# Patient Record
Sex: Female | Born: 1950 | Race: White | Hispanic: No | Marital: Married | Smoking: Never smoker
Health system: Southern US, Community
[De-identification: ages and names within clinical notes are randomized; demographics above are authoritative.]

## PROBLEM LIST (undated history)

## (undated) DIAGNOSIS — D332 Benign neoplasm of brain, unspecified: Secondary | ICD-10-CM

## (undated) DIAGNOSIS — I509 Heart failure, unspecified: Secondary | ICD-10-CM

## (undated) DIAGNOSIS — I252 Old myocardial infarction: Secondary | ICD-10-CM

## (undated) DIAGNOSIS — E119 Type 2 diabetes mellitus without complications: Secondary | ICD-10-CM

## (undated) DIAGNOSIS — F419 Anxiety disorder, unspecified: Secondary | ICD-10-CM

## (undated) DIAGNOSIS — F32A Depression, unspecified: Secondary | ICD-10-CM

## (undated) DIAGNOSIS — E559 Vitamin D deficiency, unspecified: Secondary | ICD-10-CM

## (undated) DIAGNOSIS — N183 Chronic kidney disease, stage 3 unspecified: Secondary | ICD-10-CM

## (undated) HISTORY — DX: Chronic kidney disease, stage 3 unspecified: N18.30

## (undated) HISTORY — PX: OTHER SURGICAL HISTORY: SHX169

## (undated) HISTORY — DX: Depression, unspecified: F32.A

## (undated) HISTORY — DX: Anxiety disorder, unspecified: F41.9

## (undated) HISTORY — DX: Old myocardial infarction: I25.2

## (undated) HISTORY — DX: Vitamin D deficiency, unspecified: E55.9

## (undated) HISTORY — DX: Type 2 diabetes mellitus without complications: E11.9

## (undated) HISTORY — DX: Heart failure, unspecified: I50.9

## (undated) HISTORY — PX: ABDOMINAL HYSTERECTOMY: SHX81

---

## 1987-08-01 HISTORY — PX: BRAIN SURGERY: SHX531

## 1998-11-23 ENCOUNTER — Encounter: Payer: Self-pay | Admitting: Emergency Medicine

## 1998-11-23 ENCOUNTER — Inpatient Hospital Stay (HOSPITAL_COMMUNITY): Admission: EM | Admit: 1998-11-23 | Discharge: 1998-11-26 | Payer: Self-pay | Admitting: Emergency Medicine

## 1998-11-25 ENCOUNTER — Encounter: Payer: Self-pay | Admitting: Neurology

## 1998-11-25 ENCOUNTER — Encounter: Payer: Self-pay | Admitting: Emergency Medicine

## 2016-03-24 ENCOUNTER — Emergency Department (HOSPITAL_COMMUNITY)
Admission: EM | Admit: 2016-03-24 | Discharge: 2016-03-24 | Disposition: A | Discharge status: Home / Self Care | Payer: Self-pay | Attending: Emergency Medicine | Admitting: Emergency Medicine

## 2016-03-24 ENCOUNTER — Encounter (HOSPITAL_COMMUNITY): Payer: Self-pay | Admitting: Emergency Medicine

## 2016-03-24 DIAGNOSIS — N764 Abscess of vulva: Secondary | ICD-10-CM | POA: Insufficient documentation

## 2016-03-24 DIAGNOSIS — L0291 Cutaneous abscess, unspecified: Secondary | ICD-10-CM

## 2016-03-24 MED ORDER — LIDOCAINE-EPINEPHRINE (PF) 1 %-1:200000 IJ SOLN
INTRAMUSCULAR | Status: AC
  Filled 2016-03-24: qty 30

## 2016-03-24 MED ORDER — CLINDAMYCIN HCL 150 MG PO CAPS: 300.0000 mg | ORAL_CAPSULE | Freq: Three times a day (TID) | ORAL | 0 refills | Status: DC

## 2016-03-24 MED ORDER — CLINDAMYCIN HCL 150 MG PO CAPS
300.0000 mg | ORAL_CAPSULE | Freq: Once | ORAL | Status: AC
  Administered 2016-03-24: 300 mg via ORAL
  Filled 2016-03-24: qty 2

## 2016-03-24 MED ORDER — TRAMADOL HCL 50 MG PO TABS
50.0000 mg | ORAL_TABLET | Freq: Four times a day (QID) | ORAL | 0 refills | Status: DC | PRN
Start: 2016-03-24 — End: 2020-02-17

## 2016-03-24 NOTE — ED Triage Notes (Signed)
PT c/o abscess to left side of labia x6 days with drainage from area x3 days. PT denies any fevers.

## 2016-03-24 NOTE — ED Notes (Signed)
Patient with no complaints at this time. Respirations even and unlabored. Skin warm/dry. Discharge instructions reviewed with patient at this time. Patient given opportunity to voice concerns/ask questions. Patient discharged at this time and left Emergency Department with steady gait.   

## 2016-03-24 NOTE — ED Provider Notes (Signed)
[facility] DEPT Provider Note   CSN: [id number] Arrival date & time: 03/24/16  [id number]     History   Chief Complaint Chief Complaint  Patient presents with  . Abscess    HPI Becky Boyle is a 65 y.o. female.  HPI   Becky Boyle is a 65 y.o. female who presents to the Emergency Department complaining of pain, redness and swelling of the left vulva for one week.  She states the area has been draining blood for several days.  She states it began as a small red area to the left of her rectum, but has not spread to her left vaginal region.  Pain is worse with walking.  She states that the drainage is heavy enough that she has to wear a sanitary pad.  Symptoms began several days after she shaved her pubic area.  She denies fever, chills, abdominal pain.   History reviewed. No pertinent past medical history.  There are no active problems to display for this patient.   Past Surgical History:  Procedure Laterality Date  . ABDOMINAL HYSTERECTOMY    . BRAIN SURGERY     tumor     OB History    Gravida Para Term Preterm AB Living             4   SAB TAB Ectopic Multiple Live Births                   Home Medications    Prior to Admission medications   Not on File    Family History History reviewed. No pertinent family history.  Social History Social History  Substance Use Topics  . Smoking status: Never Smoker  . Smokeless tobacco: Never Used  . Alcohol use No     Allergies   Codeine   Review of Systems Review of Systems  Constitutional: Negative for chills and fever.  Gastrointestinal: Negative for nausea and vomiting.  Genitourinary: Positive for vaginal pain (abscess).  Musculoskeletal: Negative for arthralgias and joint swelling.  Skin: Positive for color change.       Abscess   Hematological: Negative for adenopathy.  All other systems reviewed and are negative.    Physical Exam Updated Vital Signs BP 168/97 (BP Location: Right Arm)   Pulse  98   Temp 97.7 F (36.5 C) (Oral)   Resp 20   Ht 5\' 4"  (1.626 m)   Wt 74.8 kg   SpO2 99%   BMI 28.32 kg/m   Physical Exam  Constitutional: She is oriented to person, place, and time. She appears well-developed and well-nourished. No distress.  HENT:  Head: Normocephalic and atraumatic.  Cardiovascular: Normal rate, regular rhythm and normal heart sounds.   No murmur heard. Pulmonary/Chest: Effort normal and breath sounds normal. No respiratory distress.  Abdominal: Soft. She exhibits no distension. There is no tenderness. There is no guarding.  Genitourinary:  Genitourinary Comments: Abscess left vulva, moderate erythema and induration with small amt of bloody drainage  Neurological: She is alert and oriented to person, place, and time. She exhibits normal muscle tone. Coordination normal.  Skin: Skin is warm and dry.  Nursing note and vitals reviewed.    ED Treatments / Results  Labs (all labs ordered are listed, but only abnormal results are displayed) Labs Reviewed - No data to display  EKG  EKG Interpretation None       Radiology No results found.  Procedures Procedures (including critical care time)   INCISION AND DRAINAGE  Performed by: Hale Bogus Consent: Verbal consent obtained. Risks and benefits: risks, benefits and alternatives were discussed Type: abscess  Body area: left perineum  Anesthesia: local infiltration  Incision was made with a #11 scalpel.  Local anesthetic: lidocaine 1 % w/ epinephrine  Anesthetic total: 4 ml  Complexity: complex Blunt dissection to break up loculations  Drainage: purulent  Drainage amount: moderate   Packing material: 1/4 in iodoform gauze  Patient tolerance: Patient tolerated the procedure well with no immediate complications.    Medications Ordered in ED Medications - No data to display   Initial Impression / Assessment and Plan / ED Course  I have reviewed the triage vital signs and the  nursing notes.  Pertinent labs & imaging results that were available during my care of the patient were reviewed by me and considered in my medical decision making (see chart for details).  Clinical Course    Pt is well appearing, non-toxic.  Abscess of the left vulva onset after shaving.  Agrees to warm compresses, packing removal in 2 days   Pt also seen by Dr. Maryan Rued   Final Clinical Impressions(s) / ED Diagnoses   Final diagnoses:  Abscess    New Prescriptions New Prescriptions   No medications on file     Bufford Lope 03/26/16 2217    Blanchie Dessert, MD 03/27/16 2025

## 2016-03-24 NOTE — Discharge Instructions (Signed)
Warm water soaks 2-3 times a day.  Keep the area covered as long its draining.  Packing will need to be removed in 2 days.  Return here for recheck in 2 days if not improving

## 2016-03-24 NOTE — ED Notes (Signed)
Assisted PA with I&D of abscess.

## 2017-10-12 DIAGNOSIS — H524 Presbyopia: Secondary | ICD-10-CM | POA: Diagnosis not present

## 2017-10-22 DIAGNOSIS — H2513 Age-related nuclear cataract, bilateral: Secondary | ICD-10-CM | POA: Diagnosis not present

## 2017-12-17 DIAGNOSIS — H2513 Age-related nuclear cataract, bilateral: Secondary | ICD-10-CM | POA: Diagnosis not present

## 2018-01-10 DIAGNOSIS — H2513 Age-related nuclear cataract, bilateral: Secondary | ICD-10-CM | POA: Diagnosis not present

## 2018-01-10 DIAGNOSIS — H2511 Age-related nuclear cataract, right eye: Secondary | ICD-10-CM | POA: Diagnosis not present

## 2018-01-10 DIAGNOSIS — H2512 Age-related nuclear cataract, left eye: Secondary | ICD-10-CM | POA: Diagnosis not present

## 2018-01-11 DIAGNOSIS — H2512 Age-related nuclear cataract, left eye: Secondary | ICD-10-CM | POA: Diagnosis not present

## 2018-01-11 DIAGNOSIS — Z961 Presence of intraocular lens: Secondary | ICD-10-CM | POA: Diagnosis not present

## 2018-01-21 DIAGNOSIS — H0279 Other degenerative disorders of eyelid and periocular area: Secondary | ICD-10-CM | POA: Diagnosis not present

## 2018-01-21 DIAGNOSIS — H02831 Dermatochalasis of right upper eyelid: Secondary | ICD-10-CM | POA: Diagnosis not present

## 2018-01-21 DIAGNOSIS — G510 Bell's palsy: Secondary | ICD-10-CM | POA: Diagnosis not present

## 2018-01-21 DIAGNOSIS — H02834 Dermatochalasis of left upper eyelid: Secondary | ICD-10-CM | POA: Diagnosis not present

## 2018-01-21 DIAGNOSIS — H57813 Brow ptosis, bilateral: Secondary | ICD-10-CM | POA: Diagnosis not present

## 2018-01-21 DIAGNOSIS — H02423 Myogenic ptosis of bilateral eyelids: Secondary | ICD-10-CM | POA: Diagnosis not present

## 2018-01-21 DIAGNOSIS — H02413 Mechanical ptosis of bilateral eyelids: Secondary | ICD-10-CM | POA: Diagnosis not present

## 2018-01-21 DIAGNOSIS — H53483 Generalized contraction of visual field, bilateral: Secondary | ICD-10-CM | POA: Diagnosis not present

## 2018-01-24 DIAGNOSIS — H2512 Age-related nuclear cataract, left eye: Secondary | ICD-10-CM | POA: Diagnosis not present

## 2018-01-30 DIAGNOSIS — H2513 Age-related nuclear cataract, bilateral: Secondary | ICD-10-CM | POA: Diagnosis not present

## 2018-04-30 DIAGNOSIS — H53483 Generalized contraction of visual field, bilateral: Secondary | ICD-10-CM | POA: Diagnosis not present

## 2018-04-30 DIAGNOSIS — H0279 Other degenerative disorders of eyelid and periocular area: Secondary | ICD-10-CM | POA: Diagnosis not present

## 2018-04-30 DIAGNOSIS — G510 Bell's palsy: Secondary | ICD-10-CM | POA: Diagnosis not present

## 2018-04-30 DIAGNOSIS — H57813 Brow ptosis, bilateral: Secondary | ICD-10-CM | POA: Diagnosis not present

## 2018-04-30 DIAGNOSIS — G8918 Other acute postprocedural pain: Secondary | ICD-10-CM | POA: Diagnosis not present

## 2018-04-30 DIAGNOSIS — I69861 Other paralytic syndrome following other cerebrovascular disease affecting right dominant side: Secondary | ICD-10-CM | POA: Diagnosis not present

## 2018-04-30 DIAGNOSIS — H02413 Mechanical ptosis of bilateral eyelids: Secondary | ICD-10-CM | POA: Diagnosis not present

## 2018-04-30 DIAGNOSIS — H02422 Myogenic ptosis of left eyelid: Secondary | ICD-10-CM | POA: Diagnosis not present

## 2018-04-30 DIAGNOSIS — H02831 Dermatochalasis of right upper eyelid: Secondary | ICD-10-CM | POA: Diagnosis not present

## 2018-04-30 DIAGNOSIS — H02834 Dermatochalasis of left upper eyelid: Secondary | ICD-10-CM | POA: Diagnosis not present

## 2018-04-30 DIAGNOSIS — H02411 Mechanical ptosis of right eyelid: Secondary | ICD-10-CM | POA: Diagnosis not present

## 2018-04-30 DIAGNOSIS — H53453 Other localized visual field defect, bilateral: Secondary | ICD-10-CM | POA: Diagnosis not present

## 2019-07-01 ENCOUNTER — Other Ambulatory Visit: Payer: Self-pay | Admitting: Ophthalmology

## 2019-07-01 DIAGNOSIS — Z1231 Encounter for screening mammogram for malignant neoplasm of breast: Secondary | ICD-10-CM

## 2020-02-17 ENCOUNTER — Encounter (HOSPITAL_COMMUNITY)
Admission: EM | Disposition: A | Discharge status: Home / Self Care | Payer: Self-pay | Source: Home / Self Care | Attending: Cardiovascular Disease

## 2020-02-17 ENCOUNTER — Other Ambulatory Visit: Payer: Self-pay

## 2020-02-17 ENCOUNTER — Inpatient Hospital Stay (HOSPITAL_COMMUNITY)
Admission: EM | Admit: 2020-02-17 | Discharge: 2020-02-20 | DRG: 246 | Disposition: A | Discharge status: Home / Self Care | Payer: Medicare HMO | Attending: Cardiovascular Disease | Admitting: Cardiovascular Disease

## 2020-02-17 ENCOUNTER — Inpatient Hospital Stay: Payer: Self-pay

## 2020-02-17 ENCOUNTER — Emergency Department (HOSPITAL_COMMUNITY): Payer: Medicare HMO

## 2020-02-17 ENCOUNTER — Other Ambulatory Visit (HOSPITAL_COMMUNITY): Payer: Medicare HMO

## 2020-02-17 ENCOUNTER — Encounter (HOSPITAL_COMMUNITY): Payer: Self-pay | Admitting: Medical

## 2020-02-17 DIAGNOSIS — J9811 Atelectasis: Secondary | ICD-10-CM | POA: Diagnosis present

## 2020-02-17 DIAGNOSIS — Z79899 Other long term (current) drug therapy: Secondary | ICD-10-CM | POA: Diagnosis not present

## 2020-02-17 DIAGNOSIS — Z820 Family history of epilepsy and other diseases of the nervous system: Secondary | ICD-10-CM | POA: Diagnosis not present

## 2020-02-17 DIAGNOSIS — Z20822 Contact with and (suspected) exposure to covid-19: Secondary | ICD-10-CM | POA: Diagnosis not present

## 2020-02-17 DIAGNOSIS — R0602 Shortness of breath: Secondary | ICD-10-CM | POA: Diagnosis not present

## 2020-02-17 DIAGNOSIS — Z86011 Personal history of benign neoplasm of the brain: Secondary | ICD-10-CM

## 2020-02-17 DIAGNOSIS — I110 Hypertensive heart disease with heart failure: Secondary | ICD-10-CM | POA: Diagnosis not present

## 2020-02-17 DIAGNOSIS — I5021 Acute systolic (congestive) heart failure: Secondary | ICD-10-CM | POA: Diagnosis present

## 2020-02-17 DIAGNOSIS — I255 Ischemic cardiomyopathy: Secondary | ICD-10-CM | POA: Diagnosis present

## 2020-02-17 DIAGNOSIS — E785 Hyperlipidemia, unspecified: Secondary | ICD-10-CM | POA: Diagnosis present

## 2020-02-17 DIAGNOSIS — I214 Non-ST elevation (NSTEMI) myocardial infarction: Secondary | ICD-10-CM | POA: Diagnosis not present

## 2020-02-17 DIAGNOSIS — I5043 Acute on chronic combined systolic (congestive) and diastolic (congestive) heart failure: Secondary | ICD-10-CM

## 2020-02-17 DIAGNOSIS — Z955 Presence of coronary angioplasty implant and graft: Secondary | ICD-10-CM

## 2020-02-17 DIAGNOSIS — J80 Acute respiratory distress syndrome: Secondary | ICD-10-CM | POA: Diagnosis not present

## 2020-02-17 DIAGNOSIS — I5041 Acute combined systolic (congestive) and diastolic (congestive) heart failure: Secondary | ICD-10-CM | POA: Diagnosis not present

## 2020-02-17 DIAGNOSIS — I2510 Atherosclerotic heart disease of native coronary artery without angina pectoris: Secondary | ICD-10-CM | POA: Diagnosis present

## 2020-02-17 DIAGNOSIS — Z90710 Acquired absence of both cervix and uterus: Secondary | ICD-10-CM

## 2020-02-17 DIAGNOSIS — E1165 Type 2 diabetes mellitus with hyperglycemia: Secondary | ICD-10-CM | POA: Diagnosis present

## 2020-02-17 DIAGNOSIS — Z8249 Family history of ischemic heart disease and other diseases of the circulatory system: Secondary | ICD-10-CM | POA: Diagnosis not present

## 2020-02-17 DIAGNOSIS — Z79891 Long term (current) use of opiate analgesic: Secondary | ICD-10-CM

## 2020-02-17 DIAGNOSIS — I252 Old myocardial infarction: Secondary | ICD-10-CM | POA: Diagnosis present

## 2020-02-17 DIAGNOSIS — R079 Chest pain, unspecified: Secondary | ICD-10-CM | POA: Diagnosis not present

## 2020-02-17 DIAGNOSIS — I351 Nonrheumatic aortic (valve) insufficiency: Secondary | ICD-10-CM | POA: Diagnosis not present

## 2020-02-17 DIAGNOSIS — J90 Pleural effusion, not elsewhere classified: Secondary | ICD-10-CM | POA: Diagnosis not present

## 2020-02-17 DIAGNOSIS — R918 Other nonspecific abnormal finding of lung field: Secondary | ICD-10-CM | POA: Diagnosis not present

## 2020-02-17 DIAGNOSIS — Z833 Family history of diabetes mellitus: Secondary | ICD-10-CM

## 2020-02-17 DIAGNOSIS — I213 ST elevation (STEMI) myocardial infarction of unspecified site: Secondary | ICD-10-CM | POA: Diagnosis not present

## 2020-02-17 DIAGNOSIS — R0789 Other chest pain: Secondary | ICD-10-CM | POA: Diagnosis not present

## 2020-02-17 DIAGNOSIS — I340 Nonrheumatic mitral (valve) insufficiency: Secondary | ICD-10-CM | POA: Diagnosis not present

## 2020-02-17 DIAGNOSIS — I5042 Chronic combined systolic (congestive) and diastolic (congestive) heart failure: Secondary | ICD-10-CM | POA: Insufficient documentation

## 2020-02-17 HISTORY — DX: Benign neoplasm of brain, unspecified: D33.2

## 2020-02-17 HISTORY — PX: CORONARY STENT INTERVENTION: CATH118234

## 2020-02-17 HISTORY — PX: RIGHT/LEFT HEART CATH AND CORONARY ANGIOGRAPHY: CATH118266

## 2020-02-17 LAB — POCT I-STAT 7, (LYTES, BLD GAS, ICA,H+H)
Acid-Base Excess: 1 mmol/L (ref 0.0–2.0)
Bicarbonate: 25.2 mmol/L (ref 20.0–28.0)
Calcium, Ion: 1.2 mmol/L (ref 1.15–1.40)
HCT: 41 % (ref 36.0–46.0)
Hemoglobin: 13.9 g/dL (ref 12.0–15.0)
O2 Saturation: 92 %
Potassium: 3.7 mmol/L (ref 3.5–5.1)
Sodium: 142 mmol/L (ref 135–145)
TCO2: 26 mmol/L (ref 22–32)
pCO2 arterial: 39.7 mmHg (ref 32.0–48.0)
pH, Arterial: 7.411 (ref 7.350–7.450)
pO2, Arterial: 62 mmHg — ABNORMAL LOW (ref 83.0–108.0)

## 2020-02-17 LAB — CBC WITH DIFFERENTIAL/PLATELET
Abs Immature Granulocytes: 0.12 10*3/uL — ABNORMAL HIGH (ref 0.00–0.07)
Basophils Absolute: 0.1 10*3/uL (ref 0.0–0.1)
Basophils Relative: 0 %
Eosinophils Absolute: 0.1 10*3/uL (ref 0.0–0.5)
Eosinophils Relative: 1 %
HCT: 40.8 % (ref 36.0–46.0)
Hemoglobin: 12.9 g/dL (ref 12.0–15.0)
Immature Granulocytes: 1 %
Lymphocytes Relative: 8 %
Lymphs Abs: 1.1 10*3/uL (ref 0.7–4.0)
MCH: 29.3 pg (ref 26.0–34.0)
MCHC: 31.6 g/dL (ref 30.0–36.0)
MCV: 92.5 fL (ref 80.0–100.0)
Monocytes Absolute: 0.7 10*3/uL (ref 0.1–1.0)
Monocytes Relative: 5 %
Neutro Abs: 11.6 10*3/uL — ABNORMAL HIGH (ref 1.7–7.7)
Neutrophils Relative %: 85 %
Platelets: 237 10*3/uL (ref 150–400)
RBC: 4.41 MIL/uL (ref 3.87–5.11)
RDW: 13.1 % (ref 11.5–15.5)
WBC: 13.7 10*3/uL — ABNORMAL HIGH (ref 4.0–10.5)
nRBC: 0 % (ref 0.0–0.2)

## 2020-02-17 LAB — HEPARIN LEVEL (UNFRACTIONATED): Heparin Unfractionated: 0.27 IU/mL — ABNORMAL LOW (ref 0.30–0.70)

## 2020-02-17 LAB — COMPREHENSIVE METABOLIC PANEL
ALT: 54 U/L — ABNORMAL HIGH (ref 0–44)
AST: 52 U/L — ABNORMAL HIGH (ref 15–41)
Albumin: 3.5 g/dL (ref 3.5–5.0)
Alkaline Phosphatase: 84 U/L (ref 38–126)
Anion gap: 11 (ref 5–15)
BUN: 13 mg/dL (ref 8–23)
CO2: 21 mmol/L — ABNORMAL LOW (ref 22–32)
Calcium: 8.7 mg/dL — ABNORMAL LOW (ref 8.9–10.3)
Chloride: 107 mmol/L (ref 98–111)
Creatinine, Ser: 0.83 mg/dL (ref 0.44–1.00)
GFR calc Af Amer: >60 mL/min (ref >60)
GFR calc non Af Amer: >60 mL/min (ref >60)
Glucose, Bld: 251 mg/dL — ABNORMAL HIGH (ref 70–99)
Potassium: 3.8 mmol/L (ref 3.5–5.1)
Sodium: 139 mmol/L (ref 135–145)
Total Bilirubin: 1.1 mg/dL (ref 0.3–1.2)
Total Protein: 7.2 g/dL (ref 6.5–8.1)

## 2020-02-17 LAB — BRAIN NATRIURETIC PEPTIDE: B Natriuretic Peptide: 1502.6 pg/mL — ABNORMAL HIGH (ref 0.0–100.0)

## 2020-02-17 LAB — POCT I-STAT EG7
Acid-Base Excess: 1 mmol/L (ref 0.0–2.0)
Bicarbonate: 26.8 mmol/L (ref 20.0–28.0)
Calcium, Ion: 1.21 mmol/L (ref 1.15–1.40)
HCT: 42 % (ref 36.0–46.0)
Hemoglobin: 14.3 g/dL (ref 12.0–15.0)
O2 Saturation: 59 %
Potassium: 3.7 mmol/L (ref 3.5–5.1)
Sodium: 144 mmol/L (ref 135–145)
TCO2: 28 mmol/L (ref 22–32)
pCO2, Ven: 45.6 mmHg (ref 44.0–60.0)
pH, Ven: 7.378 (ref 7.250–7.430)
pO2, Ven: 32 mmHg (ref 32.0–45.0)

## 2020-02-17 LAB — HEMOGLOBIN A1C
Hgb A1c MFr Bld: 9.5 % — ABNORMAL HIGH (ref 4.8–5.6)
Mean Plasma Glucose: 225.95 mg/dL

## 2020-02-17 LAB — TROPONIN I (HIGH SENSITIVITY)
Troponin I (High Sensitivity): 209 ng/L (ref <18)
Troponin I (High Sensitivity): 291 ng/L (ref <18)
Troponin I (High Sensitivity): 343 ng/L (ref <18)
Troponin I (High Sensitivity): 560 ng/L (ref <18)

## 2020-02-17 LAB — LIPID PANEL
Cholesterol: 165 mg/dL (ref 0–200)
HDL: 34 mg/dL — ABNORMAL LOW (ref >40)
LDL Cholesterol: 95 mg/dL (ref 0–99)
Total CHOL/HDL Ratio: 4.9 RATIO
Triglycerides: 181 mg/dL — ABNORMAL HIGH (ref <150)
VLDL: 36 mg/dL (ref 0–40)

## 2020-02-17 LAB — APTT: aPTT: 27 seconds (ref 24–36)

## 2020-02-17 LAB — MRSA PCR SCREENING: MRSA by PCR: NEGATIVE

## 2020-02-17 LAB — GLUCOSE, CAPILLARY: Glucose-Capillary: 262 mg/dL — ABNORMAL HIGH (ref 70–99)

## 2020-02-17 LAB — TSH: TSH: 3.981 u[IU]/mL (ref 0.350–4.500)

## 2020-02-17 LAB — HIV ANTIBODY (ROUTINE TESTING W REFLEX): HIV Screen 4th Generation wRfx: NONREACTIVE

## 2020-02-17 LAB — PROTIME-INR
INR: 1.1 (ref 0.8–1.2)
Prothrombin Time: 13.3 seconds (ref 11.4–15.2)

## 2020-02-17 LAB — POCT ACTIVATED CLOTTING TIME: Activated Clotting Time: 400 seconds

## 2020-02-17 LAB — SARS CORONAVIRUS 2 BY RT PCR (HOSPITAL ORDER, PERFORMED IN ~~LOC~~ HOSPITAL LAB): SARS Coronavirus 2: NEGATIVE

## 2020-02-17 SURGERY — LEFT HEART CATH AND CORONARY ANGIOGRAPHY
Anesthesia: LOCAL

## 2020-02-17 SURGERY — RIGHT/LEFT HEART CATH AND CORONARY ANGIOGRAPHY
Anesthesia: LOCAL

## 2020-02-17 MED ORDER — SODIUM CHLORIDE 0.9 % IV SOLN: INTRAVENOUS | Status: DC

## 2020-02-17 MED ORDER — FUROSEMIDE 10 MG/ML IJ SOLN
40.0000 mg | Freq: Once | INTRAMUSCULAR | Status: AC
  Administered 2020-02-17: 40 mg via INTRAVENOUS
  Filled 2020-02-17: qty 4

## 2020-02-17 MED ORDER — FENTANYL CITRATE (PF) 100 MCG/2ML IJ SOLN
INTRAMUSCULAR | Status: DC | PRN
  Administered 2020-02-17: 25 ug via INTRAVENOUS

## 2020-02-17 MED ORDER — VERAPAMIL HCL 2.5 MG/ML IV SOLN
INTRAVENOUS | Status: AC
  Filled 2020-02-17: qty 2

## 2020-02-17 MED ORDER — NITROGLYCERIN 1 MG/10 ML FOR IR/CATH LAB
INTRA_ARTERIAL | Status: AC
  Filled 2020-02-17: qty 10

## 2020-02-17 MED ORDER — METOPROLOL TARTRATE 5 MG/5ML IV SOLN
5.0000 mg | Freq: Once | INTRAVENOUS | Status: AC
  Administered 2020-02-17: 5 mg via INTRAVENOUS
  Filled 2020-02-17: qty 5

## 2020-02-17 MED ORDER — TICAGRELOR 90 MG PO TABS
ORAL_TABLET | ORAL | Status: AC
  Filled 2020-02-17: qty 1

## 2020-02-17 MED ORDER — HEPARIN (PORCINE) 25000 UT/250ML-% IV SOLN
850.0000 [IU]/h | INTRAVENOUS | Status: DC
  Administered 2020-02-17: 850 [IU]/h via INTRAVENOUS
  Filled 2020-02-17: qty 250

## 2020-02-17 MED ORDER — FENTANYL CITRATE (PF) 100 MCG/2ML IJ SOLN
75.0000 ug | Freq: Once | INTRAMUSCULAR | Status: AC
  Administered 2020-02-17: 75 ug via INTRAVENOUS
  Filled 2020-02-17: qty 2

## 2020-02-17 MED ORDER — VERAPAMIL HCL 2.5 MG/ML IV SOLN
INTRAVENOUS | Status: DC | PRN
  Administered 2020-02-17: 10 mL via INTRA_ARTERIAL

## 2020-02-17 MED ORDER — IOHEXOL 350 MG/ML SOLN
INTRAVENOUS | Status: DC | PRN
  Administered 2020-02-17: 135 mL

## 2020-02-17 MED ORDER — CARVEDILOL 3.125 MG PO TABS
3.1250 mg | ORAL_TABLET | Freq: Two times a day (BID) | ORAL | Status: DC
  Administered 2020-02-18 – 2020-02-19 (×3): 3.125 mg via ORAL
  Filled 2020-02-17 (×3): qty 1

## 2020-02-17 MED ORDER — ATORVASTATIN CALCIUM 80 MG PO TABS
80.0000 mg | ORAL_TABLET | Freq: Every day | ORAL | Status: DC
  Administered 2020-02-17 – 2020-02-19 (×3): 80 mg via ORAL
  Filled 2020-02-17 (×3): qty 1

## 2020-02-17 MED ORDER — NITROGLYCERIN 1 MG/10 ML FOR IR/CATH LAB
INTRA_ARTERIAL | Status: DC | PRN
  Administered 2020-02-17: 200 ug via INTRA_ARTERIAL

## 2020-02-17 MED ORDER — IOHEXOL 350 MG/ML SOLN
100.0000 mL | Freq: Once | INTRAVENOUS | Status: AC | PRN
  Administered 2020-02-17: 75 mL via INTRAVENOUS

## 2020-02-17 MED ORDER — HEPARIN (PORCINE) IN NACL 1000-0.9 UT/500ML-% IV SOLN
INTRAVENOUS | Status: AC
  Filled 2020-02-17: qty 1000

## 2020-02-17 MED ORDER — HEPARIN (PORCINE) IN NACL 1000-0.9 UT/500ML-% IV SOLN
INTRAVENOUS | Status: DC | PRN
  Administered 2020-02-17 (×2): 500 mL

## 2020-02-17 MED ORDER — TIROFIBAN (AGGRASTAT) BOLUS VIA INFUSION
INTRAVENOUS | Status: DC | PRN
  Administered 2020-02-17: 1815 ug via INTRAVENOUS

## 2020-02-17 MED ORDER — ONDANSETRON HCL 4 MG/2ML IJ SOLN: 4.0000 mg | Freq: Four times a day (QID) | INTRAMUSCULAR | Status: DC | PRN

## 2020-02-17 MED ORDER — SODIUM CHLORIDE 0.9% FLUSH: 3.0000 mL | INTRAVENOUS | Status: DC | PRN

## 2020-02-17 MED ORDER — ALPRAZOLAM 0.25 MG PO TABS
0.2500 mg | ORAL_TABLET | Freq: Two times a day (BID) | ORAL | Status: DC | PRN
  Administered 2020-02-17: 0.25 mg via ORAL
  Filled 2020-02-17: qty 1

## 2020-02-17 MED ORDER — TIROFIBAN HCL IN NACL 5-0.9 MG/100ML-% IV SOLN
INTRAVENOUS | Status: AC
  Filled 2020-02-17: qty 100

## 2020-02-17 MED ORDER — MIDAZOLAM HCL 2 MG/2ML IJ SOLN
INTRAMUSCULAR | Status: AC
  Filled 2020-02-17: qty 2

## 2020-02-17 MED ORDER — ACETAMINOPHEN 325 MG PO TABS: 650.0000 mg | ORAL_TABLET | ORAL | Status: DC | PRN

## 2020-02-17 MED ORDER — CHLORHEXIDINE GLUCONATE CLOTH 2 % EX PADS
6.0000 | MEDICATED_PAD | Freq: Every day | CUTANEOUS | Status: DC
  Administered 2020-02-17 – 2020-02-20 (×4): 6 via TOPICAL

## 2020-02-17 MED ORDER — HEPARIN SODIUM (PORCINE) 1000 UNIT/ML IJ SOLN
INTRAMUSCULAR | Status: AC
  Filled 2020-02-17: qty 1

## 2020-02-17 MED ORDER — NITROGLYCERIN 0.4 MG SL SUBL: 0.4000 mg | SUBLINGUAL_TABLET | SUBLINGUAL | Status: DC | PRN

## 2020-02-17 MED ORDER — HEPARIN SODIUM (PORCINE) 1000 UNIT/ML IJ SOLN
INTRAMUSCULAR | Status: DC | PRN
  Administered 2020-02-17: 4000 [IU] via INTRAVENOUS
  Administered 2020-02-17: 6000 [IU] via INTRAVENOUS

## 2020-02-17 MED ORDER — SODIUM CHLORIDE 0.9% FLUSH
3.0000 mL | Freq: Two times a day (BID) | INTRAVENOUS | Status: DC
  Administered 2020-02-18: 3 mL via INTRAVENOUS

## 2020-02-17 MED ORDER — SODIUM CHLORIDE 0.9% FLUSH
10.0000 mL | Freq: Two times a day (BID) | INTRAVENOUS | Status: DC
  Administered 2020-02-17 – 2020-02-19 (×4): 10 mL

## 2020-02-17 MED ORDER — INSULIN ASPART 100 UNIT/ML ~~LOC~~ SOLN
0.0000 [IU] | Freq: Every day | SUBCUTANEOUS | Status: DC
  Administered 2020-02-17: 3 [IU] via SUBCUTANEOUS

## 2020-02-17 MED ORDER — HYDRALAZINE HCL 20 MG/ML IJ SOLN: 10.0000 mg | INTRAMUSCULAR | Status: AC | PRN

## 2020-02-17 MED ORDER — SODIUM CHLORIDE 0.9 % IV SOLN: 250.0000 mL | INTRAVENOUS | Status: DC | PRN

## 2020-02-17 MED ORDER — LABETALOL HCL 5 MG/ML IV SOLN: 10.0000 mg | INTRAVENOUS | Status: AC | PRN

## 2020-02-17 MED ORDER — TICAGRELOR 90 MG PO TABS
ORAL_TABLET | ORAL | Status: DC | PRN
  Administered 2020-02-17: 180 mg via ORAL

## 2020-02-17 MED ORDER — HEPARIN SODIUM (PORCINE) 5000 UNIT/ML IJ SOLN
4000.0000 [IU] | Freq: Once | INTRAMUSCULAR | Status: AC
  Administered 2020-02-17: 4000 [IU] via INTRAVENOUS
  Filled 2020-02-17: qty 1

## 2020-02-17 MED ORDER — TIROFIBAN HCL IN NACL 5-0.9 MG/100ML-% IV SOLN
INTRAVENOUS | Status: DC | PRN
  Administered 2020-02-17: 0.15 ug/kg/min via INTRAVENOUS

## 2020-02-17 MED ORDER — FENTANYL CITRATE (PF) 100 MCG/2ML IJ SOLN
INTRAMUSCULAR | Status: AC
  Filled 2020-02-17: qty 2

## 2020-02-17 MED ORDER — SODIUM CHLORIDE 0.9% FLUSH: 10.0000 mL | INTRAVENOUS | Status: DC | PRN

## 2020-02-17 MED ORDER — LIDOCAINE HCL (PF) 1 % IJ SOLN
INTRAMUSCULAR | Status: DC | PRN
  Administered 2020-02-17: 2 mL

## 2020-02-17 MED ORDER — ACETAMINOPHEN 325 MG PO TABS
650.0000 mg | ORAL_TABLET | ORAL | Status: DC | PRN
  Administered 2020-02-18 – 2020-02-20 (×3): 650 mg via ORAL
  Filled 2020-02-17 (×4): qty 2

## 2020-02-17 MED ORDER — NITROGLYCERIN IN D5W 200-5 MCG/ML-% IV SOLN
0.0000 ug/min | INTRAVENOUS | Status: DC
  Administered 2020-02-17: 5 ug/min via INTRAVENOUS
  Filled 2020-02-17: qty 250

## 2020-02-17 MED ORDER — ASPIRIN 81 MG PO CHEW
81.0000 mg | CHEWABLE_TABLET | Freq: Every day | ORAL | Status: DC
  Administered 2020-02-18 – 2020-02-20 (×3): 81 mg via ORAL
  Filled 2020-02-17 (×3): qty 1

## 2020-02-17 MED ORDER — SODIUM CHLORIDE 0.9% FLUSH
3.0000 mL | Freq: Two times a day (BID) | INTRAVENOUS | Status: DC
  Administered 2020-02-17 – 2020-02-19 (×4): 3 mL via INTRAVENOUS

## 2020-02-17 MED ORDER — TICAGRELOR 90 MG PO TABS
90.0000 mg | ORAL_TABLET | Freq: Two times a day (BID) | ORAL | Status: DC
  Administered 2020-02-18 – 2020-02-20 (×5): 90 mg via ORAL
  Filled 2020-02-17 (×5): qty 1

## 2020-02-17 MED ORDER — ACETAMINOPHEN 325 MG PO TABS
650.0000 mg | ORAL_TABLET | Freq: Once | ORAL | Status: AC
  Administered 2020-02-17: 650 mg via ORAL
  Filled 2020-02-17: qty 2

## 2020-02-17 MED ORDER — ASPIRIN 81 MG PO CHEW: 324.0000 mg | CHEWABLE_TABLET | Freq: Once | ORAL | Status: DC

## 2020-02-17 MED ORDER — LIDOCAINE HCL (PF) 1 % IJ SOLN
INTRAMUSCULAR | Status: AC
  Filled 2020-02-17: qty 30

## 2020-02-17 MED ORDER — ASPIRIN EC 81 MG PO TBEC: 81.0000 mg | DELAYED_RELEASE_TABLET | Freq: Every day | ORAL | Status: DC

## 2020-02-17 MED ORDER — MIDAZOLAM HCL 2 MG/2ML IJ SOLN
INTRAMUSCULAR | Status: DC | PRN
  Administered 2020-02-17: 2 mg via INTRAVENOUS

## 2020-02-17 MED ORDER — ORAL CARE MOUTH RINSE
15.0000 mL | Freq: Two times a day (BID) | OROMUCOSAL | Status: DC
  Administered 2020-02-17 – 2020-02-20 (×4): 15 mL via OROMUCOSAL

## 2020-02-17 MED ORDER — HEPARIN SODIUM (PORCINE) 5000 UNIT/ML IJ SOLN
5000.0000 [IU] | Freq: Three times a day (TID) | INTRAMUSCULAR | Status: DC
  Administered 2020-02-18 – 2020-02-20 (×7): 5000 [IU] via SUBCUTANEOUS
  Filled 2020-02-17 (×7): qty 1

## 2020-02-17 MED ORDER — INSULIN ASPART 100 UNIT/ML ~~LOC~~ SOLN
0.0000 [IU] | Freq: Three times a day (TID) | SUBCUTANEOUS | Status: DC
  Administered 2020-02-18: 2 [IU] via SUBCUTANEOUS

## 2020-02-17 SURGICAL SUPPLY — 19 items
BALLN SAPPHIRE 2.5X20 (BALLOONS) ×2
BALLN SAPPHIRE ~~LOC~~ 3.5X12 (BALLOONS) ×2 IMPLANT
BALLOON SAPPHIRE 2.5X20 (BALLOONS) ×1 IMPLANT
CATH 5FR JL3.5 JR4 ANG PIG MP (CATHETERS) ×2 IMPLANT
CATH LAUNCHER 6FR EBU3.5 (CATHETERS) ×2 IMPLANT
CATH SWAN DBL LUMAN 5F 110 (CATHETERS) ×2 IMPLANT
DEVICE RAD COMP TR BAND LRG (VASCULAR PRODUCTS) ×2 IMPLANT
GLIDESHEATH SLEND SS 6F .021 (SHEATH) ×4 IMPLANT
GUIDEWIRE INQWIRE 1.5J.035X260 (WIRE) ×1 IMPLANT
INQWIRE 1.5J .035X260CM (WIRE) ×2
KIT ENCORE 26 ADVANTAGE (KITS) ×4 IMPLANT
KIT HEART LEFT (KITS) ×2 IMPLANT
KIT HEMO VALVE WATCHDOG (MISCELLANEOUS) ×2 IMPLANT
PACK CARDIAC CATHETERIZATION (CUSTOM PROCEDURE TRAY) ×2 IMPLANT
STENT RESOLUTE ONYX 2.75X38 (Permanent Stent) ×2 IMPLANT
STENT RESOLUTE ONYX 3.0X26 (Permanent Stent) ×2 IMPLANT
TRANSDUCER W/STOPCOCK (MISCELLANEOUS) ×2 IMPLANT
TUBING CIL FLEX 10 FLL-RA (TUBING) ×2 IMPLANT
WIRE ASAHI PROWATER 180CM (WIRE) ×2 IMPLANT

## 2020-02-17 NOTE — Procedures (Signed)
Echo attempted. Patient in cath lab 02/17/20 at 4:22 pm.

## 2020-02-17 NOTE — H&P (Signed)
Cardiology Admission History and Physical:   Patient ID: Becky Boyle MRN: [id number]; DOB: May 03, 1951   Admission date: 02/17/2020  Primary Care Provider: Patient, No Pcp Per [facility] [place] Cardiologist: New to [facility]; Dr. Nicholes [facility] Electrophysiologist:  None   Chief Complaint:  Chest pain and SOB  Patient Profile:   Becky Boyle is a 69 y.o. female with no regular medical care and remote benign brain tumor s/p resection in 1998, who presents with new onset chest pain and SOB  History of Present Illness:   Becky Boyle was in her usual state of health until this morning around 4am when she was awoken from sleep with sudden onset SOB and back pain. She felt weak and diaphoretic. She noticed some improvement in her breathing when sitting upright. Her back pain proceeded to spread towards her shoulder and into her chest prompting her to activate EMS.   She has known heart disease or CHF history. She has not seen a medical provider in 20-30 years. She reports her grandmother died from an MI in her 45s, otherwise no family history of CAD/CHF. She has one son with poorly controlled HTN and another son with diabetes. She is does not take any prescription medication. She denies tobacco, ETOH, or illicit drug use. 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   At the time of this evaluation she reports ongoing chest pressure. She rates it as a 4/10. She also had a headache. Prior to today she has had several episodes of chest pain with associated SOB. Her husband reports over the past several weeks she has been experiencing chest pain/SOB with less and less activity - now occurring with walks to get the mail, vacuuming, or changing the sheets at home. She reports occasional dizziness but no syncope. She has had a couple episodes of PND prior to today. She has intermittent LE edema, present at the end of a work shift and generally  gone on waking in the morning. No complaints of palpitations, orthopnea, snoring, or daytime somnolence. She denies recent fever, URI, or problems with her bowels/bladder.    ED course: BP stable but above goal of <130/80, intermittently tachypneic, otherwise VSS. Labs notable for electrolytes wnl, Cr 0.83, glucose 225, WBC 13.7, HgbA1C 13.7, PLT 237, A1C 9.5, T cholesterol 165, LDL 95, HsTrop 209>291. EKG with sinus tachycardia, rate 127 bpm, baseline wander limits interpretation, c/f anteroseptal STE and inferolateral STD. Repeat EKG with rate 98 bpm, submm STE in anterior leads and submm STD in inferolateral leads. CXR with mild pulmonary edema with scattered atelectasis. CTA Chest without PE and again with pulmonary edema with small to moderate pleural effusions. Patient was given tylenol and fentanyl in the ED for pain, metoprolol tartrate 5mg  IV for BP management, and started on a heparin gtt and nitro gtt for possible ACS. Cardiology asked to evaluate.    Past Medical History:  Diagnosis Date  . Benign brain tumor [facility])     Past Surgical History:  Procedure Laterality Date  . ABDOMINAL HYSTERECTOMY    . BRAIN SURGERY  1989   tumor      Medications Prior to Admission: Prior to Admission medications   Medication Sig Start Date End Date Taking? Authorizing Provider  Pseudoeph-Doxylamine-DM-APAP (DAYQUIL/NYQUIL COLD/FLU RELIEF PO) Take 2 capsules by mouth every 6 (six) hours as needed (cold / congestion symptoms).   Yes [provider]  clindamycin (CLEOCIN) 150 MG capsule Take 2 capsules (300  mg total) by mouth 3 (three) times daily. For 7 days Patient not taking: Reported on 02/17/2020 03/24/16   Triplett, Tammy, PA-C  traMADol (ULTRAM) 50 MG tablet Take 1 tablet (50 mg total) by mouth every 6 (six) hours as needed. Patient not taking: Reported on 02/17/2020 03/24/16   Kem Parkinson, PA-C     Allergies:    Allergies  Allergen Reactions  . Latex Anaphylaxis  . Penicillins  Hives  . Codeine Rash  . Tape Rash    Social History:   Social History   Socioeconomic History  . Marital status: Married    Spouse name: Not on file  . Number of children: Not on file  . Years of education: Not on file  . Highest education level: Not on file  Occupational History  . Not on file  Tobacco Use  . Smoking status: Never Smoker  . Smokeless tobacco: Never Used  Substance and Sexual Activity  . Alcohol use: No  . Drug use: No  . Sexual activity: Not on file  Other Topics Concern  . Not on file  Social History Narrative  . Not on file   Social Determinants of Health   Financial Resource Strain:   . Difficulty of Paying Living Expenses:   Food Insecurity:   . Worried About Charity fundraiser in the Last Year:   . Arboriculturist in the Last Year:   Transportation Needs:   . Film/video editor (Medical):   Marland Kitchen Lack of Transportation (Non-Medical):   Physical Activity:   . Days of Exercise per Week:   . Minutes of Exercise per Session:   Stress:   . Feeling of Stress :   Social Connections:   . Frequency of Communication with Friends and Family:   . Frequency of Social Gatherings with Friends and Family:   . Attends Religious Services:   . Active Member of Clubs or Organizations:   . Attends Archivist Meetings:   Marland Kitchen Marital Status:   Intimate Partner Violence:   . Fear of Current or Ex-Partner:   . Emotionally Abused:   Marland Kitchen Physically Abused:   . Sexually Abused:     Family History:   The patient's family history includes Alzheimer's disease in her mother; Cancer in her father; Diabetes in her son; High blood pressure in her son.    ROS:  Please see the history of present illness.  All other ROS reviewed and negative.     Physical Exam/Data:   Vitals:   02/17/20 1200 02/17/20 1230 02/17/20 1300 02/17/20 1330  BP: 135/80 129/88 138/87 (!) 155/97  Pulse: 78 80 80 91  Resp: 20 (!) 22 19 16   Temp:      TempSrc:      SpO2: 99% 99%  97% 99%  Weight:      Height:       No intake or output data in the 24 hours ending 02/17/20 1433 Last 3 Weights 02/17/2020 03/24/2016  Weight (lbs) 160 lb 165 lb  Weight (kg) 72.576 kg 74.844 kg     Body mass index is 27.46 kg/m.  General:  Well nourished, well developed, in no acute distress HEENT: sclera anicteric Neck: no JVD Endocrine:  No thryomegaly Vascular: No carotid bruits; distal pulses 2+ bilaterally, varicose and spider veins noted on extremities  Cardiac:  normal S1, S2; RRR; no murmurs, rubs, or gallops Lungs:  Crackles at lung bases bilaterally without wheezes or rhochi Abd: soft, obese, nontender,  no hepatomegaly  Ext: no edema Musculoskeletal:  No deformities, BUE and BLE strength normal and equal Skin: warm and dry  Neuro:  CNs 2-12 intact, no focal abnormalities noted Psych:  Normal affect    EKG:  sinus tachycardia, rate 127 bpm, baseline wander limits interpretation, c/f anteroseptal STE and inferolateral STD. Repeat EKG with rate 98 bpm, submm STE in anterior leads and submm STD in inferolateral leads.   Relevant CV Studies: None  Laboratory Data:  High Sensitivity Troponin:   Recent Labs  Lab 02/17/20 0718 02/17/20 0946  TROPONINIHS 209* 291*      Chemistry Recent Labs  Lab 02/17/20 0718  NA 139  K 3.8  CL 107  CO2 21*  GLUCOSE 251*  BUN 13  CREATININE 0.83  CALCIUM 8.7*  GFRNONAA >60  GFRAA >60  ANIONGAP 11    Recent Labs  Lab 02/17/20 0718  PROT 7.2  ALBUMIN 3.5  AST 52*  ALT 54*  ALKPHOS 84  BILITOT 1.1   Hematology Recent Labs  Lab 02/17/20 0718  WBC 13.7*  RBC 4.41  HGB 12.9  HCT 40.8  MCV 92.5  MCH 29.3  MCHC 31.6  RDW 13.1  PLT 237   BNPNo results for input(s): BNP, PROBNP in the last 168 hours.  DDimer No results for input(s): DDIMER in the last 168 hours.   Radiology/Studies:  CT Angio Chest PE W and/or Wo Contrast  Result Date: 02/17/2020 CLINICAL DATA:  Shortness of breath, chest pain EXAM: CT  ANGIOGRAPHY CHEST WITH CONTRAST TECHNIQUE: Multidetector CT imaging of the chest was performed using the standard protocol during bolus administration of intravenous contrast. Multiplanar CT image reconstructions and MIPs were obtained to evaluate the vascular anatomy. CONTRAST:  18mL OMNIPAQUE IOHEXOL 350 MG/ML SOLN COMPARISON:  None. FINDINGS: Cardiovascular: Satisfactory opacification of the pulmonary arteries to the segmental level. No evidence of pulmonary embolism. Normal heart size. No pericardial effusion. Mediastinum/Nodes: There are no enlarged lymph nodes identified. Lungs/Pleura: Small to moderate pleural effusions, right larger than left. Interlobular septal and peribronchovascular thickening. Patchy ground-glass density likely reflecting a combination of atelectasis due to expiratory imaging and edema. Bibasilar atelectasis. Few scattered small nodular opacities. Upper Abdomen: No acute abnormality. Musculoskeletal: Chronic mild compression deformity of T3 and T4. Review of the MIP images confirms the above findings. IMPRESSION: Findings likely reflect pulmonary edema with small to moderate pleural effusions and mild cardiomegaly. Few scattered small nodular opacities measuring up to 4 mm. No follow-up needed if patient is low-risk (and has no known or suspected primary neoplasm). Non-contrast chest CT can be considered in 12 months if patient is high-risk. This recommendation follows the consensus statement: Guidelines for Management of Incidental Pulmonary Nodules Detected on CT Images: From the Fleischner Society 2017; Radiology 2017; 284:228-243. Electronically Signed   By: Macy Mis M.D.   On: 02/17/2020 09:15   DG Chest Port 1 View  Result Date: 02/17/2020 CLINICAL DATA:  Shortness of breath EXAM: PORTABLE CHEST 1 VIEW COMPARISON:  None. FINDINGS: The cardiomediastinal silhouette is mildly enlarged. Likely trace bilateral pleural effusions. No pneumothorax. Biapical pleuroparenchymal  scarring. Diffuse interstitial prominence with perihilar vascular prominence. Bibasilar heterogeneous opacities. Degenerative changes of the thoracic spine. Visualized abdomen is unremarkable. IMPRESSION: 1. Constellation of findings are favored to reflect mild pulmonary edema with scattered atelectasis. Electronically Signed   By: Valentino Saxon MD   On: 02/17/2020 07:59       TIMI Risk Score for Unstable Angina or Non-ST Elevation MI:   The  patient's TIMI risk score is 5, which indicates a 26% risk of all cause mortality, new or recurrent myocardial infarction or need for urgent revascularization in the next 14 days.      Assessment and Plan:   1. Chest pain and elevated troponins in patient without known CAD: patient presented with SOB and back pain which awoke her from sleep. Prior to this she had complaints of exertional chest pain/DOE for the past several weeks to months. Husband feels activity had declined given progressive symptoms. EKG on arrival with c/f anteroseptal STE, though baseline wander limits interpretation. Repeat EKG with submm STE in V2-3 with inferolateral STD. HsTrop 209>291. CXR and CTA Chest suggested pulmonary edema with mild-moderate pleural effusions. Risk factors for CAD include unmanaged HTN, unmanaged DM type 2, unmanaged HLD, and obesity. Suspect she has some underlying CAD, as well as a CHF component given pulmonary edema/pleural effusions.  - Will repeat HsTrop now - Will check BNP  - Will give a dose of IV lasix 40mg  x1 and monitor for response - Will check an echocardiogram to evaluate LV function, wall motion, and valves function - She would benefit from an ischemic evaluation - will plan for LHC, timing TBD (she received IV contrast today for CTA chest, though with ongoing chest pain, would prefer to do sooner rather than later) - Will start aspirin 81mg  daily, atorvastatin 80mg  daily, and carvedilol 3.125mg  BID  - Continue hepatin gtt and nitro gtt  2.  HTN: new diagnosis - BP has been persistently elevated since arrival. Not on medications at home. - Will start carvedilol 3.125mg  BID and monitor for response  3. HLD: LDL 95, triglycerides 181 - Will start atorvastatin 80mg  daily  4. New onset DM type 2: A1C 9.5 this admission.  - Will place consult to DM coordinator  - ISS while admitted  Severity of Illness: The appropriate patient status for this patient is INPATIENT. Inpatient status is judged to be reasonable and necessary in order to provide the required intensity of service to ensure the patient's safety. The patient's presenting symptoms, physical exam findings, and initial radiographic and laboratory data in the context of their chronic comorbidities is felt to place them at high risk for further clinical deterioration. Furthermore, it is not anticipated that the patient will be medically stable for discharge from the hospital within 2 midnights of admission. The following factors support the patient status of inpatient.   " The patient's presenting symptoms include chest pain, SOB. " The worrisome physical exam findings include crackles at lung bases bilaterally. " The initial radiographic and laboratory data are worrisome because of elevated HsTroponin, EKG with c/f ischemia, CXT/CTA Chest with pulmonary edema/pleural effusions. " The chronic co-morbidities include unmanaged HTN, unmanaged HLD, unmanaged DM type 2.   * I certify that at the point of admission it is my clinical judgment that the patient will require inpatient hospital care spanning beyond 2 midnights from the point of admission due to high intensity of service, high risk for further deterioration and high frequency of surveillance required.*    For questions or updates, please contact [facility] [place] Please consult [email or web address] for contact info under     Signed, Abigail Butts, PA-C  02/17/2020 2:33 PM

## 2020-02-17 NOTE — Progress Notes (Signed)
Nurse paged reporting patient is anxious and feeling overwhelmed with new diagnoses, possibly driving up BP - will try low dose Xanax PRN. She also inquired about plan for heparin as order still in place. Patient is s/p PCI. D/w Dr. Sallyanne Kuster - will initiate DVT ppx in AM. Melina Copa PA-C

## 2020-02-17 NOTE — Consult Note (Signed)
Advanced Heart Failure Team Consult Note   Primary Physician: Patient, No Pcp Per PCP-Cardiologist:  No primary care provider on file.  Reason for Consultation: CHF  HPI:    Becky Boyle is seen today for evaluation of CHF at the request of Dr. Irish Lack.   69 y.o. with history of remote brain tumor with resection (1998) was admitted today with chest pain and dyspnea.  She has not seen a doctor for years and takes no medications.  No ETOH or smoking.  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  She has sons with HTN and diabetes.  For several days, she noted mild chest tightness with moderate exertion.  Last night around 4 am, she woke up short of breath and had to sit on the side of the bed.  She had pain radiating from mid-back to chest.  She was weak and diaphoretic.  She came to the ER, initial ECG was concerning for slight anterior ST elevation, but repeat ECGs showed nonspecific anterior ST changes and possible old anterior MI.  CTA chest was done to rule out dissection, showing no PE or dissection, mild-moderate pleural effusions, and pulmonary edema.  HS-TnI 209 => 291 => 343, BNP 1503.  Main complaint in ER was dyspnea.  She got a bolus of IV Lasix.  Decision was made to take to cath lab.   RHC/LHC: mean RA 9, PA 43/16 mean 30, mean PCWP 20, CI 2.3.  The RCA was occluded proximally with collaterals.  There was 60% stenosis in a large PLOM.  The LAD had severe, extensive proximal disease up to 95%.  EF appears in the 25-30% range by hand injection.   Dr. Irish Lack asked me to review the case.  Cardiac index is relatively preserved so I do not think mechanical support is needed.  We will need further diuresis. We discussed percutaneous intervention versus CABG.  We elected PCI as I suspect there has been an event involving the LAD within the last few days, the RCA is well-collateralized, and the PLOM disease is not likely to be hemodynamically significant.  Patient  had overlapping DES to LAD with good result.    Review of Systems: All systems reviewed and negative except as per HPI.   Home Medications Prior to Admission medications   Medication Sig Start Date End Date Taking? Authorizing Provider  Pseudoeph-Doxylamine-DM-APAP (DAYQUIL/NYQUIL COLD/FLU RELIEF PO) Take 2 capsules by mouth every 6 (six) hours as needed (cold / congestion symptoms).   Yes [provider]    Past Medical History: Past Medical History:  Diagnosis Date  . Benign brain tumor [facility])     Past Surgical History: Past Surgical History:  Procedure Laterality Date  . ABDOMINAL HYSTERECTOMY    . BRAIN SURGERY  1989   tumor     Family History: Family History  Problem Relation Age of Onset  . Alzheimer's disease Mother   . Cancer Father   . Diabetes Son   . High blood pressure Son     Social History: Social History   Socioeconomic History  . Marital status: Married    Spouse name: Not on file  . Number of children: Not on file  . Years of education: Not on file  . Highest education level: Not on file  Occupational History  . Not on file  Tobacco Use  . Smoking status: Never Smoker  . Smokeless tobacco: Never Used  Substance and Sexual Activity  . Alcohol use:  No  . Drug use: No  . Sexual activity: Not on file  Other Topics Concern  . Not on file  Social History Narrative  . Not on file   Social Determinants of Health   Financial Resource Strain:   . Difficulty of Paying Living Expenses:   Food Insecurity:   . Worried About Charity fundraiser in the Last Year:   . Arboriculturist in the Last Year:   Transportation Needs:   . Film/video editor (Medical):   Marland Kitchen Lack of Transportation (Non-Medical):   Physical Activity:   . Days of Exercise per Week:   . Minutes of Exercise per Session:   Stress:   . Feeling of Stress :   Social Connections:   . Frequency of Communication with Friends and Family:   . Frequency of Social Gatherings  with Friends and Family:   . Attends Religious Services:   . Active Member of Clubs or Organizations:   . Attends Archivist Meetings:   Marland Kitchen Marital Status:     Allergies:  Allergies  Allergen Reactions  . Latex Anaphylaxis  . Penicillins Hives  . Codeine Rash  . Tape Rash    Objective:    Vital Signs:   Temp:  [97.7 F (36.5 C)-98 F (36.7 C)] 98 F (36.7 C) (07/20 1039) Pulse Rate:  [0-114] 0 (07/20 1747) Resp:  [0-52] 8 (07/20 1747) BP: (129-190)/(78-129) 172/116 (07/20 1738) SpO2:  [0 %-100 %] 0 % (07/20 1747) Weight:  [72.6 kg] 72.6 kg (07/20 0714)    Weight change: Filed Weights   02/17/20 0714  Weight: 72.6 kg    Intake/Output:   Intake/Output Summary (Last 24 hours) at 02/17/2020 1751 Last data filed at 02/17/2020 1737 Gross per 24 hour  Intake --  Output 600 ml  Net -600 ml      Physical Exam    General:  Well appearing. No resp difficulty HEENT: normal Neck: supple. JVP 12 cm. Carotids 2+ bilat; no bruits. No lymphadenopathy or thyromegaly appreciated. Cor: PMI nondisplaced. Regular rate & rhythm. No rubs, gallops or murmurs. Lungs: Decreased BS at bases.  Abdomen: soft, nontender, nondistended. No hepatosplenomegaly. No bruits or masses. Good bowel sounds. Extremities: no cyanosis, clubbing, rash. 1+ ankle edema.  Neuro: alert & orientedx3, cranial nerves grossly intact. moves all 4 extremities w/o difficulty. Affect pleasant   Telemetry   NSR in 80s (personally reviewed)  EKG    NSR, nonspecific anterior ST changes, poor RWP (personally reviewed)  Labs   Basic Metabolic Panel: Recent Labs  Lab 02/17/20 0718  NA 139  K 3.8  CL 107  CO2 21*  GLUCOSE 251*  BUN 13  CREATININE 0.83  CALCIUM 8.7*    Liver Function Tests: Recent Labs  Lab 02/17/20 0718  AST 52*  ALT 54*  ALKPHOS 84  BILITOT 1.1  PROT 7.2  ALBUMIN 3.5   No results for input(s): LIPASE, AMYLASE in the last 168 hours. No results for input(s):  AMMONIA in the last 168 hours.  CBC: Recent Labs  Lab 02/17/20 0718  WBC 13.7*  NEUTROABS 11.6*  HGB 12.9  HCT 40.8  MCV 92.5  PLT 237    Cardiac Enzymes: No results for input(s): CKTOTAL, CKMB, CKMBINDEX, TROPONINI in the last 168 hours.  BNP: BNP (last 3 results) Recent Labs    02/17/20 1528  BNP 1,502.6*    ProBNP (last 3 results) No results for input(s): PROBNP in the last 8760 hours.  CBG: No results for input(s): GLUCAP in the last 168 hours.  Coagulation Studies: Recent Labs    02/17/20 0718  LABPROT 13.3  INR 1.1     Imaging   CT Angio Chest PE W and/or Wo Contrast  Result Date: 02/17/2020 CLINICAL DATA:  Shortness of breath, chest pain EXAM: CT ANGIOGRAPHY CHEST WITH CONTRAST TECHNIQUE: Multidetector CT imaging of the chest was performed using the standard protocol during bolus administration of intravenous contrast. Multiplanar CT image reconstructions and MIPs were obtained to evaluate the vascular anatomy. CONTRAST:  46mL OMNIPAQUE IOHEXOL 350 MG/ML SOLN COMPARISON:  None. FINDINGS: Cardiovascular: Satisfactory opacification of the pulmonary arteries to the segmental level. No evidence of pulmonary embolism. Normal heart size. No pericardial effusion. Mediastinum/Nodes: There are no enlarged lymph nodes identified. Lungs/Pleura: Small to moderate pleural effusions, right larger than left. Interlobular septal and peribronchovascular thickening. Patchy ground-glass density likely reflecting a combination of atelectasis due to expiratory imaging and edema. Bibasilar atelectasis. Few scattered small nodular opacities. Upper Abdomen: No acute abnormality. Musculoskeletal: Chronic mild compression deformity of T3 and T4. Review of the MIP images confirms the above findings. IMPRESSION: Findings likely reflect pulmonary edema with small to moderate pleural effusions and mild cardiomegaly. Few scattered small nodular opacities measuring up to 4 mm. No follow-up  needed if patient is low-risk (and has no known or suspected primary neoplasm). Non-contrast chest CT can be considered in 12 months if patient is high-risk. This recommendation follows the consensus statement: Guidelines for Management of Incidental Pulmonary Nodules Detected on CT Images: From the Fleischner Society 2017; Radiology 2017; 284:228-243. Electronically Signed   By: Macy Mis M.D.   On: 02/17/2020 09:15   DG Chest Port 1 View  Result Date: 02/17/2020 CLINICAL DATA:  Shortness of breath EXAM: PORTABLE CHEST 1 VIEW COMPARISON:  None. FINDINGS: The cardiomediastinal silhouette is mildly enlarged. Likely trace bilateral pleural effusions. No pneumothorax. Biapical pleuroparenchymal scarring. Diffuse interstitial prominence with perihilar vascular prominence. Bibasilar heterogeneous opacities. Degenerative changes of the thoracic spine. Visualized abdomen is unremarkable. IMPRESSION: 1. Constellation of findings are favored to reflect mild pulmonary edema with scattered atelectasis. Electronically Signed   By: Valentino Saxon MD   On: 02/17/2020 07:59      Medications:     Current Medications: . [MAR Hold] aspirin  324 mg Oral Once  . [MAR Hold] sodium chloride flush  3 mL Intravenous Q12H     Infusions: . sodium chloride 10 mL/hr at 02/17/20 1132  . sodium chloride    . heparin Stopped (02/17/20 1618)  . nitroGLYCERIN 5 mcg/min (02/17/20 0954)       Assessment/Plan   1. CAD: NSTEMI, HS-TnI currently not markedly high.  I suspect there was a plaque rupture event involving the LAD perhaps a couple of days ago and she has developed CHF since that time.  Now s/p overlapping DES to LAD.  There is a chronically occluded RCA with collaterals and moderate stenosis in PLOM which is unlikely to be hemodynamically significant.  - Continue ASA, ticagrelor.  - Statin.  2. Acute systolic CHF: Ischemic cardiomyopathy.  EF 25-30% range on LV-gram.  Severe dyspnea at admission.   Elevated filling pressures on RHC but relatively preserved cardiac output.  BP elevated. Improving with IV Lasix.  - Will place PICC to follow CVP and co-ox in ICU. - Will give another dose of Lasix 80 mg IV x 1 later this evening.  - NTG gtt to keep SBP < 140.  - If creatinine remains  stable, likely add Entresto/spironolactone soon.  - Needs formal echo.  3. Diabetes: New diagnosis.  Will need SSI for now.  Eventual metformin, SGLT2 inhibitor.   Length of Stay: 0  Loralie Champagne, MD  02/17/2020, 5:51 PM  Advanced Heart Failure Team Pager [phone] (M-F; 7a - 4p)  Please contact [facility] for night-coverage after hours (4p -7a ) and weekends on [email or web address]

## 2020-02-17 NOTE — Interval H&P Note (Signed)
Cath Lab Visit (complete for each Cath Lab visit)  Clinical Evaluation Leading to the Procedure:   ACS: Yes.    Non-ACS:    Anginal Classification: CCS IV  Anti-ischemic medical therapy: Minimal Therapy (1 class of medications)  Non-Invasive Test Results: No non-invasive testing performed  Prior CABG: No previous CABG      History and Physical Interval Note:  02/17/2020 4:20 PM  Becky Boyle  has presented today for surgery, with the diagnosis of nstemi.  The various methods of treatment have been discussed with the patient and family. After consideration of risks, benefits and other options for treatment, the patient has consented to  Procedure(s): LEFT HEART CATH AND CORONARY ANGIOGRAPHY (N/A) as a surgical intervention.  The patient's history has been reviewed, patient examined, no change in status, stable for surgery.  I have reviewed the patient's chart and labs.  Questions were answered to the patient's satisfaction.     Larae Grooms

## 2020-02-17 NOTE — ED Notes (Signed)
Informed consent for procedure signed by pt. Placed at bedside.

## 2020-02-17 NOTE — Progress Notes (Signed)
Patient came in as Code Stemi. Her husband arrived to be with her. Staff very busy with her and chaplain not able to visit at that time.  Code stemi cancelled shortly after.  Patient still not available at the time.    02/17/20 0700  Clinical Encounter Type  Visited With Patient not available  Referral From Other (Comment) (Code Stemi)  Consult/Referral To [place], Chaplain

## 2020-02-17 NOTE — Progress Notes (Signed)
Peripherally Inserted Central Catheter Placement  The IV Nurse has discussed with the patient and/or persons authorized to consent for the patient, the purpose of this procedure and the potential benefits and risks involved with this procedure.  The benefits include less needle sticks, lab draws from the catheter, and the patient may be discharged home with the catheter. Risks include, but not limited to, infection, bleeding, blood clot (thrombus formation), and puncture of an artery; nerve damage and irregular heartbeat and possibility to perform a PICC exchange if needed/ordered by physician.  Alternatives to this procedure were also discussed.  Bard Power PICC patient education guide, fact sheet on infection prevention and patient information card has been provided to patient /or left at bedside.    PICC Placement Documentation  PICC Triple Lumen 07/86/75 PICC Left Cephalic 44 cm 1 cm (Active)  Indication for Insertion or Continuance of Line Vasoactive infusions 02/17/20 2133  Exposed Catheter (cm) 1 cm 02/17/20 2133  Site Assessment Clean;Dry;Intact 02/17/20 2133  Lumen #1 Status Blood return noted;Flushed;Saline locked 02/17/20 2133  Lumen #2 Status Blood return noted;Flushed;Saline locked 02/17/20 2133  Lumen #3 Status Blood return noted;Flushed;Saline locked 02/17/20 2133  Dressing Type Transparent;Occlusive;Securing device 02/17/20 2133  Dressing Status Clean;Dry;Intact;Antimicrobial disc in place 02/17/20 2133  Line Adjustment (NICU/IV Team Only) No 02/17/20 2133  Dressing Intervention New dressing 02/17/20 2133  Dressing Change Due 02/24/20 02/17/20 2133       Edson Snowball 02/17/2020, 9:54 PM

## 2020-02-17 NOTE — Progress Notes (Addendum)
ANTICOAGULATION CONSULT NOTE - Initial Consult  Pharmacy Consult for heparin dosing. Indication: chest pain/ACS  Allergies  Allergen Reactions   Codeine Rash    Patient Measurements: Height: 5\' 4"  (162.6 cm) Weight: 72.6 kg (160 lb) IBW/kg (Calculated) : 54.7 Heparin Dosing Weight: 69.6 kg   Vital Signs: Temp: 97.7 F (36.5 C) (07/20 0726) Temp Source: Oral (07/20 0726) BP: 158/87 (07/20 0726) Pulse Rate: 100 (07/20 0726)  Labs: Recent Labs    02/17/20 0718  HGB 12.9  HCT 40.8  PLT 237    CrCl cannot be calculated (No successful lab value found.).   Medical History: No past medical history on file.  Assessment: 69 y.o. female admitted with chest pain, SOB, pale, and diaphoretic. Initial EKG showed subtle ST elevation but repeat EKG showed improvement. Treating for ACS but evaluating for possible PE. Not on anticoag PTA, no overt bleeding documented. CBC wnl, patient already given 4000 units heparin bolus.   Goal of Therapy:  Heparin level 0.3-0.7 units/ml Monitor platelets by anticoagulation protocol: Yes   Plan:  Start heparin infusion at 850 units/hr Check anti-Xa level in 6 hours and daily while on heparin Continue to monitor H&H and platelets F/u CTA chest   Claudina Lick, PharmD PGY1 Acute Care Pharmacy Resident 02/17/2020 7:55 AM  Please check [email or web address] for unit-specific pharmacy phone numbers.

## 2020-02-17 NOTE — Progress Notes (Signed)
Consent obtained for bedside PICC placement. Arrived to begin procedure. Pt states "I am anxious and feel hot like I did before this happened." Melynn Mueller RN contacting MD. Awaiting further instructions once patient situation resolves and she agrees to proceed with PICC procedure.

## 2020-02-17 NOTE — ED Triage Notes (Signed)
BIB [facility] c/o chest pain starting at 4 a.m w/ SOB, pale, diaphoretic. EKG showing ST elevation per EMS

## 2020-02-17 NOTE — Progress Notes (Signed)
ANTICOAGULATION CONSULT NOTE - Follow Up Consult  Pharmacy Consult for IV Heparin Indication: chest pain/ACS  Allergies  Allergen Reactions  . Latex Anaphylaxis  . Penicillins Hives  . Codeine Rash  . Tape Rash    Patient Measurements: Height: 5\' 4"  (162.6 cm) Weight: 72.6 kg (160 lb) IBW/kg (Calculated) : 54.7 Heparin Dosing Weight: 69.6 kg   Vital Signs: Temp: 98 F (36.7 C) (07/20 1039) Temp Source: Oral (07/20 1039) BP: 153/97 (07/20 1500) Pulse Rate: 88 (07/20 1500)  Labs: Recent Labs    02/17/20 0718 02/17/20 0946 02/17/20 1528  HGB 12.9  --   --   HCT 40.8  --   --   PLT 237  --   --   APTT 27  --   --   LABPROT 13.3  --   --   INR 1.1  --   --   HEPARINUNFRC  --   --  0.27*  CREATININE 0.83  --   --   TROPONINIHS 209* 291*  --     Estimated Creatinine Clearance: 63.4 mL/min (by C-G formula based on SCr of 0.83 mg/dL).   Medical History: Past Medical History:  Diagnosis Date  . Benign brain tumor [facility])     Assessment: 69 yr old female was admitted with chest pain, SOB, pallor and diaphoresis; diagnosed with NSTEMI. Pharmacy was consulted to dose heparin for ACS. Pt was also evaluated for possible PE (ruled out by chest CTA). Pt was on no anticoagulants PTA.   Initial heparin level ~7 hrs after heparin 4000 units IV bolus X 1, followed by heparin infusion at 850 units/hr, was 0.27 units/ml, which is below the goal range for this pt. CBC WNL. Per ED RN, no issues with IV or bleeding observed.  Cardiac cath planned for today, 02/17/20 (per ED RN, pt just went to cath lab at ~1615 PM)  Goal of Therapy:  Heparin level 0.3-0.7 units/ml Monitor platelets by anticoagulation protocol: Yes   Plan:  F/U after cath completed re: plans for heparin  If pt remains on heparin post cath, will increase infusion rate to 1000 units/hr and ck heparin level 6 hrs after restarting heparin infusion post cath Monitor daily heparin level, CBC Monitor for signs/symptoms  of bleeding F/U cath results  Gillermina Hu, PharmD, BCPS, [facility] Clinical Pharmacist 02/17/2020 4:01 PM

## 2020-02-17 NOTE — ED Provider Notes (Signed)
[facility] EMERGENCY DEPARTMENT Provider Note   CSN: [id number] Arrival date & time: 02/17/20  0707     History Chest pain  Becky Boyle is a 69 y.o. female.  HPI   Patient presents emergency room with complaints of shortness of breath chest and back pain.  Patient states she woke up suddenly this morning about 4 AM.  She did not feel like she could catch her breath.  She felt weak and diaphoretic.  Pain was initially in her back and then spread towards her shoulders on the front of her chest.  Patient called EMS.  They activated a STEMI but were unable to transmit the EKG so the patient came to the ED directly.  Patient denies having any trouble with fevers or chills.  She denies any cough.  No leg swelling.  No prior history of heart disease.  Patient denies any medical problems and does not take any medications regularly.  No past medical history on file.  There are no problems to display for this patient.   Past Surgical History:  Procedure Laterality Date  . ABDOMINAL HYSTERECTOMY    . BRAIN SURGERY     tumor      OB History    Gravida      Para      Term      Preterm      AB      Living  4     SAB      TAB      Ectopic      Multiple      Live Births              No family history on file.  Social History   Tobacco Use  . Smoking status: Never Smoker  . Smokeless tobacco: Never Used  Substance Use Topics  . Alcohol use: No  . Drug use: No    Home Medications Prior to Admission medications   Medication Sig Start Date End Date Taking? Authorizing Provider  Pseudoeph-Doxylamine-DM-APAP (DAYQUIL/NYQUIL COLD/FLU RELIEF PO) Take 2 capsules by mouth every 6 (six) hours as needed (cold / congestion symptoms).   Yes [provider]  clindamycin (CLEOCIN) 150 MG capsule Take 2 capsules (300 mg total) by mouth 3 (three) times daily. For 7 days Patient not taking: Reported on 02/17/2020 03/24/16   Triplett, Tammy, PA-C    traMADol (ULTRAM) 50 MG tablet Take 1 tablet (50 mg total) by mouth every 6 (six) hours as needed. Patient not taking: Reported on 02/17/2020 03/24/16   Kem Parkinson, PA-C    Allergies    Latex, Penicillins, Codeine, and Tape  Review of Systems   Review of Systems  All other systems reviewed and are negative.   Physical Exam Updated Vital Signs BP (!) 158/87   Pulse 100   Temp 98 F (36.7 C) (Oral)   Resp 18   Ht 1.626 m (5\' 4" )   Wt 72.6 kg   SpO2 100%   BMI 27.46 kg/m   Physical Exam Vitals and nursing note reviewed.  Constitutional:      Appearance: She is well-developed. She is not diaphoretic.  HENT:     Head: Normocephalic and atraumatic.     Right Ear: External ear normal.     Left Ear: External ear normal.  Eyes:     General: No scleral icterus.       Right eye: No discharge.        Left eye:  No discharge.     Conjunctiva/sclera: Conjunctivae normal.  Neck:     Trachea: No tracheal deviation.  Cardiovascular:     Rate and Rhythm: Regular rhythm. Tachycardia present.  Pulmonary:     Effort: Pulmonary effort is normal. No respiratory distress.     Breath sounds: Normal breath sounds. No stridor. No wheezing or rales.  Abdominal:     General: Bowel sounds are normal. There is no distension.     Palpations: Abdomen is soft.     Tenderness: There is no abdominal tenderness. There is no guarding or rebound.  Musculoskeletal:        General: No tenderness.     Cervical back: Neck supple.  Skin:    General: Skin is warm and dry.     Findings: No rash.  Neurological:     Mental Status: She is alert.     Cranial Nerves: No cranial nerve deficit (no facial droop, extraocular movements intact, no slurred speech).     Sensory: No sensory deficit.     Motor: No abnormal muscle tone or seizure activity.     Coordination: Coordination normal.     ED Results / Procedures / Treatments   Labs (all labs ordered are listed, but only abnormal results are  displayed) Labs Reviewed  HEMOGLOBIN A1C - Abnormal; Notable for the following components:      Result Value   Hgb A1c MFr Bld 9.5 (*)    All other components within normal limits  CBC WITH DIFFERENTIAL/PLATELET - Abnormal; Notable for the following components:   WBC 13.7 (*)    Neutro Abs 11.6 (*)    Abs Immature Granulocytes 0.12 (*)    All other components within normal limits  COMPREHENSIVE METABOLIC PANEL - Abnormal; Notable for the following components:   CO2 21 (*)    Glucose, Bld 251 (*)    Calcium 8.7 (*)    AST 52 (*)    ALT 54 (*)    All other components within normal limits  LIPID PANEL - Abnormal; Notable for the following components:   Triglycerides 181 (*)    HDL 34 (*)    All other components within normal limits  TROPONIN I (HIGH SENSITIVITY) - Abnormal; Notable for the following components:   Troponin I (High Sensitivity) 209 (*)    All other components within normal limits  TROPONIN I (HIGH SENSITIVITY) - Abnormal; Notable for the following components:   Troponin I (High Sensitivity) 291 (*)    All other components within normal limits  SARS CORONAVIRUS 2 BY RT PCR (HOSPITAL ORDER, [facility])  PROTIME-INR  APTT  HEPARIN LEVEL (UNFRACTIONATED)    EKG EKG Interpretation  Date/Time:  Tuesday February 17 2020 09:31:41 EDT Ventricular Rate:  98 PR Interval:    QRS Duration: 100 QT Interval:  374 QTC Calculation: 478 R Axis:   84 Text Interpretation: Sinus rhythm Anterior infarct, old Minimal ST depression, inferior leads No significant change since last tracing Confirmed by Dorie Rank [phone]) on 02/17/2020 9:39:46 AM   Radiology CT Angio Chest PE W and/or Wo Contrast  Result Date: 02/17/2020 CLINICAL DATA:  Shortness of breath, chest pain EXAM: CT ANGIOGRAPHY CHEST WITH CONTRAST TECHNIQUE: Multidetector CT imaging of the chest was performed using the standard protocol during bolus administration of intravenous contrast.  Multiplanar CT image reconstructions and MIPs were obtained to evaluate the vascular anatomy. CONTRAST:  101mL OMNIPAQUE IOHEXOL 350 MG/ML SOLN COMPARISON:  None. FINDINGS: Cardiovascular: Satisfactory opacification  of the pulmonary arteries to the segmental level. No evidence of pulmonary embolism. Normal heart size. No pericardial effusion. Mediastinum/Nodes: There are no enlarged lymph nodes identified. Lungs/Pleura: Small to moderate pleural effusions, right larger than left. Interlobular septal and peribronchovascular thickening. Patchy ground-glass density likely reflecting a combination of atelectasis due to expiratory imaging and edema. Bibasilar atelectasis. Few scattered small nodular opacities. Upper Abdomen: No acute abnormality. Musculoskeletal: Chronic mild compression deformity of T3 and T4. Review of the MIP images confirms the above findings. IMPRESSION: Findings likely reflect pulmonary edema with small to moderate pleural effusions and mild cardiomegaly. Few scattered small nodular opacities measuring up to 4 mm. No follow-up needed if patient is low-risk (and has no known or suspected primary neoplasm). Non-contrast chest CT can be considered in 12 months if patient is high-risk. This recommendation follows the consensus statement: Guidelines for Management of Incidental Pulmonary Nodules Detected on CT Images: From the Fleischner Society 2017; Radiology 2017; 284:228-243. Electronically Signed   By: Macy Mis M.D.   On: 02/17/2020 09:15   DG Chest Port 1 View  Result Date: 02/17/2020 CLINICAL DATA:  Shortness of breath EXAM: PORTABLE CHEST 1 VIEW COMPARISON:  None. FINDINGS: The cardiomediastinal silhouette is mildly enlarged. Likely trace bilateral pleural effusions. No pneumothorax. Biapical pleuroparenchymal scarring. Diffuse interstitial prominence with perihilar vascular prominence. Bibasilar heterogeneous opacities. Degenerative changes of the thoracic spine. Visualized abdomen is  unremarkable. IMPRESSION: 1. Constellation of findings are favored to reflect mild pulmonary edema with scattered atelectasis. Electronically Signed   By: Valentino Saxon MD   On: 02/17/2020 07:59    Procedures .Critical Care Performed by: Dorie Rank, MD Authorized by: Dorie Rank, MD   Critical care provider statement:    Critical care time (minutes):  45   Critical care was time spent personally by me on the following activities:  Discussions with consultants, evaluation of patient's response to treatment, examination of patient, ordering and performing treatments and interventions, ordering and review of laboratory studies, ordering and review of radiographic studies, pulse oximetry, re-evaluation of patient's condition, obtaining history from patient or surrogate and review of old charts   (including critical care time)  Medications Ordered in ED Medications  0.9 %  sodium chloride infusion (has no administration in time range)  aspirin chewable tablet 324 mg (324 mg Oral Not Given 02/17/20 0733)  heparin ADULT infusion 100 units/mL (25000 units/236mL sodium chloride 0.45%) (850 Units/hr Intravenous New Bag/Given 02/17/20 0825)  nitroGLYCERIN 50 mg in dextrose 5 % 250 mL (0.2 mg/mL) infusion (5 mcg/min Intravenous New Bag/Given 02/17/20 0954)  heparin injection 4,000 Units (4,000 Units Intravenous Given 02/17/20 0734)  iohexol (OMNIPAQUE) 350 MG/ML injection 100 mL (75 mLs Intravenous Contrast Given 02/17/20 0901)  acetaminophen (TYLENOL) tablet 650 mg (650 mg Oral Given 02/17/20 0912)  fentaNYL (SUBLIMAZE) injection 75 mcg (75 mcg Intravenous Given 02/17/20 0947)  metoprolol tartrate (LOPRESSOR) injection 5 mg (5 mg Intravenous Given 02/17/20 0950)    ED Course  I have reviewed the triage vital signs and the nursing notes.  Pertinent labs & imaging results that were available during my care of the patient were reviewed by me and considered in my medical decision making (see chart for  details).  Clinical Course as of Feb 17 1123  Tue Feb 17, 2020  0720 Patient's initial EKG did show some subtle ST elevation anteriorly but not definitive for ST elevation MI.  Repeat EKG showed improvement over those changes as the rate decreased.  Case and EKG were  reviewed/discussed with Dr. Irish Lack  Plan is to not proceed to cardiac catheterization lab.  We will evaluate for alternative causes such as PE.   [JK]  0841 Troponin elevated at 209.   [JK]  [id number] Chest x-ray suggests mild pulmonary edema   [JK]  0938 CT scan without PE.  Labs also confirm previously undiagnosed DM.  Pt stilll having pain.  Will start ntg drip.  Pain meds.  Cons cardiology for admission, NSTEMI   [JK]  22 Spoke with [place], cardiology.  Cardiology team will evaluate pt in ED   [JK]  1123 Feeling better on ntg drip   [JK]    Clinical Course User Index [JK] Dorie Rank, MD   MDM Rules/Calculators/A&P                          Patient presented to the ED for evaluation of chest pain.  Patient's EKG was concerning for the possibility of acute coronary syndrome.  Not clearly an ST elevation MI.  With the patient's acute shortness of breath pulmonary embolism was also concerned.  CT angiogram was performed and there is no evidence of PE.  Patient's troponin is elevated at 209.  This is concerning for non-ST elevation MI.  Serial EKGs do not show any significant changes.  Patient has been started on heparin and aspirin.  I have also ordered nitroglycerin.  Continue for pain.  Plan on admission to the hospital for further treatment.  I will consult the cardiology service.  Patient will likely need cardiac catheterization if her pain persists  LAbs also suggest DM, never previously diagnosed. Final Clinical Impression(s) / ED Diagnoses Final diagnoses:  NSTEMI (non-ST elevated myocardial infarction) ([place])  Type 2 diabetes mellitus with hyperglycemia, without long-term current use of insulin ([facility])      Dorie Rank,  MD 02/17/20 1124

## 2020-02-18 ENCOUNTER — Encounter (HOSPITAL_COMMUNITY): Payer: Self-pay | Admitting: Interventional Cardiology

## 2020-02-18 ENCOUNTER — Inpatient Hospital Stay (HOSPITAL_COMMUNITY): Payer: Medicare HMO

## 2020-02-18 DIAGNOSIS — I351 Nonrheumatic aortic (valve) insufficiency: Secondary | ICD-10-CM

## 2020-02-18 DIAGNOSIS — I340 Nonrheumatic mitral (valve) insufficiency: Secondary | ICD-10-CM

## 2020-02-18 LAB — BASIC METABOLIC PANEL
Anion gap: 13 (ref 5–15)
BUN: 11 mg/dL (ref 8–23)
CO2: 27 mmol/L (ref 22–32)
Calcium: 9.4 mg/dL (ref 8.9–10.3)
Chloride: 99 mmol/L (ref 98–111)
Creatinine, Ser: 0.83 mg/dL (ref 0.44–1.00)
GFR calc Af Amer: >60 mL/min (ref >60)
GFR calc non Af Amer: >60 mL/min (ref >60)
Glucose, Bld: 193 mg/dL — ABNORMAL HIGH (ref 70–99)
Potassium: 3.4 mmol/L — ABNORMAL LOW (ref 3.5–5.1)
Sodium: 139 mmol/L (ref 135–145)

## 2020-02-18 LAB — GLUCOSE, CAPILLARY
Glucose-Capillary: 177 mg/dL — ABNORMAL HIGH (ref 70–99)
Glucose-Capillary: 204 mg/dL — ABNORMAL HIGH (ref 70–99)
Glucose-Capillary: 278 mg/dL — ABNORMAL HIGH (ref 70–99)
Glucose-Capillary: 179 mg/dL — ABNORMAL HIGH (ref 70–99)

## 2020-02-18 LAB — CBC
HCT: 41 % (ref 36.0–46.0)
Hemoglobin: 13.3 g/dL (ref 12.0–15.0)
MCH: 28.8 pg (ref 26.0–34.0)
MCHC: 32.4 g/dL (ref 30.0–36.0)
MCV: 88.7 fL (ref 80.0–100.0)
Platelets: 273 10*3/uL (ref 150–400)
RBC: 4.62 MIL/uL (ref 3.87–5.11)
RDW: 13.2 % (ref 11.5–15.5)
WBC: 13.1 10*3/uL — ABNORMAL HIGH (ref 4.0–10.5)
nRBC: 0 % (ref 0.0–0.2)

## 2020-02-18 LAB — ECHOCARDIOGRAM COMPLETE
Area-P 1/2: 3.89 cm2
Height: 64 in
MV M vel: 5.28 m/s
MV Peak grad: 111.5 mmHg
P 1/2 time: 447 msec
Radius: 0.3 cm
S' Lateral: 4.2 cm
Weight: 2574.97 oz

## 2020-02-18 LAB — COOXEMETRY PANEL
Carboxyhemoglobin: 1.3 % (ref 0.5–1.5)
Methemoglobin: 0.8 % (ref 0.0–1.5)
O2 Saturation: 64.3 %
Total hemoglobin: 13.8 g/dL (ref 12.0–16.0)

## 2020-02-18 LAB — HEPARIN LEVEL (UNFRACTIONATED): Heparin Unfractionated: <0.1 IU/mL — ABNORMAL LOW (ref 0.30–0.70)

## 2020-02-18 MED ORDER — SPIRONOLACTONE 12.5 MG HALF TABLET
12.5000 mg | ORAL_TABLET | Freq: Every day | ORAL | Status: DC
  Administered 2020-02-18: 12.5 mg via ORAL
  Filled 2020-02-18 (×2): qty 1

## 2020-02-18 MED ORDER — SACUBITRIL-VALSARTAN 24-26 MG PO TABS
1.0000 | ORAL_TABLET | Freq: Two times a day (BID) | ORAL | Status: DC
  Administered 2020-02-18 – 2020-02-20 (×5): 1 via ORAL
  Filled 2020-02-18 (×6): qty 1

## 2020-02-18 MED ORDER — INSULIN ASPART 100 UNIT/ML ~~LOC~~ SOLN
0.0000 [IU] | Freq: Three times a day (TID) | SUBCUTANEOUS | Status: DC
  Administered 2020-02-18: 5 [IU] via SUBCUTANEOUS
  Administered 2020-02-18 – 2020-02-19 (×2): 8 [IU] via SUBCUTANEOUS
  Administered 2020-02-19: 3 [IU] via SUBCUTANEOUS
  Administered 2020-02-19: 5 [IU] via SUBCUTANEOUS
  Administered 2020-02-20: 3 [IU] via SUBCUTANEOUS

## 2020-02-18 MED ORDER — LIVING WELL WITH DIABETES BOOK
Freq: Once | Status: AC
  Filled 2020-02-18: qty 1

## 2020-02-18 MED ORDER — POTASSIUM CHLORIDE CRYS ER 20 MEQ PO TBCR
40.0000 meq | EXTENDED_RELEASE_TABLET | Freq: Once | ORAL | Status: AC
  Administered 2020-02-18: 40 meq via ORAL
  Filled 2020-02-18: qty 2

## 2020-02-18 MED ORDER — INSULIN ASPART 100 UNIT/ML ~~LOC~~ SOLN: 0.0000 [IU] | Freq: Every day | SUBCUTANEOUS | Status: DC

## 2020-02-18 NOTE — Progress Notes (Signed)
Inpatient Diabetes Program Recommendations  AACE/ADA: New Consensus Statement on Inpatient Glycemic Control (2015)  Target Ranges:  Prepandial:   less than 140 mg/dL      Peak postprandial:   less than 180 mg/dL (1-2 hours)      Critically ill patients:  140 - 180 mg/dL   Lab Results  Component Value Date   GLUCAP 204 (H) 02/18/2020   HGBA1C 9.5 (H) 02/17/2020    Review of Glycemic Control Results for Becky Boyle, Becky Boyle (MRN [id number]) as of 02/18/2020 13:33  Ref. Range 02/17/2020 21:57 02/18/2020 06:44 02/18/2020 11:15  Glucose-Capillary Latest Ref Range: 70 - 99 mg/dL 262 (H) 177 (H) 204 (H)   Diabetes history:  New onset DM2  Outpatient Diabetes medications:  None  Current orders for Inpatient glycemic control:  Novolog 0-9 units TID and 0-5 units QHS   Note: Spoke with pt about new diagnosis at bedside. Discussed A1C results with them and explained what an A1C is, basic pathophysiology of DM Type 2, basic home care, basic diabetes diet nutrition principles, importance of checking CBGs and maintaining good CBG control to prevent long-term and short-term complications. Reviewed signs and symptoms of hyperglycemia and hypoglycemia and how to treat hypoglycemia at home. Also reviewed blood sugar goals at home.  RNs to provide ongoing basic DM education at bedside with this patient. Have ordered educational booklet and education. Have also placed RD consult for DM diet education for this patient. Attached education to AVS.  Please allow patient to check CBG's.    Educated patient on CHO's,  foods that contain CHO's, goal CHO intake of 50 grams per meal and The Plate Method.  Discuss importance of exercise on lowering blood sugar.   Per Cardiology note, pt will likely be discharged on Metformin and SGLT2 inhibitor.  Educated pt on both.    She does not have a current PCP; consulted TOC for assistance.    Asked her to check her blood sugar at home at least one time a day preferably in the  morning before breakfast.  She will need a glucometer at DC.  Order # [id number].  Will continue to follow while inpatient.  Thank you, Reche Dixon, RN, BSN Diabetes Coordinator Inpatient Diabetes Program [phone] (team pager from 8a-5p)

## 2020-02-18 NOTE — TOC Initial Note (Addendum)
Transition of Care [facility]) - Initial/Assessment Note    Patient Details  Name: Becky Boyle MRN: [id number] Date of Birth: 02-25-1951  [facility] CM/SW Contact:    Curlene Labrum, RN Phone Number: 02/18/2020, 11:01 AM  Clinical Narrative:                 Case Management set message to [facility] cma to place benefits check for Lottie Mussel, and Brilinta for the patient.  I spoke with Gearldine Bienenstock and all needed clinicals to [phone] for [facility] prior to discharge and/or transfer out to telemetry today. [facility] follow up is scheduled for 03/15/2020 at 2:30 pm.  Delene Loll patient assistance activated through the [email or web address] website with patient's information required.  Entresto 30-Day Free Trial offer card was sent to the [facility] for when the patient discharges home.  Expected Discharge Plan: [place] Barriers to Discharge: Continued Medical Work up   Patient Goals and CMS Choice Patient states their goals for this hospitalization and ongoing recovery are:: to get better and go home CMS Medicare.gov Compare Post Acute Care list provided to:: Patient    Expected Discharge Plan and Services Expected Discharge Plan: [place]                             Date DME Agency Contacted: 02/18/20 Time DME Agency Contacted: [phone] (-faxed clinicals - [phone]) Representative spoke with at DME Agency: Dorian Pod with [facility]            Prior Living Arrangements/Services     Patient language and need for interpreter reviewed:: Yes Do you feel safe going back to the place where you live?: Yes      Need for Family Participation in Patient Care: Yes (Comment) Care giver support system in place?: Yes (comment)   Criminal Activity/Legal Involvement Pertinent to Current Situation/Hospitalization: No - Comment as needed  Activities of Daily Living      Permission Sought/Granted Permission  sought to share information with : Case Manager Permission granted to share information with : Yes, Verbal Permission Granted     Permission granted to share info w AGENCY: [facility]        Emotional Assessment       Orientation: : Oriented to Self, Oriented to Place, Oriented to  Time, Oriented to Situation   Psych Involvement: No (comment)  Admission diagnosis:  NSTEMI (non-ST elevated myocardial infarction) ([place]) [I21.4] Type 2 diabetes mellitus with hyperglycemia, without long-term current use of insulin ([place]) [E11.65] Patient Active Problem List   Diagnosis Date Noted  . NSTEMI (non-ST elevated myocardial infarction) ([place]) 02/17/2020  . Type 2 diabetes mellitus with hyperglycemia, without long-term current use of insulin ([place])   . 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

## 2020-02-18 NOTE — Progress Notes (Signed)
  Echocardiogram 2D Echocardiogram has been performed.  Becky Boyle 02/18/2020, 8:58 AM

## 2020-02-18 NOTE — Progress Notes (Signed)
CARDIAC REHAB PHASE I   PRE:  Rate/Rhythm: 89 SR  BP:  Sitting: 113/66      SaO2: 96 RA  MODE:  Ambulation: 160 ft   POST:  Rate/Rhythm: 99 SR  BP:  Sitting: 110/70    SaO2: 98 RA  Pt ambulated 1100f in hallway standby assist with slow steady gait. Pt took one short standing rest break c/o fatigue. Pt states improvement from walk earlier today though. MI education completed with pt and family. Pt educated on importance of ASA, Brilinta, statin, and NTG. Pt c/o some SOB, encouraged pt to take Brilinta with caffeine. Pt given MI book along with heart healthy and diabetic diets. Pt new diabetic, states she has met with diabetes coordinator. Reviewed restrictions, site care, and exercise guidelines. Encouraged continued ambulation. Will refer to CRP II [place]. Will f/u tomorrow to review echo results and answer further questions or concerns.   [id number] Falco RN BSN 02/18/2020 2:36 PM

## 2020-02-18 NOTE — Plan of Care (Signed)
  RD consulted for nutrition education regarding diabetes.   Lab Results  Component Value Date   HGBA1C 9.5 (H) 02/17/2020    RD provided "Carbohydrate Counting for People with Diabetes" handout from the Academy of Nutrition and Dietetics. Discussed different food groups and their effects on blood sugar, emphasizing carbohydrate-containing foods. Provided list of carbohydrates and recommended serving sizes of common foods.  Discussed importance of controlled and consistent carbohydrate intake throughout the day. Pt currently only eats 1 meal per day around 4pm daily. Pt to try and eat a least 3 times per day (when she first wakes up and then not going 4-5 hours without eating during the day).  Provided examples of ways to balance meals/snacks and encouraged intake of high-fiber, whole grain complex carbohydrates. Pt currently drinks Sweet Tea; plans to switch to Unsweet Tea with sugar substitute. Pt with appropriate questions and all answered. Teach back method used.  Expect good compliance. Pt would benefit from outpatient education for DM; newly dx DM. Pt agreeable to referral; RD placed referral to [facility]  Body mass index is 27.62 kg/m.   Current diet order is Heart Healthy/Cabr Modified, reports good appetite. Labs and medications reviewed. No further nutrition interventions warranted at this time. RD contact information provided. If additional nutrition issues arise, please re-consult RD.  Kerman Passey MS, RDN, LDN, CNSC Registered Dietitian III Clinical Nutrition RD Pager and On-Call Pager Number Located in [place]

## 2020-02-18 NOTE — Progress Notes (Addendum)
Patient ID: Becky Boyle, female   DOB: Sep 15, 1950, 69 y.o.   MRN: [id number]     Advanced Heart Failure Rounding Note  PCP-Cardiologist: No primary care provider on file.   Subjective:    Anxious with dyspnea overnight. Diuresed well with Lasix yesterday, CVP 3 this morning.  Breathing much better.  No chest pain.  Co-ox 64%.   Echo at bedside was reviewed, EF around 25-30% with akinetic septal wall.    Objective:   Weight Range: 73 kg Body mass index is 27.62 kg/m.   Vital Signs:   Temp:  [97.7 F (36.5 C)-98.3 F (36.8 C)] 97.7 F (36.5 C) (07/21 0734) Pulse Rate:  [0-118] 91 (07/21 0604) Resp:  [0-52] 17 (07/21 0604) BP: (109-190)/(50-129) 132/50 (07/21 0604) SpO2:  [0 %-100 %] 99 % (07/21 0604) Weight:  [73 kg-76.2 kg] 73 kg (07/21 0600) Last BM Date: 02/16/20  Weight change: Filed Weights   02/17/20 0714 02/17/20 1809 02/18/20 0600  Weight: 72.6 kg 76.2 kg 73 kg    Intake/Output:   Intake/Output Summary (Last 24 hours) at 02/18/2020 0812 Last data filed at 02/18/2020 0600 Gross per 24 hour  Intake 224.16 ml  Output 3050 ml  Net -2825.84 ml      Physical Exam    General:  Well appearing. No resp difficulty HEENT: Normal Neck: Supple. JVP not elevated. Carotids 2+ bilat; no bruits. No lymphadenopathy or thyromegaly appreciated. Cor: PMI nondisplaced. Regular rate & rhythm. No rubs, gallops or murmurs. Lungs: Clear Abdomen: Soft, nontender, nondistended. No hepatosplenomegaly. No bruits or masses. Good bowel sounds. Extremities: No cyanosis, clubbing, rash, edema Neuro: Alert & orientedx3, cranial nerves grossly intact. moves all 4 extremities w/o difficulty. Affect pleasant   Telemetry   NSR 70s (personally reviewed).   Labs    CBC Recent Labs    02/17/20 0718 02/17/20 1640 02/17/20 1646 02/18/20 0230  WBC 13.7*  --   --  13.1*  NEUTROABS 11.6*  --   --   --   HGB 12.9   < > 14.3 13.3  HCT 40.8   < > 42.0 41.0  MCV 92.5  --   --  88.7    PLT 237  --   --  273   < > = values in this interval not displayed.   Basic Metabolic Panel Recent Labs    02/17/20 0718 02/17/20 1640 02/17/20 1646 02/18/20 0230  NA 139   < > 144 139  K 3.8   < > 3.7 3.4*  CL 107  --   --  99  CO2 21*  --   --  27  GLUCOSE 251*  --   --  193*  BUN 13  --   --  11  CREATININE 0.83  --   --  0.83  CALCIUM 8.7*  --   --  9.4   < > = values in this interval not displayed.   Liver Function Tests Recent Labs    02/17/20 0718  AST 52*  ALT 54*  ALKPHOS 84  BILITOT 1.1  PROT 7.2  ALBUMIN 3.5   No results for input(s): LIPASE, AMYLASE in the last 72 hours. Cardiac Enzymes No results for input(s): CKTOTAL, CKMB, CKMBINDEX, TROPONINI in the last 72 hours.  BNP: BNP (last 3 results) Recent Labs    02/17/20 1528  BNP 1,502.6*    ProBNP (last 3 results) No results for input(s): PROBNP in the last 8760 hours.   D-Dimer No results for  input(s): DDIMER in the last 72 hours. Hemoglobin A1C Recent Labs    02/17/20 0718  HGBA1C 9.5*   Fasting Lipid Panel Recent Labs    02/17/20 0718  CHOL 165  HDL 34*  LDLCALC 95  TRIG 181*  CHOLHDL 4.9   Thyroid Function Tests Recent Labs    02/17/20 1528  TSH 3.981    Other results:   Imaging    CT Angio Chest PE W and/or Wo Contrast  Result Date: 02/17/2020 CLINICAL DATA:  Shortness of breath, chest pain EXAM: CT ANGIOGRAPHY CHEST WITH CONTRAST TECHNIQUE: Multidetector CT imaging of the chest was performed using the standard protocol during bolus administration of intravenous contrast. Multiplanar CT image reconstructions and MIPs were obtained to evaluate the vascular anatomy. CONTRAST:  72mL OMNIPAQUE IOHEXOL 350 MG/ML SOLN COMPARISON:  None. FINDINGS: Cardiovascular: Satisfactory opacification of the pulmonary arteries to the segmental level. No evidence of pulmonary embolism. Normal heart size. No pericardial effusion. Mediastinum/Nodes: There are no enlarged lymph nodes  identified. Lungs/Pleura: Small to moderate pleural effusions, right larger than left. Interlobular septal and peribronchovascular thickening. Patchy ground-glass density likely reflecting a combination of atelectasis due to expiratory imaging and edema. Bibasilar atelectasis. Few scattered small nodular opacities. Upper Abdomen: No acute abnormality. Musculoskeletal: Chronic mild compression deformity of T3 and T4. Review of the MIP images confirms the above findings. IMPRESSION: Findings likely reflect pulmonary edema with small to moderate pleural effusions and mild cardiomegaly. Few scattered small nodular opacities measuring up to 4 mm. No follow-up needed if patient is low-risk (and has no known or suspected primary neoplasm). Non-contrast chest CT can be considered in 12 months if patient is high-risk. This recommendation follows the consensus statement: Guidelines for Management of Incidental Pulmonary Nodules Detected on CT Images: From the Fleischner Society 2017; Radiology 2017; 284:228-243. Electronically Signed   By: Macy Mis M.D.   On: 02/17/2020 09:15   CARDIAC CATHETERIZATION  Result Date: 02/17/2020  Prox RCA lesion is 100% stenosed. This is a chronic total occlusion with right to right and left to right collaterals.  2nd Mrg lesion is 70% stenosed.  Mid LAD lesion is 80% stenosed.  A drug-eluting stent was successfully placed using a STENT RESOLUTE ONYX 2.75X38, postdilated to 3.25 mm.  Post intervention, there is a 0% residual stenosis.  Ost LAD to Mid LAD lesion is 99% stenosed.  A drug-eluting stent was successfully placed using a STENT RESOLUTE ONYX 3.0X26, postdilated to 3.6 mm.  Post intervention, there is a 0% residual stenosis.  There is moderate to severe left ventricular systolic dysfunction.  The left ventricular ejection fraction is 25-35% by visual estimate.  LV end diastolic pressure is moderately elevated.  There is no aortic valve stenosis.  Hemodynamic  findings consistent with mild pulmonary hypertension.  Continue diuresis for her volume overload in the setting of acute systolic heart failure. Dual antiplatelet therapy for a year.  Aggressive medical therapy for her heart failure.  Watch in ICU.  Discussed with heart failure team.  We will give additional Lasix later today.  Plan for PICC line to monitor co-oximetry.   [place] EKG SITE RITE  Result Date: 02/17/2020 If Site Rite image not attached, placement could not be confirmed due to current cardiac rhythm.     Medications:     Scheduled Medications: . aspirin  81 mg Oral Daily  . atorvastatin  80 mg Oral QHS  . carvedilol  3.125 mg Oral BID WC  . Chlorhexidine Gluconate Cloth  6 each  Topical Daily  . heparin injection (subcutaneous)  5,000 Units Subcutaneous Q8H  . insulin aspart  0-5 Units Subcutaneous QHS  . insulin aspart  0-9 Units Subcutaneous TID WC  . mouth rinse  15 mL Mouth Rinse BID  . sacubitril-valsartan  1 tablet Oral BID  . sodium chloride flush  10-40 mL Intracatheter Q12H  . sodium chloride flush  3 mL Intravenous Q12H  . sodium chloride flush  3 mL Intravenous Q12H  . spironolactone  12.5 mg Oral Daily  . ticagrelor  90 mg Oral BID     Infusions: . sodium chloride 10 mL/hr at 02/17/20 1132  . sodium chloride 10 mL/hr at 02/18/20 0600  . sodium chloride       PRN Medications:  sodium chloride, acetaminophen, ALPRAZolam, nitroGLYCERIN, ondansetron (ZOFRAN) IV, sodium chloride flush, sodium chloride flush   Assessment/Plan   1. CAD: NSTEMI, HS-TnI currently not markedly high.  I suspect there was a plaque rupture event involving the LAD perhaps a couple of days prior to admission and she developed CHF since that time.  Now s/p overlapping DES to LAD.  There is a chronically occluded RCA with collaterals and moderate stenosis in PLOM which is unlikely to be hemodynamically significant.  - Continue ASA, ticagrelor.  - Statin.  2. Acute systolic CHF:  Ischemic cardiomyopathy.  EF 25-30% range on LV-gram and on echo done this morning and reviewed.  Severe dyspnea at admission.  Elevated filling pressures on RHC but relatively preserved cardiac output.  BP elevated. Good diuresis with IV Lasix, CVP down to 3 and co-ox 64% this morning.  She looks much better this morning.  - Does not need Lasix right now.  - Can stop NTG gtt.  - Add Entresto 24/26 bid.  - Add spironolactone 12.5 daily.  - Continue Coreg 3.125 mg bid.  - Will need Lifevest at discharge.  3. Diabetes: New diagnosis.  Will need SSI for now.  Eventual metformin, SGLT2 inhibitor.   Out of bed with cardiac rehab.    Length of Stay: 1  Loralie Champagne, MD  02/18/2020, 8:12 AM  Advanced Heart Failure Team Pager [phone] (M-F; 7a - 4p)  Please contact [facility] for night-coverage after hours (4p -7a ) and weekends on [email or web address]

## 2020-02-18 NOTE — Progress Notes (Signed)
Talked to patient about weighing herself every morning using the same scale. Also talked to patient about importance of taking her medications. Patient accepting of information however she expressed she is overwhelmed. RN provided encouragement and informed her we were here to support and help her. Patient very appreciative. Will continue to monitor and educate.

## 2020-02-18 NOTE — TOC Benefit Eligibility Note (Signed)
Transition of Care [facility]) Benefit Eligibility Note    Patient Details  Name: Becky Boyle MRN: [id number] Date of Birth: 08/11/1950   Medication/Dose: Delene Loll  24/26 MG BID CO-PAY- $ 47.00  ,  JANUVIA 10 MG DAILY CO-PAY- $47.00  Covered?: Yes  Tier: 3 Drug  Prescription Coverage Preferred Pharmacy: [facility]  Spoke with Person/Company/Phone Number:: ASHLEY  @  [employer] # [phone] OPT- [employer]BER  Co-Pay: $ 47.00  Prior Approval: No  Deductible: Unmet  Additional Notes: BRILINTA  90 MG BID COVER- YES CO-PAY- $250.00  TIER - 4 DRUG  P/A-NO    Memory Argue Phone Number: 02/18/2020, 1:18 PM

## 2020-02-19 LAB — GLUCOSE, CAPILLARY
Glucose-Capillary: 203 mg/dL — ABNORMAL HIGH (ref 70–99)
Glucose-Capillary: 196 mg/dL — ABNORMAL HIGH (ref 70–99)
Glucose-Capillary: 272 mg/dL — ABNORMAL HIGH (ref 70–99)
Glucose-Capillary: 175 mg/dL — ABNORMAL HIGH (ref 70–99)

## 2020-02-19 LAB — BASIC METABOLIC PANEL
Anion gap: 9 (ref 5–15)
BUN: 16 mg/dL (ref 8–23)
CO2: 25 mmol/L (ref 22–32)
Calcium: 8.9 mg/dL (ref 8.9–10.3)
Chloride: 102 mmol/L (ref 98–111)
Creatinine, Ser: 0.9 mg/dL (ref 0.44–1.00)
GFR calc Af Amer: >60 mL/min (ref >60)
GFR calc non Af Amer: >60 mL/min (ref >60)
Glucose, Bld: 181 mg/dL — ABNORMAL HIGH (ref 70–99)
Potassium: 3.8 mmol/L (ref 3.5–5.1)
Sodium: 136 mmol/L (ref 135–145)

## 2020-02-19 LAB — COOXEMETRY PANEL
Carboxyhemoglobin: 1.6 % — ABNORMAL HIGH (ref 0.5–1.5)
Methemoglobin: 0.7 % (ref 0.0–1.5)
O2 Saturation: 67.5 %
Total hemoglobin: 13.1 g/dL (ref 12.0–16.0)

## 2020-02-19 LAB — CBC
HCT: 39.6 % (ref 36.0–46.0)
Hemoglobin: 13 g/dL (ref 12.0–15.0)
MCH: 29.8 pg (ref 26.0–34.0)
MCHC: 32.8 g/dL (ref 30.0–36.0)
MCV: 90.8 fL (ref 80.0–100.0)
Platelets: 245 10*3/uL (ref 150–400)
RBC: 4.36 MIL/uL (ref 3.87–5.11)
RDW: 13.3 % (ref 11.5–15.5)
WBC: 9.4 10*3/uL (ref 4.0–10.5)
nRBC: 0 % (ref 0.0–0.2)

## 2020-02-19 MED ORDER — CARVEDILOL 6.25 MG PO TABS
6.2500 mg | ORAL_TABLET | Freq: Two times a day (BID) | ORAL | Status: DC
  Administered 2020-02-19: 6.25 mg via ORAL
  Administered 2020-02-19: 3.125 mg via ORAL
  Administered 2020-02-20: 6.25 mg via ORAL
  Filled 2020-02-19 (×2): qty 1

## 2020-02-19 MED ORDER — SPIRONOLACTONE 25 MG PO TABS
25.0000 mg | ORAL_TABLET | Freq: Every day | ORAL | Status: DC
  Administered 2020-02-19 – 2020-02-20 (×2): 25 mg via ORAL
  Filled 2020-02-19 (×2): qty 1

## 2020-02-19 MED ORDER — TRAMADOL HCL 50 MG PO TABS
50.0000 mg | ORAL_TABLET | Freq: Once | ORAL | Status: AC
  Administered 2020-02-19: 50 mg via ORAL
  Filled 2020-02-19: qty 1

## 2020-02-19 NOTE — Progress Notes (Signed)
CARDIAC REHAB PHASE I   PRE:  Rate/Rhythm: 81 SR  BP:  Sitting: 121/76      SaO2: 96 RA  MODE:  Ambulation: 370 ft   POST:  Rate/Rhythm: 98 SR  BP:  Sitting: 126/75    SaO2: 99 RA  Pt ambulated 373ft in hallway handheld assist with steady gait. Pt with some SOB upon return to room, but able to maintain sats. Pt coached through purse-lipped breathing. Pt given HF booklet and reviewed importance of daily weights and symptom monitoring. Pt states she does not have a scale at home but can get one. Pt denies further questions or concerns at this time. Encouraged continued ambulation. Will continue to follow.  [id number] Rufina Falco, RN BSN 02/19/2020 11:31 AM

## 2020-02-19 NOTE — Progress Notes (Signed)
Patient ID: Becky Boyle, female   DOB: [birth year], 69 y.o.   MRN: [id number]     Advanced Heart Failure Rounding Note  PCP-Cardiologist: No primary care provider on file.   Subjective:    Anxious overnight, mild chest pain.  Currently, no dyspnea or chest pain.  CVP 7 with co-ox 68%. .   Echo: EF 25-30% with septal/inferior/apical WMAs, normal RV.    Objective:   Weight Range: 74.9 kg Body mass index is 28.34 kg/m.   Vital Signs:   Temp:  [97.8 F (36.6 C)-98.6 F (37 C)] 98.5 F (36.9 C) (07/22 0700) Pulse Rate:  [76-102] 102 (07/22 0745) Resp:  [13-30] 17 (07/22 0745) BP: (108-154)/(48-90) 154/84 (07/22 0700) SpO2:  [89 %-99 %] 96 % (07/22 0745) Weight:  [74.9 kg] 74.9 kg (07/22 0500) Last BM Date: 02/16/20  Weight change: Filed Weights   02/17/20 1809 02/18/20 0600 02/19/20 0500  Weight: 76.2 kg 73 kg 74.9 kg    Intake/Output:   Intake/Output Summary (Last 24 hours) at 02/19/2020 0825 Last data filed at 02/19/2020 0600 Gross per 24 hour  Intake 305.19 ml  Output 1475 ml  Net -1169.81 ml      Physical Exam    General: NAD Neck: No JVD, no thyromegaly or thyroid nodule.  Lungs: Clear to auscultation bilaterally with normal respiratory effort. CV: Nondisplaced PMI.  Heart regular S1/S2, no S3/S4, no murmur.  No peripheral edema.  No carotid bruit.  Normal pedal pulses.  Abdomen: Soft, nontender, no hepatosplenomegaly, no distention.  Skin: Intact without lesions or rashes.  Neurologic: Alert and oriented x 3.  Psych: Normal affect. Extremities: No clubbing or cyanosis.  HEENT: Normal.   Telemetry   NSR 90s (personally reviewed).   Labs    CBC Recent Labs    02/17/20 0718 02/17/20 1640 02/18/20 0230 02/19/20 0538  WBC 13.7*  --  13.1* 9.4  NEUTROABS 11.6*  --   --   --   HGB 12.9   < > 13.3 13.0  HCT 40.8   < > 41.0 39.6  MCV 92.5  --  88.7 90.8  PLT 237  --  273 245   < > = values in this interval not displayed.   Basic Metabolic  Panel Recent Labs    02/18/20 0230 02/19/20 0538  NA 139 136  K 3.4* 3.8  CL 99 102  CO2 27 25  GLUCOSE 193* 181*  BUN 11 16  CREATININE 0.83 0.90  CALCIUM 9.4 8.9   Liver Function Tests Recent Labs    02/17/20 0718  AST 52*  ALT 54*  ALKPHOS 84  BILITOT 1.1  PROT 7.2  ALBUMIN 3.5   No results for input(s): LIPASE, AMYLASE in the last 72 hours. Cardiac Enzymes No results for input(s): CKTOTAL, CKMB, CKMBINDEX, TROPONINI in the last 72 hours.  BNP: BNP (last 3 results) Recent Labs    02/17/20 1528  BNP 1,502.6*    ProBNP (last 3 results) No results for input(s): PROBNP in the last 8760 hours.   D-Dimer No results for input(s): DDIMER in the last 72 hours. Hemoglobin A1C Recent Labs    02/17/20 0718  HGBA1C 9.5*   Fasting Lipid Panel Recent Labs    02/17/20 0718  CHOL 165  HDL 34*  LDLCALC 95  TRIG 181*  CHOLHDL 4.9   Thyroid Function Tests Recent Labs    02/17/20 1528  TSH 3.981    Other results:   Imaging    ECHOCARDIOGRAM COMPLETE  Result Date: 02/18/2020    ECHOCARDIOGRAM REPORT   Patient Name:   Becky Boyle [facility] Date of Exam: 02/18/2020 Medical Rec #:  [id number]     Height:       64.0 in Accession #:    [id number]    Weight:       160.9 lb Date of Birth:  [birth year]     BSA:          1.784 m Patient Age:    34 years      BP:           132/50 mmHg Patient Gender: F             HR:           92 bpm. Exam Location:  Inpatient Procedure: 2D Echo Indications:    chest pain 786.50  History:        Patient has no prior history of Echocardiogram examinations.                 CAD, Signs/Symptoms:Shortness of Breath and Chest Pain; Risk                 Factors:Diabetes.  Sonographer:    Johny Chess Referring Phys: [place]  1. Left ventricular ejection fraction, by estimation, is 25 to 30%. The left ventricle has severely decreased function. The left ventricle demonstrates regional wall motion abnormalities with akinesis  of the anteroseptal and inferoseptal walls, severe hypokinesis of the inferior wall. Akinesis of the apex. Left ventricular diastolic parameters are consistent with Grade I diastolic dysfunction (impaired relaxation).  2. Right ventricular systolic function is normal. The right ventricular size is normal. There is normal pulmonary artery systolic pressure. The estimated right ventricular systolic pressure is 62.9 mmHg.  3. The mitral valve is normal in structure. Mild mitral valve regurgitation.  4. The aortic valve is tricuspid. Aortic valve regurgitation is mild. Mild aortic valve sclerosis is present, with no evidence of aortic valve stenosis.  5. The inferior vena cava is normal in size with greater than 50% respiratory variability, suggesting right atrial pressure of 3 mmHg. FINDINGS  Left Ventricle: Left ventricular ejection fraction, by estimation, is 25 to 30%. The left ventricle has severely decreased function. The left ventricle demonstrates regional wall motion abnormalities. The left ventricular internal cavity size was normal  in size. There is no left ventricular hypertrophy. Left ventricular diastolic parameters are consistent with Grade I diastolic dysfunction (impaired relaxation). Right Ventricle: The right ventricular size is normal. No increase in right ventricular wall thickness. Right ventricular systolic function is normal. There is normal pulmonary artery systolic pressure. The tricuspid regurgitant velocity is 2.53 m/s, and  with an assumed right atrial pressure of 3 mmHg, the estimated right ventricular systolic pressure is 47.6 mmHg. Left Atrium: Left atrial size was normal in size. Right Atrium: Right atrial size was normal in size. Pericardium: Trivial pericardial effusion is present. Mitral Valve: The mitral valve is normal in structure. Mild mitral valve regurgitation. Tricuspid Valve: The tricuspid valve is normal in structure. Tricuspid valve regurgitation is trivial. Aortic Valve:  The aortic valve is tricuspid. Aortic valve regurgitation is mild. Aortic regurgitation PHT measures 447 msec. Mild aortic valve sclerosis is present, with no evidence of aortic valve stenosis. Pulmonic Valve: The pulmonic valve was normal in structure. Pulmonic valve regurgitation is not visualized. Aorta: The aortic root is normal in size and structure. Venous: The inferior vena cava is normal in size with greater  than 50% respiratory variability, suggesting right atrial pressure of 3 mmHg. IAS/Shunts: No atrial level shunt detected by color flow Doppler.  LEFT VENTRICLE PLAX 2D LVIDd:         5.20 cm  Diastology LVIDs:         4.20 cm  LV e' lateral:   7.40 cm/s LV PW:         0.90 cm  LV E/e' lateral: 11.9 LV IVS:        0.80 cm  LV e' medial:    4.57 cm/s LVOT diam:     1.60 cm  LV E/e' medial:  19.2 LV SV:         34 LV SV Index:   19 LVOT Area:     2.01 cm  RIGHT VENTRICLE             IVC RV S prime:     11.70 cm/s  IVC diam: 1.60 cm TAPSE (M-mode): 1.6 cm LEFT ATRIUM             Index       RIGHT ATRIUM          Index LA diam:        3.20 cm 1.79 cm/m  RA Area:     9.93 cm LA Vol (A2C):   38.5 ml 21.58 ml/m RA Volume:   21.60 ml 12.11 ml/m LA Vol (A4C):   40.3 ml 22.59 ml/m LA Biplane Vol: 41.3 ml 23.15 ml/m  AORTIC VALVE LVOT Vmax:   100.00 cm/s LVOT Vmean:  70.600 cm/s LVOT VTI:    0.170 m AI PHT:      447 msec  AORTA Ao Root diam: 2.60 cm Ao Asc diam:  2.50 cm MITRAL VALVE                 TRICUSPID VALVE MV Area (PHT): 3.89 cm      TR Peak grad:   25.6 mmHg MV Decel Time: 195 msec      TR Vmax:        253.00 cm/s MR Peak grad:    111.5 mmHg MR Mean grad:    62.0 mmHg   SHUNTS MR Vmax:         528.00 cm/s Systemic VTI:  0.17 m MR Vmean:        366.0 cm/s  Systemic Diam: 1.60 cm MR PISA:         0.57 cm MR PISA Eff ROA: 4 mm MR PISA Radius:  0.30 cm MV E velocity: 87.90 cm/s MV A velocity: 121.00 cm/s MV E/A ratio:  0.73 Loralie Champagne MD Electronically signed by Loralie Champagne MD Signature  Date/Time: 02/18/2020/3:36:54 PM    Final      Medications:     Scheduled Medications: . aspirin  81 mg Oral Daily  . atorvastatin  80 mg Oral QHS  . carvedilol  6.25 mg Oral BID WC  . Chlorhexidine Gluconate Cloth  6 each Topical Daily  . heparin injection (subcutaneous)  5,000 Units Subcutaneous Q8H  . insulin aspart  0-15 Units Subcutaneous TID WC  . insulin aspart  0-5 Units Subcutaneous QHS  . insulin aspart  0-5 Units Subcutaneous QHS  . mouth rinse  15 mL Mouth Rinse BID  . sacubitril-valsartan  1 tablet Oral BID  . sodium chloride flush  10-40 mL Intracatheter Q12H  . sodium chloride flush  3 mL Intravenous Q12H  . sodium chloride flush  3 mL Intravenous Q12H  .  spironolactone  25 mg Oral Daily  . ticagrelor  90 mg Oral BID    Infusions: . sodium chloride 10 mL/hr at 02/17/20 1132  . sodium chloride Stopped (02/18/20 1146)  . sodium chloride      PRN Medications: sodium chloride, acetaminophen, ALPRAZolam, nitroGLYCERIN, ondansetron (ZOFRAN) IV, sodium chloride flush, sodium chloride flush   Assessment/Plan   1. CAD: NSTEMI, HS-TnI currently not markedly high.  I suspect there was a plaque rupture event involving the LAD perhaps a couple of days prior to admission and she developed CHF since that time.  Now s/p overlapping DES to LAD.  There is a chronically occluded RCA with collaterals and moderate stenosis in PLOM which is unlikely to be hemodynamically significant.  - Continue ASA, ticagrelor.  - Statin.  2. Acute systolic CHF: Ischemic cardiomyopathy.  EF 25-30% range on LV-gram and on echo.  Severe dyspnea at admission.  Elevated filling pressures on RHC but relatively preserved cardiac output.  She now looks euvolemic with CVP 7 and co-ox 68%.  - Does not need Lasix right now.  - Continue Entresto 24/26 bid.  - Increase spironolactone to 25 mg daily.  - Increase Coreg to 6.25 mg bid.  - Will need Lifevest at discharge.  3. Diabetes: New diagnosis.  Will  need SSI for now.  Eventual metformin (tomorrow), SGLT2 inhibitor.   Can go to telemetry, home tomorrow.   Length of Stay: 2  Loralie Champagne, MD  02/19/2020, 8:25 AM  Advanced Heart Failure Team Pager [phone] (M-F; 7a - 4p)  Please contact [facility] for night-coverage after hours (4p -7a ) and weekends on [email or web address]

## 2020-02-20 LAB — CBC
HCT: 37.6 % (ref 36.0–46.0)
Hemoglobin: 12.1 g/dL (ref 12.0–15.0)
MCH: 29.4 pg (ref 26.0–34.0)
MCHC: 32.2 g/dL (ref 30.0–36.0)
MCV: 91.5 fL (ref 80.0–100.0)
Platelets: 229 10*3/uL (ref 150–400)
RBC: 4.11 MIL/uL (ref 3.87–5.11)
RDW: 13.2 % (ref 11.5–15.5)
WBC: 8.5 10*3/uL (ref 4.0–10.5)
nRBC: 0 % (ref 0.0–0.2)

## 2020-02-20 LAB — BASIC METABOLIC PANEL
Anion gap: 9 (ref 5–15)
BUN: 17 mg/dL (ref 8–23)
CO2: 26 mmol/L (ref 22–32)
Calcium: 9 mg/dL (ref 8.9–10.3)
Chloride: 103 mmol/L (ref 98–111)
Creatinine, Ser: 0.87 mg/dL (ref 0.44–1.00)
GFR calc Af Amer: >60 mL/min (ref >60)
GFR calc non Af Amer: >60 mL/min (ref >60)
Glucose, Bld: 193 mg/dL — ABNORMAL HIGH (ref 70–99)
Potassium: 4.1 mmol/L (ref 3.5–5.1)
Sodium: 138 mmol/L (ref 135–145)

## 2020-02-20 LAB — GLUCOSE, CAPILLARY
Glucose-Capillary: 189 mg/dL — ABNORMAL HIGH (ref 70–99)
Glucose-Capillary: 188 mg/dL — ABNORMAL HIGH (ref 70–99)

## 2020-02-20 LAB — COOXEMETRY PANEL
Carboxyhemoglobin: 1.3 % (ref 0.5–1.5)
Methemoglobin: 0.7 % (ref 0.0–1.5)
O2 Saturation: 55.2 %
Total hemoglobin: 12.9 g/dL (ref 12.0–16.0)

## 2020-02-20 MED ORDER — METFORMIN HCL 500 MG PO TABS: 500.0000 mg | ORAL_TABLET | Freq: Two times a day (BID) | ORAL | Status: DC

## 2020-02-20 MED ORDER — CARVEDILOL 6.25 MG PO TABS: 6.2500 mg | ORAL_TABLET | Freq: Two times a day (BID) | ORAL | 5 refills | Status: DC

## 2020-02-20 MED ORDER — TICAGRELOR 90 MG PO TABS: 90.0000 mg | ORAL_TABLET | Freq: Two times a day (BID) | ORAL | 11 refills | Status: DC

## 2020-02-20 MED ORDER — ASPIRIN EC 81 MG PO TBEC
81.0000 mg | DELAYED_RELEASE_TABLET | Freq: Every day | ORAL | 2 refills | Status: AC
Start: 2020-02-20 — End: 2021-02-19

## 2020-02-20 MED ORDER — METFORMIN HCL 500 MG PO TABS: 500.0000 mg | ORAL_TABLET | Freq: Two times a day (BID) | ORAL | 5 refills | Status: DC

## 2020-02-20 MED ORDER — NITROGLYCERIN 0.4 MG SL SUBL: 0.4000 mg | SUBLINGUAL_TABLET | SUBLINGUAL | 2 refills | Status: DC | PRN

## 2020-02-20 MED ORDER — ATORVASTATIN CALCIUM 80 MG PO TABS: 80.0000 mg | ORAL_TABLET | Freq: Every day | ORAL | 5 refills | Status: DC

## 2020-02-20 MED ORDER — SPIRONOLACTONE 25 MG PO TABS: 25.0000 mg | ORAL_TABLET | Freq: Every day | ORAL | 5 refills | Status: DC

## 2020-02-20 MED ORDER — SACUBITRIL-VALSARTAN 24-26 MG PO TABS: 1.0000 | ORAL_TABLET | Freq: Two times a day (BID) | ORAL | 5 refills | Status: DC

## 2020-02-20 MED FILL — metFORMIN HCL 500 MG TABS: 500 | 30 days supply | Qty: 60 | Fill #0

## 2020-02-20 MED FILL — ENTRESTO 24 MG-26 MG TABLET: 24-26 | 30 days supply | Qty: 60 | Fill #0

## 2020-02-20 MED FILL — BRILINTA 90 MG TABLET: 90 | 30 days supply | Qty: 60 | Fill #0

## 2020-02-20 MED FILL — CARVEDILOL 6.25 MG TABLET: 6.25 | 30 days supply | Qty: 60 | Fill #0

## 2020-02-20 MED FILL — SPIRONOLACTONE 25 MG TABLET: 25 | 30 days supply | Qty: 30 | Fill #0

## 2020-02-20 MED FILL — ATORVASTATIN CALCIUM 80 MG: 80 | 30 days supply | Qty: 30 | Fill #0

## 2020-02-20 MED FILL — NITROGLYCERIN 0.4 MG TAB SL: 0.4 | 7 days supply | Qty: 25 | Fill #0

## 2020-02-20 NOTE — Discharge Summary (Signed)
Advanced Heart Failure Team  Discharge Summary   Patient ID: Becky Boyle MRN: [id number], DOB/AGE: 1951-04-10 69 y.o. Admit date: 02/17/2020 D/C date:     02/20/2020   Primary Discharge Diagnoses:  NSTEMI CAD s/p PCI Acute Systolic Heart Failure/ Ischemic Cardiomyopathy    Hospital Course:   69 y.o. with history of remote brain tumor with resection (1998) was admitted today with chest pain and dyspnea.  She has not seen a doctor for years and takes no medications.  No ETOH or smoking.  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  She has sons with HTN and diabetes.  For several days, she noted mild chest tightness with moderate exertion.  Night prior to arrival, around 4 am, she woke up short of breath and had to sit on the side of the bed.  She had pain radiating from mid-back to chest.  She was weak and diaphoretic.  She came to the ER, initial ECG was concerning for slight anterior ST elevation, but repeat ECGs showed nonspecific anterior ST changes and possible old anterior MI.  CTA chest was done to rule out dissection, showing no PE or dissection, mild-moderate pleural effusions, and pulmonary edema.  HS-TnI 209 => 291 => 343, BNP 1503.  Main complaint in ER was dyspnea.  She got a bolus of IV Lasix.  Decision was made to take to cath lab.   RHC/LHC: mean RA 9, PA 43/16 mean 30, mean PCWP 20, CI 2.3.  The RCA was occluded proximally with collaterals.  There was 60% stenosis in a large PLOM.  The LAD had severe, extensive proximal disease up to 95%.  EF appears in the 25-30% range by hand injection.  W/ preserved cardiac index no mechanical support was needed. Percutaneous intervention versus CABG was discussed w/ interventionalist.  We elected PCI as it was suspect there has been an event involving the LAD within the last few days, the RCA is well-collateralized, and the PLOM disease is not likely to be hemodynamically significant.  Patient had overlapping DES to LAD  with good result.    She had no significant post cath complications. CP resolved. She was placed on DAPT w/ ASA + Brilinta + high potency statin and started on GDMT for systolic HF. LifeVest was placed prior to d/c. Hgb A1c was 9.5. She was started on Metformin. LDL 95 (goal <70).   On 7/23, she was seen and examined by Dr. Aundra Dubin and felt stable for d/c home. Post hospital f/u has been arranged.   See Detailed Problem List Below.  1. CAD: NSTEMI, HS-TnI currently not markedly high. I suspect there was a plaque rupture event involving the LAD perhaps a couple of days prior to admission and she developed CHF since that time. Now s/p overlapping DES to LAD. There is a chronically occluded RCA with collaterals and moderate stenosis in PLOM which is unlikely to be hemodynamically significant.  - Continue ASA, ticagrelor.  - Statin.  2. Acute systolic CHF: Ischemic cardiomyopathy. EF 25-30% range on LV-gram and on echo. Severe dyspnea at admission. Elevated filling pressures on RHC but relatively preserved cardiac output.  She now looks euvolemic with CVP 7 and co-ox 68%.  - Does not need Lasix.  - Continue Entresto 24/26 bid.  - Continue spironolactone 25 mg daily.  - Continue Coreg 6.25 mg bid.  - Will need Lifevest at discharge.  3. Diabetes: New diagnosis. Will need SSI for now. Home on metformin, eventual SGLT2 inhibitor.  4. Disposition: Home today.  Will need Lifevest.  Followup CHF clinic 10-14 days.  Followup PCP for new diabetes diagnosis.  Meds for home: ASA 81, ticagrelor 90 bid, atorvastatin 80 daily, Coreg 6.25 mg bid, Entresto 24/26 bid, spironolactone 25 daily, metformin 500 bid.     Discharge Weight Range: 164 lb  Discharge Vitals: Blood pressure 121/69, pulse 82, temperature 98.1 F (36.7 C), temperature source Oral, resp. rate 18, height 5\' 4"  (1.626 m), weight 74.8 kg, SpO2 97 %.  Labs: Lab Results  Component Value Date   WBC 8.5 02/20/2020   HGB 12.1 02/20/2020    HCT 37.6 02/20/2020   MCV 91.5 02/20/2020   PLT 229 02/20/2020    Recent Labs  Lab 02/17/20 0718 02/17/20 1640 02/20/20 0500  NA 139   < > 138  K 3.8   < > 4.1  CL 107   < > 103  CO2 21*   < > 26  BUN 13   < > 17  CREATININE 0.83   < > 0.87  CALCIUM 8.7*   < > 9.0  PROT 7.2  --   --   BILITOT 1.1  --   --   ALKPHOS 84  --   --   ALT 54*  --   --   AST 52*  --   --   GLUCOSE 251*   < > 193*   < > = values in this interval not displayed.   Lab Results  Component Value Date   CHOL 165 02/17/2020   HDL 34 (L) 02/17/2020   LDLCALC 95 02/17/2020   TRIG 181 (H) 02/17/2020   BNP (last 3 results) Recent Labs    02/17/20 1528  BNP 1,502.6*    ProBNP (last 3 results) No results for input(s): PROBNP in the last 8760 hours.   Diagnostic Studies/Procedures   No results found.  Discharge Medications   Allergies as of 02/20/2020      Reactions   Latex Anaphylaxis   Penicillins Hives   Codeine Rash   Tape Rash      Medication List    TAKE these medications   aspirin EC 81 MG tablet Take 1 tablet (81 mg total) by mouth daily. Swallow whole.   atorvastatin 80 MG tablet Commonly known as: LIPITOR Take 1 tablet (80 mg total) by mouth at bedtime.   carvedilol 6.25 MG tablet Commonly known as: COREG Take 1 tablet (6.25 mg total) by mouth 2 (two) times daily with a meal.   DAYQUIL/NYQUIL COLD/FLU RELIEF PO Take 2 capsules by mouth every 6 (six) hours as needed (cold / congestion symptoms).   metFORMIN 500 MG tablet Commonly known as: GLUCOPHAGE Take 1 tablet (500 mg total) by mouth 2 (two) times daily with a meal.   nitroGLYCERIN 0.4 MG SL tablet Commonly known as: NITROSTAT Place 1 tablet (0.4 mg total) under the tongue every 5 (five) minutes x 3 doses as needed for chest pain.   sacubitril-valsartan 24-26 MG Commonly known as: ENTRESTO Take 1 tablet by mouth 2 (two) times daily.   spironolactone 25 MG tablet Commonly known as: ALDACTONE Take 1 tablet  (25 mg total) by mouth daily. Start taking on: February 21, 2020   ticagrelor 90 MG Tabs tablet Commonly known as: BRILINTA Take 1 tablet (90 mg total) by mouth 2 (two) times daily.       Disposition   The patient will be discharged in stable condition to home. Discharge Instructions  Amb Referral to Cardiac Rehabilitation   Complete by: As directed    Diagnosis:  Coronary Stents NSTEMI     After initial evaluation and assessments completed: Virtual Based Care may be provided alone or in conjunction with Phase 2 Cardiac Rehab based on patient barriers.: Yes      Follow-up Information    [facility]. Go on 03/15/2020.   Why: You have a hospital followup appointment for Primary Care Physician followup on 03/15/2020 at 2:30 pm. Contact information: [place] [phone]       [facility] Follow up on 02/24/2020.   Specialty: Cardiology Why: 8:30 AM  [facility]  Contact information: [address] [id number] [place] [id number]                Duration of Discharge Encounter: Greater than 35 minutes   Signed, Nelida Gores  02/20/2020, 5:14 PM

## 2020-02-20 NOTE — Care Management Important Message (Signed)
Important Message  Patient Details  Name: Becky Boyle MRN: [id number] Date of Birth: October 10, 1950   Medicare Important Message Given:  Yes     Shelda Altes 02/20/2020, 9:18 AM

## 2020-02-20 NOTE — Progress Notes (Signed)
CARDIAC REHAB PHASE I   Offered to walk with pt, pt feeling fatigued today, unsure as to why. Reinforced importance of lifevest, daily weights, and ambulation. Pt hopeful for d/c today. Denies any further questions or concerns. Pt referred to [facility].  [id number] Rufina Falco, RN BSN 02/20/2020 10:54 AM

## 2020-02-20 NOTE — Progress Notes (Signed)
PICC line removed. Instructions provided to rest in bed for at least 30 minutes and leave dressing in place for 24 hours. Pt verbalized understanding.

## 2020-02-20 NOTE — Progress Notes (Signed)
Patient ID: Becky Boyle, female   DOB: 10/26/1950, 69 y.o.   MRN: [id number]     Advanced Heart Failure Rounding Note  PCP-Cardiologist: No primary care provider on file.   Subjective:    No complaints today.  No dyspnea or chest pain.   Echo: EF 25-30% with septal/inferior/apical WMAs, normal RV.    Objective:   Weight Range: 74.8 kg Body mass index is 28.31 kg/m.   Vital Signs:   Temp:  [98 F (36.7 C)-98.1 F (36.7 C)] 98.1 F (36.7 C) (07/23 0807) Pulse Rate:  [79-94] 82 (07/23 0807) Resp:  [17-21] 18 (07/23 0807) BP: (112-130)/(66-99) 121/69 (07/23 0807) SpO2:  [96 %-98 %] 97 % (07/23 0807) Weight:  [74.8 kg] 74.8 kg (07/23 0232) Last BM Date: 02/19/20  Weight change: Filed Weights   02/18/20 0600 02/19/20 0500 02/20/20 0232  Weight: 73 kg 74.9 kg 74.8 kg    Intake/Output:   Intake/Output Summary (Last 24 hours) at 02/20/2020 1309 Last data filed at 02/20/2020 0800 Gross per 24 hour  Intake 600 ml  Output --  Net 600 ml      Physical Exam    General: NAD Neck: No JVD, no thyromegaly or thyroid nodule.  Lungs: Clear to auscultation bilaterally with normal respiratory effort. CV: Nondisplaced PMI.  Heart regular S1/S2, no S3/S4, no murmur.  No peripheral edema.   Abdomen: Soft, nontender, no hepatosplenomegaly, no distention.  Skin: Intact without lesions or rashes.  Neurologic: Alert and oriented x 3.  Psych: Normal affect. Extremities: No clubbing or cyanosis.  HEENT: Normal.    Telemetry   NSR 80s (personally reviewed).   Labs    CBC Recent Labs    02/19/20 0538 02/20/20 0500  WBC 9.4 8.5  HGB 13.0 12.1  HCT 39.6 37.6  MCV 90.8 91.5  PLT 245 106   Basic Metabolic Panel Recent Labs    02/19/20 0538 02/20/20 0500  NA 136 138  K 3.8 4.1  CL 102 103  CO2 25 26  GLUCOSE 181* 193*  BUN 16 17  CREATININE 0.90 0.87  CALCIUM 8.9 9.0   Liver Function Tests No results for input(s): AST, ALT, ALKPHOS, BILITOT, PROT, ALBUMIN in the  last 72 hours. No results for input(s): LIPASE, AMYLASE in the last 72 hours. Cardiac Enzymes No results for input(s): CKTOTAL, CKMB, CKMBINDEX, TROPONINI in the last 72 hours.  BNP: BNP (last 3 results) Recent Labs    02/17/20 1528  BNP 1,502.6*    ProBNP (last 3 results) No results for input(s): PROBNP in the last 8760 hours.   D-Dimer No results for input(s): DDIMER in the last 72 hours. Hemoglobin A1C No results for input(s): HGBA1C in the last 72 hours. Fasting Lipid Panel No results for input(s): CHOL, HDL, LDLCALC, TRIG, CHOLHDL, LDLDIRECT in the last 72 hours. Thyroid Function Tests Recent Labs    02/17/20 1528  TSH 3.981    Other results:   Imaging    No results found.   Medications:     Scheduled Medications: . aspirin  81 mg Oral Daily  . atorvastatin  80 mg Oral QHS  . carvedilol  6.25 mg Oral BID WC  . Chlorhexidine Gluconate Cloth  6 each Topical Daily  . heparin injection (subcutaneous)  5,000 Units Subcutaneous Q8H  . insulin aspart  0-15 Units Subcutaneous TID WC  . insulin aspart  0-5 Units Subcutaneous QHS  . insulin aspart  0-5 Units Subcutaneous QHS  . mouth rinse  15 mL  Mouth Rinse BID  . sacubitril-valsartan  1 tablet Oral BID  . sodium chloride flush  10-40 mL Intracatheter Q12H  . sodium chloride flush  3 mL Intravenous Q12H  . sodium chloride flush  3 mL Intravenous Q12H  . spironolactone  25 mg Oral Daily  . ticagrelor  90 mg Oral BID    Infusions: . sodium chloride 10 mL/hr at 02/17/20 1132  . sodium chloride Stopped (02/18/20 1146)  . sodium chloride      PRN Medications: sodium chloride, acetaminophen, ALPRAZolam, nitroGLYCERIN, ondansetron (ZOFRAN) IV, sodium chloride flush, sodium chloride flush   Assessment/Plan   1. CAD: NSTEMI, HS-TnI currently not markedly high.  I suspect there was a plaque rupture event involving the LAD perhaps a couple of days prior to admission and she developed CHF since that time.  Now  s/p overlapping DES to LAD.  There is a chronically occluded RCA with collaterals and moderate stenosis in PLOM which is unlikely to be hemodynamically significant.  - Continue ASA, ticagrelor.  - Statin.  2. Acute systolic CHF: Ischemic cardiomyopathy.  EF 25-30% range on LV-gram and on echo.  Severe dyspnea at admission.  Elevated filling pressures on RHC but relatively preserved cardiac output.  She now looks euvolemic with CVP 7 and co-ox 68%.  - Does not need Lasix.  - Continue Entresto 24/26 bid.  - Continue spironolactone 25 mg daily.  - Continue Coreg 6.25 mg bid.  - Will need Lifevest at discharge.  3. Diabetes: New diagnosis.  Will need SSI for now.  Home on metformin, eventual SGLT2 inhibitor.  4. Disposition: Home today.  Will need Lifevest.  Followup CHF clinic 10-14 days.  Followup PCP for new diabetes diagnosis.  Meds for home: ASA 81, ticagrelor 90 bid, atorvastatin 80 daily, Coreg 6.25 mg bid, Entresto 24/26 bid, spironolactone 25 daily, metformin 500 bid.   Length of Stay: 3  Loralie Champagne, MD  02/20/2020, 1:09 PM  Advanced Heart Failure Team Pager [phone] (M-F; 7a - 4p)  Please contact [facility] for night-coverage after hours (4p -7a ) and weekends on [email or web address]

## 2020-02-24 ENCOUNTER — Encounter (HOSPITAL_COMMUNITY): Payer: Self-pay | Admitting: Cardiology

## 2020-02-24 ENCOUNTER — Ambulatory Visit (HOSPITAL_COMMUNITY)
Admission: RE | Admit: 2020-02-24 | Discharge: 2020-02-24 | Disposition: A | Discharge status: Home / Self Care | Payer: Medicare HMO | Source: Ambulatory Visit | Attending: Cardiology | Admitting: Cardiology

## 2020-02-24 ENCOUNTER — Other Ambulatory Visit: Payer: Self-pay

## 2020-02-24 VITALS — BP 132/80 | HR 78 | Wt 164.4 lb

## 2020-02-24 DIAGNOSIS — I255 Ischemic cardiomyopathy: Secondary | ICD-10-CM | POA: Insufficient documentation

## 2020-02-24 DIAGNOSIS — Z79899 Other long term (current) drug therapy: Secondary | ICD-10-CM | POA: Diagnosis not present

## 2020-02-24 DIAGNOSIS — Z7982 Long term (current) use of aspirin: Secondary | ICD-10-CM | POA: Diagnosis not present

## 2020-02-24 DIAGNOSIS — E785 Hyperlipidemia, unspecified: Secondary | ICD-10-CM | POA: Insufficient documentation

## 2020-02-24 DIAGNOSIS — E119 Type 2 diabetes mellitus without complications: Secondary | ICD-10-CM | POA: Diagnosis not present

## 2020-02-24 DIAGNOSIS — I5022 Chronic systolic (congestive) heart failure: Secondary | ICD-10-CM | POA: Insufficient documentation

## 2020-02-24 DIAGNOSIS — Z7984 Long term (current) use of oral hypoglycemic drugs: Secondary | ICD-10-CM | POA: Diagnosis not present

## 2020-02-24 DIAGNOSIS — I2510 Atherosclerotic heart disease of native coronary artery without angina pectoris: Secondary | ICD-10-CM | POA: Diagnosis not present

## 2020-02-24 DIAGNOSIS — R69 Illness, unspecified: Secondary | ICD-10-CM | POA: Diagnosis not present

## 2020-02-24 DIAGNOSIS — Z955 Presence of coronary angioplasty implant and graft: Secondary | ICD-10-CM | POA: Diagnosis not present

## 2020-02-24 DIAGNOSIS — I252 Old myocardial infarction: Secondary | ICD-10-CM | POA: Diagnosis not present

## 2020-02-24 DIAGNOSIS — Z833 Family history of diabetes mellitus: Secondary | ICD-10-CM | POA: Diagnosis not present

## 2020-02-24 LAB — BASIC METABOLIC PANEL
Anion gap: 10 (ref 5–15)
BUN: 18 mg/dL (ref 8–23)
CO2: 24 mmol/L (ref 22–32)
Calcium: 9.7 mg/dL (ref 8.9–10.3)
Chloride: 103 mmol/L (ref 98–111)
Creatinine, Ser: 0.98 mg/dL (ref 0.44–1.00)
GFR calc Af Amer: >60 mL/min (ref >60)
GFR calc non Af Amer: 59 mL/min — ABNORMAL LOW (ref >60)
Glucose, Bld: 156 mg/dL — ABNORMAL HIGH (ref 70–99)
Potassium: 4.8 mmol/L (ref 3.5–5.1)
Sodium: 137 mmol/L (ref 135–145)

## 2020-02-24 LAB — CBC
HCT: 43.7 % (ref 36.0–46.0)
Hemoglobin: 13.8 g/dL (ref 12.0–15.0)
MCH: 29 pg (ref 26.0–34.0)
MCHC: 31.6 g/dL (ref 30.0–36.0)
MCV: 91.8 fL (ref 80.0–100.0)
Platelets: 329 10*3/uL (ref 150–400)
RBC: 4.76 MIL/uL (ref 3.87–5.11)
RDW: 13.2 % (ref 11.5–15.5)
WBC: 10.6 10*3/uL — ABNORMAL HIGH (ref 4.0–10.5)
nRBC: 0 % (ref 0.0–0.2)

## 2020-02-24 MED ORDER — FUROSEMIDE 20 MG PO TABS
20.0000 mg | ORAL_TABLET | Freq: Every day | ORAL | 3 refills | Status: DC
Start: 2020-02-24 — End: 2020-05-13

## 2020-02-24 MED ORDER — SACUBITRIL-VALSARTAN 49-51 MG PO TABS
1.0000 | ORAL_TABLET | Freq: Two times a day (BID) | ORAL | 5 refills | Status: DC
Start: 2020-02-24 — End: 2021-08-17

## 2020-02-24 NOTE — Patient Instructions (Addendum)
INCREASE Entresto 49/51mg  (1 tab) twice a day   Take Lasix 40mg  (2 tab) for 2 days THEN take 1 tab daily every day after that   You have been given a script for a glucometer and test strips. Take it to your local pharmacy  Labs today and repeat in 10 days We will only contact you if something comes back abnormal or we need to make some changes. Otherwise no news is good news!   You have been referred to cardiac rehab to [facility].  They will call you to schedule an appointment.   Your physician recommends that you schedule a follow-up appointment in: 2 weeks with Dr Aundra Dubin    Please call office at [phone] option 2 if you have any questions or concerns.   At the [facility], you and your health needs are our priority. As part of our continuing mission to provide you with exceptional heart care, we have created designated Provider Care Teams. These Care Teams include your primary Cardiologist (physician) and Advanced Practice Providers (APPs- Physician Assistants and Nurse Practitioners) who all work together to provide you with the care you need, when you need it.   You may see any of the following providers on your designated Care Team at your next follow up: Marland Kitchen Dr Glori Bickers . Dr Loralie Champagne . Darrick Grinder, NP . Lyda Jester, PA . Audry Riles, PharmD   Please be sure to bring in all your medications bottles to every appointment.

## 2020-02-24 NOTE — Progress Notes (Signed)
PCP: Patient, No Pcp Per Cardiology: Dr. Aundra Dubin  69 y.o. with history of NSTEMI and ischemic cardiomyopathy presents for followup of CHF and CAD.  Patient was admitted in 7/21 with NSTEMI.  Cath showed LAD culprit, patient had DES x 2 to proximal LAD.  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 Echo was done showing EF 25-30%, septal akinesis. Patient was started on cardiac meds and was discharged home with a Lifevest. A new diagnosis of diabetes was made and she was started on metformin.   Weight stable since discharge. She has dyspnea with moderate exertion but usually ok walking on flat ground.  No chest pain.  She has had episodes where she will wake up at night short of breath concerning for PND. She is anxious.   REDS clip 46%  Labs (7/21): K 4.1, creatinine 0.87  PMH: 1. Type 2 diabetes: New diagnosis in 7/21.  2. Hyperlipidemia 3. Chronic systolic CHF: Ischemic cardiomyopathy.   - Echo (7/21): EF 25-30%, septal akinesis.   4. CAD: NSTEMI in 7/21.  - LHC: DES x 2 to proximal LAD, CTO RCA with collaterals, moderate PLOM stenosis.   Social History   Socioeconomic History  . Marital status: Married    Spouse name: Not on file  . Number of children: Not on file  . Years of education: Not on file  . Highest education level: Not on file  Occupational History  . Not on file  Tobacco Use  . Smoking status: Never Smoker  . Smokeless tobacco: Never Used  Substance and Sexual Activity  . Alcohol use: No  . Drug use: No  . Sexual activity: Not on file  Other Topics Concern  . Not on file  Social History Narrative  . Not on file   Social Determinants of Health   Financial Resource Strain:   . Difficulty of Paying Living Expenses:   Food Insecurity:   . Worried About Charity fundraiser in the Last Year:   . Arboriculturist in the Last Year:   Transportation Needs:   . Film/video editor (Medical):   Marland Kitchen Lack of Transportation  (Non-Medical):   Physical Activity:   . Days of Exercise per Week:   . Minutes of Exercise per Session:   Stress:   . Feeling of Stress :   Social Connections:   . Frequency of Communication with Friends and Family:   . Frequency of Social Gatherings with Friends and Family:   . Attends Religious Services:   . Active Member of Clubs or Organizations:   . Attends Archivist Meetings:   Marland Kitchen Marital Status:   Intimate Partner Violence:   . Fear of Current or Ex-Partner:   . Emotionally Abused:   Marland Kitchen Physically Abused:   . Sexually Abused:    Family History  Problem Relation Age of Onset  . Alzheimer's disease Mother   . Cancer Father   . Diabetes Son   . High blood pressure Son    ROS: All systems reviewed and negative except as noted in HPI.   Current Outpatient Medications  Medication Sig Dispense Refill  . aspirin EC 81 MG tablet Take 1 tablet (81 mg total) by mouth daily. Swallow whole. 150 tablet 2  . atorvastatin (LIPITOR) 80 MG tablet Take 1 tablet (80 mg total) by mouth at bedtime. 30 tablet 5  . carvedilol (COREG) 6.25 MG tablet Take 1 tablet (6.25 mg total) by mouth 2 (  two) times daily with a meal. 60 tablet 5  . metFORMIN (GLUCOPHAGE) 500 MG tablet Take 1 tablet (500 mg total) by mouth 2 (two) times daily with a meal. 60 tablet 5  . nitroGLYCERIN (NITROSTAT) 0.4 MG SL tablet Place 1 tablet (0.4 mg total) under the tongue every 5 (five) minutes x 3 doses as needed for chest pain. 25 tablet 2  . spironolactone (ALDACTONE) 25 MG tablet Take 1 tablet (25 mg total) by mouth daily. 30 tablet 5  . ticagrelor (BRILINTA) 90 MG TABS tablet Take 1 tablet (90 mg total) by mouth 2 (two) times daily. 60 tablet 11  . furosemide (LASIX) 20 MG tablet Take 1 tablet (20 mg total) by mouth daily. 90 tablet 3  . sacubitril-valsartan (ENTRESTO) 49-51 MG Take 1 tablet by mouth 2 (two) times daily. 60 tablet 5   No current facility-administered medications for this encounter.   BP (!)  132/80   Pulse 78   Wt 74.6 kg (164 lb 6.4 oz)   SpO2 99%   BMI 28.22 kg/m  General: NAD Neck: JVP 8 cm with HJR, no thyromegaly or thyroid nodule.  Lungs: Clear to auscultation bilaterally with normal respiratory effort. CV: Nondisplaced PMI.  Heart regular S1/S2, no S3/S4, no murmur.  No peripheral edema.  No carotid bruit.  Normal pedal pulses.  Abdomen: Soft, nontender, no hepatosplenomegaly, no distention.  Skin: Intact without lesions or rashes.  Neurologic: Alert and oriented x 3.  Psych: Normal affect. Extremities: No clubbing or cyanosis.  HEENT: Normal.   Assessment/Plan: 1. CAD: NSTEMI in 7/21 with DES x 2 to proximal LAD.  There was a chronic total occlusion of the RCA and moderate stenosis in the PLOM.  No further chest pain.  - ASA 81 mg daily + Brilinta 90 mg bid.  - Atorvastatin 80 mg daily.  - Refer to cardiac rehab at [facility].  2. Chronic systolic CHF: Ischemic cardiomyopathy.  Echo in 7/21 with EF 25-30%, akinetic septum.  NYHA class III symptoms, she is mildly volume overloaded by exam but REDS clip reading is significantly elevated (46%).  - Start Lasix 40 mg daily x 2 days then 20 mg daily after that.  BMET today and in 10 days.  - Increase Entresto to 49/51 bid.  - Continue Coreg 6.25 mg bid.  - Continue spironolactone 25 mg daily.  - She will wear Lifevest until followup 3 month echo in 10/21, will decide on ICD need at that time.  3. Type 2 diabetes: New diagnosis, now on metformin.  Metformin may be causing loose stool.  - Continue metformin for now.  - Needs glucometer and test strips.  - Will refer her to [facility].  - Eventually would be a good SGLT2 inhibitor candidate.   Loralie Champagne 02/24/2020

## 2020-02-24 NOTE — Progress Notes (Signed)
CSW referred to assist patient with PCP. Patient reports she was auto assigned to a PCP in the [place] area and lives up in [place] reviewed patient's insurance card and PCP listed is [facility]. CSW instructed patient to call to obtain a new patient appointment. Patient verbalizes understanding and will return call to CSW if any issues arise. Raquel Sarna, [place], [place]

## 2020-03-08 ENCOUNTER — Ambulatory Visit (HOSPITAL_BASED_OUTPATIENT_CLINIC_OR_DEPARTMENT_OTHER)
Admission: RE | Admit: 2020-03-08 | Discharge: 2020-03-08 | Disposition: A | Discharge status: Home / Self Care | Payer: Medicare HMO | Source: Ambulatory Visit | Attending: Cardiology | Admitting: Cardiology

## 2020-03-08 ENCOUNTER — Other Ambulatory Visit: Payer: Self-pay

## 2020-03-08 ENCOUNTER — Ambulatory Visit (HOSPITAL_COMMUNITY)
Admission: RE | Admit: 2020-03-08 | Discharge: 2020-03-08 | Disposition: A | Discharge status: Home / Self Care | Payer: Medicare HMO | Source: Ambulatory Visit | Attending: Cardiology | Admitting: Cardiology

## 2020-03-08 ENCOUNTER — Encounter (HOSPITAL_COMMUNITY): Payer: Self-pay | Admitting: Cardiology

## 2020-03-08 ENCOUNTER — Encounter (HOSPITAL_COMMUNITY): Payer: Self-pay

## 2020-03-08 VITALS — BP 114/50 | HR 84 | Ht 64.0 in | Wt 155.8 lb

## 2020-03-08 DIAGNOSIS — E785 Hyperlipidemia, unspecified: Secondary | ICD-10-CM | POA: Diagnosis not present

## 2020-03-08 DIAGNOSIS — I5022 Chronic systolic (congestive) heart failure: Secondary | ICD-10-CM

## 2020-03-08 DIAGNOSIS — Z7984 Long term (current) use of oral hypoglycemic drugs: Secondary | ICD-10-CM | POA: Insufficient documentation

## 2020-03-08 DIAGNOSIS — Z955 Presence of coronary angioplasty implant and graft: Secondary | ICD-10-CM | POA: Diagnosis not present

## 2020-03-08 DIAGNOSIS — Z833 Family history of diabetes mellitus: Secondary | ICD-10-CM | POA: Diagnosis not present

## 2020-03-08 DIAGNOSIS — E119 Type 2 diabetes mellitus without complications: Secondary | ICD-10-CM | POA: Insufficient documentation

## 2020-03-08 DIAGNOSIS — Z7982 Long term (current) use of aspirin: Secondary | ICD-10-CM | POA: Diagnosis not present

## 2020-03-08 DIAGNOSIS — I252 Old myocardial infarction: Secondary | ICD-10-CM | POA: Insufficient documentation

## 2020-03-08 DIAGNOSIS — I2510 Atherosclerotic heart disease of native coronary artery without angina pectoris: Secondary | ICD-10-CM | POA: Insufficient documentation

## 2020-03-08 DIAGNOSIS — Z7902 Long term (current) use of antithrombotics/antiplatelets: Secondary | ICD-10-CM | POA: Diagnosis not present

## 2020-03-08 DIAGNOSIS — I255 Ischemic cardiomyopathy: Secondary | ICD-10-CM | POA: Insufficient documentation

## 2020-03-08 DIAGNOSIS — I5042 Chronic combined systolic (congestive) and diastolic (congestive) heart failure: Secondary | ICD-10-CM | POA: Diagnosis not present

## 2020-03-08 DIAGNOSIS — Z79899 Other long term (current) drug therapy: Secondary | ICD-10-CM | POA: Diagnosis not present

## 2020-03-08 LAB — BASIC METABOLIC PANEL
Anion gap: 14 (ref 5–15)
BUN: 60 mg/dL — ABNORMAL HIGH (ref 8–23)
CO2: 22 mmol/L (ref 22–32)
Calcium: 9.6 mg/dL (ref 8.9–10.3)
Chloride: 102 mmol/L (ref 98–111)
Creatinine, Ser: 2.22 mg/dL — ABNORMAL HIGH (ref 0.44–1.00)
GFR calc Af Amer: 26 mL/min — ABNORMAL LOW (ref >60)
GFR calc non Af Amer: 22 mL/min — ABNORMAL LOW (ref >60)
Glucose, Bld: 178 mg/dL — ABNORMAL HIGH (ref 70–99)
Potassium: 4.8 mmol/L (ref 3.5–5.1)
Sodium: 138 mmol/L (ref 135–145)

## 2020-03-08 MED ORDER — DAPAGLIFLOZIN PROPANEDIOL 10 MG PO TABS
10.0000 mg | ORAL_TABLET | Freq: Every day | ORAL | 6 refills | Status: DC
Start: 2020-03-08 — End: 2021-04-08

## 2020-03-08 MED ORDER — METFORMIN HCL 500 MG PO TABS: 500.0000 mg | ORAL_TABLET | Freq: Every day | ORAL | 1 refills | Status: DC

## 2020-03-08 NOTE — Progress Notes (Signed)
CSW informed by MD that pt is having trouble getting into [facility]- was told they are not accepting new patients.  CSW called their office and spoke with referral line coordinator- they report they have one provider who is accepting new patients at this time.  They were unable to make appt with CSW over the phone but report pt needs to come into the office and fill out new pt paperwork and make an appt in person.  CSW met with pt in clinic and informed of above- pt expressed understanding and intends to go into their office to do sometime this week.  Provided pt with my number in case she runs into any problems so I can assist as needed  Jorge Ny, [facility] Desk#: [phone] Cell#: [phone]

## 2020-03-08 NOTE — Patient Instructions (Signed)
Decrease Metformin to 500 mg Daily  Start Farxiga 10 mg Daily  Labs done today, your results will be available in MyChart, we will contact you for abnormal readings.  Labs needed again in 10-14 days, we have provided you with a prescription to have this done locally  You have been referred to Cardiac Rehab at [facility], they will call you to schedule  Please follow up with our heart failure pharmacist in 3-4 weeks  Your physician recommends that you schedule a follow-up appointment in: 2 months with an echocardiogram  If you have any questions or concerns before your next appointment please send [place] a message through [place] or call our office at [phone].    TO LEAVE A MESSAGE FOR THE NURSE SELECT OPTION 2, PLEASE LEAVE A MESSAGE INCLUDING: . YOUR NAME . DATE OF BIRTH . CALL BACK NUMBER . REASON FOR CALL**this is important as we prioritize the call backs  [place] AS LONG AS YOU CALL BEFORE 4:00 PM  At the [facility], you and your health needs are our priority. As part of our continuing mission to provide you with exceptional heart care, we have created designated Provider Care Teams. These Care Teams include your primary Cardiologist (physician) and Advanced Practice Providers (APPs- Physician Assistants and Nurse Practitioners) who all work together to provide you with the care you need, when you need it.   You may see any of the following providers on your designated Care Team at your next follow up: Marland Kitchen Dr Glori Bickers . Dr Loralie Champagne . Darrick Grinder, NP . Lyda Jester, PA . Audry Riles, PharmD   Please be sure to bring in all your medications bottles to every appointment.

## 2020-03-08 NOTE — Progress Notes (Signed)
PCP: Patient, No Pcp Per Cardiology: Dr. Aundra Dubin  69 y.o. with history of NSTEMI and ischemic cardiomyopathy presents for followup of CHF and CAD.  Patient was admitted in 7/21 with NSTEMI.  Cath showed LAD culprit, patient had DES x 2 to proximal LAD.  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 Echo was done showing EF 25-30%, septal akinesis. Patient was started on cardiac meds and was discharged home with a Lifevest. A new diagnosis of diabetes was made and she was started on metformin.   At last appointment, she was volume overloaded and Lasix was started.  Weight is down 9 lbs.  She reports a couple of nights where she woke up short of breath but this has been more rare.  She still gets winded walking up stairs.  No dyspnea walking on flat ground.  Mild dyspnea with moderate housework like changing bed sheets.  No orthopnea.  Overall, breathing is better on Lasix.   REDS clip 36%  Labs (7/21): K 4.1, creatinine 0.87 => 0.98  PMH: 1. Type 2 diabetes: New diagnosis in 7/21.  2. Hyperlipidemia 3. Chronic systolic CHF: Ischemic cardiomyopathy.   - Echo (7/21): EF 25-30%, septal akinesis.   4. CAD: NSTEMI in 7/21.  - LHC: DES x 2 to proximal LAD, CTO RCA with collaterals, moderate PLOM stenosis.   Social History   Socioeconomic History  . Marital status: Married    Spouse name: Not on file  . Number of children: Not on file  . Years of education: Not on file  . Highest education level: Not on file  Occupational History  . Not on file  Tobacco Use  . Smoking status: Never Smoker  . Smokeless tobacco: Never Used  Substance and Sexual Activity  . Alcohol use: No  . Drug use: No  . Sexual activity: Not on file  Other Topics Concern  . Not on file  Social History Narrative  . Not on file   Social Determinants of Health   Financial Resource Strain:   . Difficulty of Paying Living Expenses:   Food Insecurity:   . Worried About Paediatric nurse in the Last Year:   . Arboriculturist in the Last Year:   Transportation Needs:   . Film/video editor (Medical):   Marland Kitchen Lack of Transportation (Non-Medical):   Physical Activity:   . Days of Exercise per Week:   . Minutes of Exercise per Session:   Stress:   . Feeling of Stress :   Social Connections:   . Frequency of Communication with Friends and Family:   . Frequency of Social Gatherings with Friends and Family:   . Attends Religious Services:   . Active Member of Clubs or Organizations:   . Attends Archivist Meetings:   Marland Kitchen Marital Status:   Intimate Partner Violence:   . Fear of Current or Ex-Partner:   . Emotionally Abused:   Marland Kitchen Physically Abused:   . Sexually Abused:    Family History  Problem Relation Age of Onset  . Alzheimer's disease Mother   . Cancer Father   . Diabetes Son   . High blood pressure Son    ROS: All systems reviewed and negative except as noted in HPI.   Current Outpatient Medications  Medication Sig Dispense Refill  . aspirin EC 81 MG tablet Take 1 tablet (81 mg total) by mouth daily. Swallow whole. 150 tablet 2  . atorvastatin (LIPITOR)  80 MG tablet Take 1 tablet (80 mg total) by mouth at bedtime. 30 tablet 5  . Blood Glucose Monitoring Suppl (ONETOUCH VERIO FLEX SYSTEM) w/Device KIT     . carvedilol (COREG) 6.25 MG tablet Take 1 tablet (6.25 mg total) by mouth 2 (two) times daily with a meal. 60 tablet 5  . furosemide (LASIX) 20 MG tablet Take 1 tablet (20 mg total) by mouth daily. 90 tablet 3  . Lancets (ONETOUCH DELICA PLUS [id number]) MISC Apply 1 each topically daily.    . metFORMIN (GLUCOPHAGE) 500 MG tablet Take 1 tablet (500 mg total) by mouth daily with breakfast. 30 tablet 1  . nitroGLYCERIN (NITROSTAT) 0.4 MG SL tablet Place 1 tablet (0.4 mg total) under the tongue every 5 (five) minutes x 3 doses as needed for chest pain. 25 tablet 2  . ONETOUCH VERIO test strip daily.    . sacubitril-valsartan (ENTRESTO) 49-51 MG  Take 1 tablet by mouth 2 (two) times daily. 60 tablet 5  . spironolactone (ALDACTONE) 25 MG tablet Take 1 tablet (25 mg total) by mouth daily. 30 tablet 5  . ticagrelor (BRILINTA) 90 MG TABS tablet Take 1 tablet (90 mg total) by mouth 2 (two) times daily. 60 tablet 11  . dapagliflozin propanediol (FARXIGA) 10 MG TABS tablet Take 1 tablet (10 mg total) by mouth daily before breakfast. 30 tablet 6   No current facility-administered medications for this encounter.   BP (!) 114/50   Pulse 84   Ht _0  (1.626 m)   Wt 70.7 kg (155 lb 12.8 oz)   SpO2 98%   BMI 26.74 kg/m  General: NAD Neck: No JVD, no thyromegaly or thyroid nodule.  Lungs: Clear to auscultation bilaterally with normal respiratory effort. CV: Nondisplaced PMI.  Heart regular S1/S2, no S3/S4, no murmur.  No peripheral edema.  No carotid bruit.  Normal pedal pulses.  Abdomen: Soft, nontender, no hepatosplenomegaly, no distention.  Skin: Intact without lesions or rashes.  Neurologic: Alert and oriented x 3.  Psych: Normal affect. Extremities: No clubbing or cyanosis.  HEENT: Normal.   Assessment/Plan: 1. CAD: NSTEMI in 7/21 with DES x 2 to proximal LAD.  There was a chronic total occlusion of the RCA and moderate stenosis in the PLOM.  No further chest pain.  - ASA 81 mg daily + Brilinta 90 mg bid.  - Atorvastatin 80 mg daily.  - Refer to cardiac rehab at [facility] (still has not been contacted).  2. Chronic systolic CHF: Ischemic cardiomyopathy.  Echo in 7/21 with EF 25-30%, akinetic septum.  NYHA class II-III symptoms, she is not significantly volume overloaded by exam though REDS clip is mildly elevated at 36%.   - Continue Lasix 20 mg daily.   - Continue Entresto 49/51 bid.  - Continue Coreg 6.25 mg bid.  - Continue spironolactone 25 mg daily.  - With mild volume overload, I will have her start dapagliflozin 10 mg daily.  BMET today and again in 10 days.  - She will wear Lifevest until followup 3 month echo in 10/21,  will decide on ICD need at that time.  3. Type 2 diabetes: New diagnosis, now on metformin.    - Starting Farxiga 10 mg daily as above, will decrease metformin to once daily.  - Will refer her to [place], will see if our social worker can help her get in.   Followup 3 wks with HF pharmacist for medication titration, see me in 2 months with  echo.   Loralie Champagne 03/08/2020

## 2020-03-08 NOTE — Progress Notes (Signed)
ReDS Vest / Clip - 03/08/20 1000      ReDS Vest / Clip   Station Marker A    Ruler Value 33    ReDS Value Range Low volume    ReDS Actual Value 36    Anatomical Comments sititng

## 2020-03-09 ENCOUNTER — Telehealth: Payer: Self-pay | Admitting: Physician Assistant

## 2020-03-09 NOTE — Telephone Encounter (Signed)
Dr. Aundra Dubin contacted me and requested that I call the patient based on her labs.  I left 2 messages for the patient. I then called her husband and left a voicemail for him as well.  The instructions are that she is to stop the Lasix for 2 days and then take it as needed only for weight gain of 3 pounds or more. Stop the [facility] for 2 days and then restart Do not start the [place] yet. Get lab work done again on Monday.  I requested that when they get the message, the either call the answering service back or contact the heart failure office tomorrow to let us know that they got the instructions.  I will route this to the heart failure team members so hopefully someone can get in touch with her tomorrow if she does not get the messages today.   Recent Labs  Lab 03/08/20 1024  NA 138  K 4.8  CL 102  CO2 22  BUN 60*  CREATININE 2.22*  CALCIUM 9.6  GLUCOSE 178*   Clothilde Tippetts, PA-C 03/09/2020 8:07 PM

## 2020-03-10 NOTE — Telephone Encounter (Signed)
Please make sure someone has spoken to Becky Boyle.

## 2020-03-11 ENCOUNTER — Encounter (HOSPITAL_COMMUNITY): Payer: Self-pay | Admitting: *Deleted

## 2020-03-11 NOTE — Telephone Encounter (Signed)
We have still been unable to reach pt, LM tcb on both pt and spouses phones, mychart message also sent

## 2020-03-11 NOTE — Telephone Encounter (Signed)
Attempted to reach pt by phone no answer/left VM for pt to return call.

## 2020-03-12 ENCOUNTER — Telehealth (HOSPITAL_COMMUNITY): Payer: Self-pay | Admitting: Cardiology

## 2020-03-12 ENCOUNTER — Encounter (HOSPITAL_COMMUNITY): Payer: Self-pay

## 2020-03-12 NOTE — Telephone Encounter (Signed)
Spoke with patient r/s lab appt to 03/15/20 @8 :30am

## 2020-03-15 ENCOUNTER — Inpatient Hospital Stay: Payer: Medicare HMO | Admitting: Family Medicine

## 2020-03-15 ENCOUNTER — Other Ambulatory Visit: Payer: Self-pay

## 2020-03-15 ENCOUNTER — Encounter (HOSPITAL_COMMUNITY): Payer: Self-pay

## 2020-03-15 ENCOUNTER — Ambulatory Visit (HOSPITAL_COMMUNITY)
Admission: RE | Admit: 2020-03-15 | Discharge: 2020-03-15 | Disposition: A | Discharge status: Home / Self Care | Payer: Medicare HMO | Source: Ambulatory Visit | Attending: Internal Medicine | Admitting: Internal Medicine

## 2020-03-15 DIAGNOSIS — I5042 Chronic combined systolic (congestive) and diastolic (congestive) heart failure: Secondary | ICD-10-CM

## 2020-03-15 LAB — BASIC METABOLIC PANEL
Anion gap: 11 (ref 5–15)
BUN: 27 mg/dL — ABNORMAL HIGH (ref 8–23)
CO2: 24 mmol/L (ref 22–32)
Calcium: 9.7 mg/dL (ref 8.9–10.3)
Chloride: 104 mmol/L (ref 98–111)
Creatinine, Ser: 1.15 mg/dL — ABNORMAL HIGH (ref 0.44–1.00)
GFR calc Af Amer: 57 mL/min — ABNORMAL LOW (ref >60)
GFR calc non Af Amer: 49 mL/min — ABNORMAL LOW (ref >60)
Glucose, Bld: 183 mg/dL — ABNORMAL HIGH (ref 70–99)
Potassium: 4.4 mmol/L (ref 3.5–5.1)
Sodium: 139 mmol/L (ref 135–145)

## 2020-03-16 ENCOUNTER — Ambulatory Visit (HOSPITAL_COMMUNITY): Payer: Medicare HMO

## 2020-03-16 ENCOUNTER — Encounter (HOSPITAL_COMMUNITY): Payer: Self-pay

## 2020-03-16 ENCOUNTER — Other Ambulatory Visit (HOSPITAL_COMMUNITY): Payer: Self-pay | Admitting: Cardiology

## 2020-03-18 ENCOUNTER — Other Ambulatory Visit (HOSPITAL_COMMUNITY): Payer: Medicare HMO

## 2020-03-18 DIAGNOSIS — R69 Illness, unspecified: Secondary | ICD-10-CM | POA: Diagnosis not present

## 2020-03-22 ENCOUNTER — Other Ambulatory Visit (HOSPITAL_COMMUNITY): Payer: Self-pay | Admitting: *Deleted

## 2020-03-22 ENCOUNTER — Ambulatory Visit (INDEPENDENT_AMBULATORY_CARE_PROVIDER_SITE_OTHER): Payer: Medicare HMO | Admitting: Nurse Practitioner

## 2020-03-22 ENCOUNTER — Encounter: Payer: Self-pay | Admitting: Nurse Practitioner

## 2020-03-22 ENCOUNTER — Telehealth: Payer: Self-pay | Admitting: Family Medicine

## 2020-03-22 ENCOUNTER — Other Ambulatory Visit: Payer: Self-pay

## 2020-03-22 ENCOUNTER — Encounter (HOSPITAL_COMMUNITY): Payer: Self-pay

## 2020-03-22 VITALS — BP 133/89 | HR 89 | Temp 97.2°F | Ht 64.0 in | Wt 156.2 lb

## 2020-03-22 DIAGNOSIS — I5041 Acute combined systolic (congestive) and diastolic (congestive) heart failure: Secondary | ICD-10-CM | POA: Diagnosis not present

## 2020-03-22 DIAGNOSIS — E1165 Type 2 diabetes mellitus with hyperglycemia: Secondary | ICD-10-CM | POA: Diagnosis not present

## 2020-03-22 DIAGNOSIS — I214 Non-ST elevation (NSTEMI) myocardial infarction: Secondary | ICD-10-CM | POA: Diagnosis not present

## 2020-03-22 DIAGNOSIS — H00014 Hordeolum externum left upper eyelid: Secondary | ICD-10-CM

## 2020-03-22 DIAGNOSIS — Z0001 Encounter for general adult medical examination with abnormal findings: Secondary | ICD-10-CM | POA: Diagnosis not present

## 2020-03-22 DIAGNOSIS — Z0000 Encounter for general adult medical examination without abnormal findings: Secondary | ICD-10-CM

## 2020-03-22 MED ORDER — CARVEDILOL 6.25 MG PO TABS: 6.2500 mg | ORAL_TABLET | Freq: Two times a day (BID) | ORAL | 5 refills | Status: DC

## 2020-03-22 MED ORDER — BACITRACIN 500 UNIT/GM EX OINT: 1.0000 "application " | TOPICAL_OINTMENT | Freq: Two times a day (BID) | CUTANEOUS | 0 refills | Status: DC

## 2020-03-22 MED ORDER — ONETOUCH DELICA PLUS LANCET33G MISC: 1.0000 | Freq: Every day | 2 refills | Status: AC

## 2020-03-22 MED ORDER — TICAGRELOR 90 MG PO TABS: 90.0000 mg | ORAL_TABLET | Freq: Two times a day (BID) | ORAL | 11 refills | Status: DC

## 2020-03-22 MED ORDER — ATORVASTATIN CALCIUM 80 MG PO TABS: 80.0000 mg | ORAL_TABLET | Freq: Every day | ORAL | 5 refills | Status: DC

## 2020-03-22 MED ORDER — SPIRONOLACTONE 25 MG PO TABS: 25.0000 mg | ORAL_TABLET | Freq: Every day | ORAL | 5 refills | Status: DC

## 2020-03-22 NOTE — Assessment & Plan Note (Signed)
Patient is in clinic today for a stye left upper eyelid.  Provided education to apply warm compress to close lead for 5 to 10 minutes 2-4 times daily to loosen the crust, then wash/massage eyelids with a cotton swab soaked in a mixture of baby shampoo and water. Started patient on bacitracin ophthalmic ointment twice daily. Follow-up with unresolved or worsening symptoms.   Rx sent to pharmacy.

## 2020-03-22 NOTE — Progress Notes (Signed)
New Patient Office Visit  Subjective:  Patient ID: Becky Boyle, female    DOB: 08/09/1950  Age: 69 y.o. MRN: [id number]  CC:  Chief Complaint  Patient presents with  . New Patient (Initial Visit)    HPI [name] presents for .   Encounter for general adult medical examination without abnormal findings  Physical  Patient's last physical exam was 1 year ago .  Weight: Appropriate for height (BMI less than 27%) ;  Blood Pressure: Normal (BP lgreaterthan 120/80) ;  Medical History: Patient history reviewed ; Family history reviewed ;  Allergies Reviewed: No change in current allergies ;  Medications Reviewed: Medications reviewed - no changes ;  Lipids: Normal lipid levels ;  Smoking: Life-long non-smoker ;  Physical Activity: Exercises at least 3 times per week ;  Alcohol/Drug Use: Is a non-drinker ; No illicit drug use ;  Patient is not afflicted from Stress Incontinence and Urge Incontinence  Safety: reviewed ; Patient wears a seat belt, has smoke detectors, has carbon monoxide detectors,  and wears sunscreen with extended sun exposure. Dental Care: no annual visit, brushes and flosses daily. Ophthalmology/Optometry: Annual visit.  Hearing loss: left ear  Vision impairments: lens inplant  Past Medical History:  Diagnosis Date  . Benign brain tumor [facility])     Past Surgical History:  Procedure Laterality Date  . ABDOMINAL HYSTERECTOMY    . BRAIN SURGERY  1989   tumor   . CORONARY STENT INTERVENTION N/A 02/17/2020   Procedure: CORONARY STENT INTERVENTION;  Surgeon: Jettie Booze, MD;  Location: [place] [facility];  Service: Cardiovascular;  Laterality: N/A;  . RIGHT/LEFT HEART CATH AND CORONARY ANGIOGRAPHY N/A 02/17/2020   Procedure: RIGHT/LEFT HEART CATH AND CORONARY ANGIOGRAPHY;  Surgeon: Jettie Booze, MD;  Location: [place] [facility];  Service: Cardiovascular;  Laterality: N/A;    Family History  Problem Relation Age of Onset  . Alzheimer's  disease Mother   . Cancer Father   . Diabetes Son   . High blood pressure Son     Social History   Socioeconomic History  . Marital status: Married    Spouse name: Not on file  . Number of children: Not on file  . Years of education: Not on file  . Highest education level: Not on file  Occupational History  . Not on file  Tobacco Use  . Smoking status: Never Smoker  . Smokeless tobacco: Never Used  Substance and Sexual Activity  . Alcohol use: No  . Drug use: No  . Sexual activity: Not on file  Other Topics Concern  . Not on file  Social History Narrative  . Not on file   Social Determinants of Health   Financial Resource Strain:   . Difficulty of Paying Living Expenses: Not on file  Food Insecurity:   . Worried About Charity fundraiser in the Last Year: Not on file  . Ran Out of Food in the Last Year: Not on file  Transportation Needs:   . Lack of Transportation (Medical): Not on file  . Lack of Transportation (Non-Medical): Not on file  Physical Activity:   . Days of Exercise per Week: Not on file  . Minutes of Exercise per Session: Not on file  Stress:   . Feeling of Stress : Not on file  Social Connections:   . Frequency of Communication with Friends and Family: Not on file  . Frequency of Social Gatherings with Friends and  Family: Not on file  . Attends Religious Services: Not on file  . Active Member of Clubs or Organizations: Not on file  . Attends Archivist Meetings: Not on file  . Marital Status: Not on file  Intimate Partner Violence:   . Fear of Current or Ex-Partner: Not on file  . Emotionally Abused: Not on file  . Physically Abused: Not on file  . Sexually Abused: Not on file    ROS Review of Systems  Constitutional: Negative.   HENT: Negative.   Eyes: Negative.   Respiratory: Negative.   Cardiovascular:       Recent heart attack , heart monitor  Gastrointestinal: Negative.   Endocrine: Positive for polyphagia.    Genitourinary: Negative.   Musculoskeletal: Negative.   Neurological: Negative.     Objective:   Today's Vitals: BP 133/89   Pulse 89   Temp (!) 97.2 F (36.2 C) (Temporal)   Ht '5\' 4"'  (1.626 m)   Wt 156 lb 3.2 oz (70.9 kg)   BMI 26.81 kg/m   Physical Exam Vitals reviewed.  Constitutional:      Appearance: Normal appearance. She is normal weight.  HENT:     Head: Normocephalic.     Right Ear: External ear normal. There is impacted cerumen.     Left Ear: External ear normal. There is impacted cerumen.     Nose: Nose normal.     Mouth/Throat:     Mouth: Mucous membranes are moist.     Pharynx: Oropharynx is clear.  Eyes:     Conjunctiva/sclera: Conjunctivae normal.     Pupils: Pupils are equal, round, and reactive to light.  Cardiovascular:     Pulses: Normal pulses.     Heart sounds: Normal heart sounds.     Comments: Heart monitor, recent heart attack Musculoskeletal:        General: Normal range of motion.     Cervical back: Normal range of motion.  Skin:    General: Skin is warm.  Neurological:     Mental Status: She is alert and oriented to person, place, and time.      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  No new concerns or symptoms of hypo-/hyperglycemia.  Patient is taking medication as prescribed and following up as directed.  Patient is advised to make an appointment with her eye  doctor as she has had a lens implant in the past.  Rx refill sent to pharmacy will follow up with patient pending lab results.      Relevant Medications   atorvastatin (LIPITOR) 80 MG tablet   Other Relevant Orders   Hemoglobin A1c   Microalbumin / creatinine urine ratio     Other   Annual physical exam - Primary    Patient is a 69 year old female who presents to clinic for an annual physical exam.  Completed head to toe assessment.  Provided preventative and health maintenance education.  Patient recently had a heart attack and currently is wearing a heart monitor.  Patient has never had a mammogram but will wait out due to her new heart monitor.  Patient has never had a shingles shot, pneumonia vaccine and is willing to take vaccines in the next couple of months.  No Pap today patient has had a hysterectomy.  Labs completed today TSH, lipid panel, hemoglobin A1c, urine microalbumin. Rx refill sent to pharmacy.  Follow-up in a year      Relevant Orders   Lipid Panel   TSH   Hordeolum externum of left upper eyelid    Patient is in clinic today for a stye left upper eyelid.  Provided education to apply warm compress to close lead for 5 to 10 minutes 2-4 times daily to loosen the crust, then wash/massage eyelids with a cotton swab soaked in a mixture of baby shampoo and water. Started patient on bacitracin ophthalmic ointment twice daily. Follow-up with unresolved or worsening symptoms.   Rx sent to pharmacy.      Relevant Medications   bacitracin 500  UNIT/GM ointment      Outpatient Encounter Medications as of 03/22/2020  Medication Sig  . aspirin EC 81 MG tablet Take 1 tablet (81 mg total) by mouth daily. Swallow whole.  Marland Kitchen atorvastatin (LIPITOR) 80 MG tablet Take 1 tablet (80 mg total) by mouth at bedtime.  . Blood Glucose Monitoring Suppl (ONETOUCH VERIO FLEX SYSTEM) w/Device KIT   . carvedilol (COREG) 6.25 MG tablet Take 1 tablet (6.25 mg total) by mouth 2 (two) times daily with a meal.  . dapagliflozin propanediol (FARXIGA) 10 MG TABS tablet Take 1 tablet (10 mg total) by mouth daily before breakfast.  . furosemide (LASIX) 20 MG tablet Take 1 tablet (20 mg total) by mouth daily.  . Lancets (ONETOUCH DELICA PLUS [id number]) MISC Apply 1 each topically daily.  . metFORMIN (GLUCOPHAGE) 500 MG tablet Take 1 tablet (500 mg total) by mouth daily with breakfast.  . nitroGLYCERIN (NITROSTAT) 0.4 MG SL tablet Place 1 tablet (0.4 mg total) under the tongue every 5 (five) minutes x 3 doses as needed for chest pain.  Glory Rosebush VERIO test strip CHECK BLOOD SUGAR ONCE DAILY OR AS DIRECTED  . sacubitril-valsartan (ENTRESTO) 49-51 MG Take 1 tablet by mouth 2 (two) times daily.  Marland Kitchen spironolactone (ALDACTONE) 25 MG tablet Take 1 tablet (25 mg total) by mouth daily.  . ticagrelor (BRILINTA) 90 MG TABS tablet Take 1 tablet (90 mg total) by mouth 2 (two) times daily.   No facility-administered encounter medications on file as of 03/22/2020.    Follow-up: Return in about 6 months (around 09/22/2020).   Ivy Lynn, NP

## 2020-03-22 NOTE — Assessment & Plan Note (Signed)
Concerning patient's type 2 diabetes with hyperglycemia patient is currently on Farxiga 10 mg tablet daily, Metformin 500 mg tablet daily patient checks blood sugars at home twice daily.  No new concerns or symptoms of hypo-/hyperglycemia.  Patient is taking medication as prescribed and following up as directed.  Patient is advised to make an appointment with her eye doctor as she has had a lens implant in the past.  Rx refill sent to pharmacy will follow up with patient pending lab results.

## 2020-03-22 NOTE — Patient Instructions (Addendum)
Health Maintenance After Age 68 After age 68, you are at a higher risk for certain long-term diseases and infections as well as injuries from falls. Falls are a major cause of broken bones and head injuries in people who are older than age 68. Getting regular preventive care can help to keep you healthy and well. Preventive care includes getting regular testing and making lifestyle changes as recommended by your health care provider. Talk with your health care provider about:  Which screenings and tests you should have. A screening is a test that checks for a disease when you have no symptoms.  A diet and exercise plan that is right for you. What should I know about screenings and tests to prevent falls? Screening and testing are the best ways to find a health problem early. Early diagnosis and treatment give you the best chance of managing medical conditions that are common after age 68. Certain conditions and lifestyle choices may make you more likely to have a fall. Your health care provider may recommend:  Regular vision checks. Poor vision and conditions such as cataracts can make you more likely to have a fall. If you wear glasses, make sure to get your prescription updated if your vision changes.  Medicine review. Work with your health care provider to regularly review all of the medicines you are taking, including over-the-counter medicines. Ask your health care provider about any side effects that may make you more likely to have a fall. Tell your health care provider if any medicines that you take make you feel dizzy or sleepy.  Osteoporosis screening. Osteoporosis is a condition that causes the bones to get weaker. This can make the bones weak and cause them to break more easily.  Blood pressure screening. Blood pressure changes and medicines to control blood pressure can make you feel dizzy.  Strength and balance checks. Your health care provider may recommend certain tests to check your  strength and balance while standing, walking, or changing positions.  Foot health exam. Foot pain and numbness, as well as not wearing proper footwear, can make you more likely to have a fall.  Depression screening. You may be more likely to have a fall if you have a fear of falling, feel emotionally low, or feel unable to do activities that you used to do.  Alcohol use screening. Using too much alcohol can affect your balance and may make you more likely to have a fall. What actions can I take to lower my risk of falls? General instructions  Talk with your health care provider about your risks for falling. Tell your health care provider if: ? You fall. Be sure to tell your health care provider about all falls, even ones that seem minor. ? You feel dizzy, sleepy, or off-balance.  Take over-the-counter and prescription medicines only as told by your health care provider. These include any supplements.  Eat a healthy diet and maintain a healthy weight. A healthy diet includes low-fat dairy products, low-fat (lean) meats, and fiber from whole grains, beans, and lots of fruits and vegetables. Home safety  Remove any tripping hazards, such as rugs, cords, and clutter.  Install safety equipment such as grab bars in bathrooms and safety rails on stairs.  Keep rooms and walkways well-lit. Activity   Follow a regular exercise program to stay fit. This will help you maintain your balance. Ask your health care provider what types of exercise are appropriate for you.  If you need a cane or   walker, use it as recommended by your health care provider.  Wear supportive shoes that have nonskid soles. Lifestyle  Do not drink alcohol if your health care provider tells you not to drink.  If you drink alcohol, limit how much you have: ? 0-1 drink a day for women. ? 0-2 drinks a day for men.  Be aware of how much alcohol is in your drink. In the [place] one drink equals one typical bottle of beer (12  oz), one-half glass of wine (5 oz), or one shot of hard liquor (1 oz).  Do not use any products that contain nicotine or tobacco, such as cigarettes and e-cigarettes. If you need help quitting, ask your health care provider. Summary  Having a healthy lifestyle and getting preventive care can help to protect your health and wellness after age 55.  Screening and testing are the best way to find a health problem early and help you avoid having a fall. Early diagnosis and treatment give you the best chance for managing medical conditions that are more common for people who are older than age 47.  Falls are a major cause of broken bones and head injuries in people who are older than age 37. Take precautions to prevent a fall at home.  Work with your health care provider to learn what changes you can make to improve your health and wellness and to prevent falls. This information is not intended to replace advice given to you by your health care provider. Make sure you discuss any questions you have with your health care provider. Document Revised: 11/07/2018 Document Reviewed: 05/30/2017 Elsevier Patient Education  [place], also known as a hordeolum, is a bump that forms on an eyelid. It may look like a pimple next to the eyelash. A stye can form inside the eyelid (internal stye) or outside the eyelid (external stye). A stye can cause redness, swelling, and pain on the eyelid. Styes are very common. Anyone can get them at any age. They usually occur in just one eye, but you may have more than one in either eye. What are the causes? A stye is caused by an infection. The infection is almost always caused by bacteria called Staphylococcus aureus. This is a common type of bacteria that lives on the skin. An internal stye may result from an infected oil-producing gland inside the eyelid. An external stye may be caused by an infection at the base of the eyelash (hair  follicle). What increases the risk? You are more likely to develop a stye if:  You have had a stye before.  You have any of these conditions: ? Diabetes. ? Red, itchy, inflamed eyelids (blepharitis). ? A skin condition such as seborrheic dermatitis or rosacea. ? High fat levels in your blood (lipids). What are the signs or symptoms? The most common symptom of a stye is eyelid pain. Internal styes are more painful than external styes. Other symptoms may include:  Painful swelling of your eyelid.  A scratchy feeling in your eye.  Tearing and redness of your eye.  Pus draining from the stye. How is this diagnosed? Your health care provider may be able to diagnose a stye just by examining your eye. The health care provider may also check to make sure:  You do not have a fever or other signs of a more serious infection.  The infection has not spread to other parts of your eye or areas around your eye.  How is this treated? Most styes will clear up in a few days without treatment or with warm compresses applied to the area. You may need to use antibiotic drops or ointment to treat an infection. In some cases, if your stye does not heal with routine treatment, your health care provider may drain pus from the stye using a thin blade or needle. This may be done if the stye is large, causing a lot of pain, or affecting your vision. Follow these instructions at home:  Take over-the-counter and prescription medicines only as told by your health care provider. This includes eye drops or ointments.  If you were prescribed an antibiotic medicine, apply or use it as told by your health care provider. Do not stop using the antibiotic even if your condition improves.  Apply a warm, wet cloth (warm compress) to your eye for 5-10 minutes, 4 times a day.  Clean the affected eyelid as directed by your health care provider.  Do not wear contact lenses or eye makeup until your stye has healed.  Do  not try to pop or drain the stye.  Do not rub your eye. Contact a health care provider if:  You have chills or a fever.  Your stye does not go away after several days.  Your stye affects your vision.  Your eyeball becomes swollen, red, or painful. Get help right away if:  You have pain when moving your eye around. Summary  A stye is a bump that forms on an eyelid. It may look like a pimple next to the eyelash.  A stye can form inside the eyelid (internal stye) or outside the eyelid (external stye). A stye can cause redness, swelling, and pain on the eyelid.  Your health care provider may be able to diagnose a stye just by examining your eye.  Apply a warm, wet cloth (warm compress) to your eye for 5-10 minutes, 4 times a day. This information is not intended to replace advice given to you by your health care provider. Make sure you discuss any questions you have with your health care provider. Document Revised: 06/29/2017 Document Reviewed: 03/29/2017 Elsevier Patient Education  2020 [employer].

## 2020-03-22 NOTE — Telephone Encounter (Signed)
Almyra Free [pharmacist] spoke to patient in clinic today that she was going to speak to cardiology and get back with patient to switch medication to Plavix.  I believe Almyra Free is working on it and we will get back to patient tomorrow.  If she has already taking Brilinta for 30 days she can wait till tomorrow to start Plavix.  Per cardiology orders, Brilinta has to be taking for 30 days before switching to a different medication.

## 2020-03-22 NOTE — Assessment & Plan Note (Signed)
Patient is a 69 year old female who presents to clinic for an annual physical exam.  Completed head to toe assessment.  Provided preventative and health maintenance education.  Patient recently had a heart attack and currently is wearing a heart monitor.  Patient has never had a mammogram but will wait out due to her new heart monitor.  Patient has never had a shingles shot, pneumonia vaccine and is willing to take vaccines in the next couple of months.  No Pap today patient has had a hysterectomy.  Labs completed today TSH, lipid panel, hemoglobin A1c, urine microalbumin. Rx refill sent to pharmacy.  Follow-up in a year

## 2020-03-23 ENCOUNTER — Telehealth (HOSPITAL_COMMUNITY): Payer: Self-pay | Admitting: Pharmacy Technician

## 2020-03-23 LAB — LIPID PANEL
Chol/HDL Ratio: 3.6 ratio (ref 0.0–4.4)
Cholesterol, Total: 96 mg/dL — ABNORMAL LOW (ref 100–199)
HDL: 27 mg/dL — ABNORMAL LOW (ref >39)
LDL Chol Calc (NIH): 37 mg/dL (ref 0–99)
Triglycerides: 197 mg/dL — ABNORMAL HIGH (ref 0–149)
VLDL Cholesterol Cal: 32 mg/dL (ref 5–40)

## 2020-03-23 LAB — HEMOGLOBIN A1C
Est. average glucose Bld gHb Est-mCnc: 189 mg/dL
Hgb A1c MFr Bld: 8.2 % — ABNORMAL HIGH (ref 4.8–5.6)

## 2020-03-23 LAB — MICROALBUMIN / CREATININE URINE RATIO
Creatinine, Urine: 108.8 mg/dL
Microalb/Creat Ratio: 20 mg/g creat (ref 0–29)
Microalbumin, Urine: 21.7 ug/mL

## 2020-03-23 LAB — TSH: TSH: 7.48 u[IU]/mL — ABNORMAL HIGH (ref 0.450–4.500)

## 2020-03-23 NOTE — Telephone Encounter (Signed)
Spoke with patient this morning regarding patient assistance for Brilinta ($250) and Farxiga ($47). Both applications will be at the check in desk for her to sign.   Will follow up.

## 2020-03-23 NOTE — Telephone Encounter (Signed)
Please make sure patient is aware of this.

## 2020-03-23 NOTE — Telephone Encounter (Signed)
I messaged cardiology (Dr. Aundra Dubin).  They will be determining next steps.  I deferred recommendation to them.  They would like for patient to stay on brilinta and are trying to get assistance for patient

## 2020-03-23 NOTE — Telephone Encounter (Signed)
Patient aware.  She has actually been to their office today and was able to get samples to last until she receives determination of whether she will be eligible for help.

## 2020-03-24 NOTE — Telephone Encounter (Signed)
Sent both applications to [employer] via fax.  Will follow up.

## 2020-03-26 NOTE — Progress Notes (Signed)
PCP: Patient, No Pcp Per Cardiology: Dr. Aundra Dubin  HPI:  69 y.o. with history of NSTEMI and ischemic cardiomyopathy presents for followup of CHF and CAD.  Patient was admitted in 7/21 with NSTEMI.  Cath showed LAD culprit, patient had DES x 2 to proximal LAD.  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 Echo was done showing EF 25-30%, septal akinesis. Patient was started on cardiac meds and was discharged home with a Lifevest. A new diagnosis of diabetes was made and she was started on metformin.   At previous appointment, she was volume overloaded and furosemide was started.  Weight was down 9 lbs.  She reported a couple of nights where she woke up short of breath but this had been more rare.  She still reported getting winded walking up stairs.  No dyspnea walking on flat ground.  Mild dyspnea with moderate housework like changing bed sheets.  No orthopnea.  Overall, breathing was better on furosemide.   Today she returns to HF clinic for pharmacist medication titration. At last visit with MD, large Scr bump was noted on labs. She was told to NOT start dapagliflozin, start taking furosemide PRN only and hold Entresto for 2 days before restarting. Labs were repeated with improvement (Scr down to 1.15). She made these changes, however, she also started [place] after her labs came back normal. Overall she is feeling well today. Notes some dizziness after taking her medications but this resolves fairly quickly and is not bothersome. No chest pain or palpitations. Can walk to her mailbox and back without stopping. SOB/DOE when going up stairs. Her weights at home have been stable at 156 lbs. She has only needed 1 dose of PRN furosemide in the last month. No LEE, PND or orthopnea. Taking all medications as prescribed and tolerating all mediations. Is wearing life vest today.    HF Medications: Carvedilol 6.25 mg BID Entresto 49/51 mg BID Spironolactone 25 mg  daily Dapagliflozin 10 mg daily Furosemide 20 mg daily PRN  Has the patient been experiencing any side effects to the medications prescribed?  no  Does the patient have any problems obtaining medications due to transportation or finances?   Yes - notes copays for her brand name medications are too high ($47.00/month). She has now been approved for patient assistance for [place]. Will send in application for [facility] patient assistance today.   Understanding of regimen: good Understanding of indications: good Potential of compliance: good Patient understands to avoid NSAIDs. Patient understands to avoid decongestants.    Pertinent Lab Values: . Serum creatinine 1.20, BUN 26, Potassium 4.1, Sodium 138  Vital Signs: . Weight: 156.8 lbs (last clinic weight: 155.8 lbs) . Blood pressure: 124/64  . Heart rate: 88   Assessment: 1. CAD: NSTEMI in 7/21 with DES x 2 to proximal LAD.  There was a chronic total occlusion of the RCA and moderate stenosis in the PLOM.  No further chest pain.  - ASA 81 mg daily + Brilinta 90 mg BID.  - Atorvastatin 80 mg daily.  - Referred to cardiac rehab at [facility] (still has not been contacted).  2. Chronic systolic CHF: Ischemic cardiomyopathy.  Echo in 7/21 with EF 25-30%, akinetic septum.   - NYHA class II-III symptoms, euvolemic on exam. - Labs: Scr stable at 1.20, K stable at 4.1. BNP pending. - Continue furosemide 20 mg PRN. - Increase carvedilol to 12.5 mg BID.    - Continue Entresto 49/51  mg BID.  - Continue spironolactone 25 mg daily.   - Continue dapagliflozin (Farxiga) 10 mg daily.  - She will wear Lifevest until followup 3 month echo in 10/21, will decide on ICD need at that time.  3. Type 2 diabetes:  - Continue dapagliflozin and metformin - Previously referred to [facility].   Plan: 1) Medication changes: Based on clinical presentation, vital signs and recent labs will increase carvedilol to 12.5 mg  BID.  2) Labs: Scr 1.20, K 4.1; BNP pending 3) Follow-up: 5 weeks with Dr. Rush Farmer, PharmD, BCPS, [facility], [place] [facility] Pharmacist [phone]

## 2020-03-30 ENCOUNTER — Ambulatory Visit (INDEPENDENT_AMBULATORY_CARE_PROVIDER_SITE_OTHER): Payer: Medicare HMO | Admitting: Nurse Practitioner

## 2020-03-30 ENCOUNTER — Encounter: Payer: Self-pay | Admitting: Nurse Practitioner

## 2020-03-30 ENCOUNTER — Other Ambulatory Visit: Payer: Self-pay

## 2020-03-30 VITALS — BP 123/68 | HR 65 | Temp 97.5°F | Ht 64.0 in | Wt 157.4 lb

## 2020-03-30 DIAGNOSIS — H6123 Impacted cerumen, bilateral: Secondary | ICD-10-CM | POA: Diagnosis not present

## 2020-03-30 MED ORDER — SALINE SPRAY 0.65 % NA SOLN: 1.0000 | NASAL | 3 refills | Status: AC | PRN

## 2020-03-30 NOTE — Progress Notes (Signed)
Established Patient Office Visit  Subjective:  Patient ID: Becky Boyle, female    DOB: [birth year]  Age: 69 y.o. MRN: [id number]  CC:  Chief Complaint  Patient presents with  . Ears stopped up    HPI Becky Boyle is a 69 year old female who presents with bilateral impacted cerumen.  Patient is reporting hearing loss in the last 2 weeks.  Patient denies ringing in the ear, headache, dizziness or fever.    Past Medical History:  Diagnosis Date  . Benign brain tumor [facility])     Past Surgical History:  Procedure Laterality Date  . ABDOMINAL HYSTERECTOMY    . BRAIN SURGERY  1989   tumor   . CORONARY STENT INTERVENTION N/A 02/17/2020   Procedure: CORONARY STENT INTERVENTION;  Surgeon: Jettie Booze, MD;  Location: [place] [facility];  Service: Cardiovascular;  Laterality: N/A;  . RIGHT/LEFT HEART CATH AND CORONARY ANGIOGRAPHY N/A 02/17/2020   Procedure: RIGHT/LEFT HEART CATH AND CORONARY ANGIOGRAPHY;  Surgeon: Jettie Booze, MD;  Location: [place] [facility];  Service: Cardiovascular;  Laterality: N/A;    Family History  Problem Relation Age of Onset  . Alzheimer's disease Mother   . Cancer Father   . Diabetes Son   . High blood pressure Son     Social History   Socioeconomic History  . Marital status: Married    Spouse name: Not on file  . Number of children: Not on file  . Years of education: Not on file  . Highest education level: Not on file  Occupational History  . Not on file  Tobacco Use  . Smoking status: Never Smoker  . Smokeless tobacco: Never Used  Substance and Sexual Activity  . Alcohol use: No  . Drug use: No  . Sexual activity: Not on file  Other Topics Concern  . Not on file  Social History Narrative  . Not on file   Social Determinants of Health   Financial Resource Strain:   . Difficulty of Paying Living Expenses: Not on file  Food Insecurity:   . Worried About Charity fundraiser in the Last Year: Not on file  . Ran Out of  Food in the Last Year: Not on file  Transportation Needs:   . Lack of Transportation (Medical): Not on file  . Lack of Transportation (Non-Medical): Not on file  Physical Activity:   . Days of Exercise per Week: Not on file  . Minutes of Exercise per Session: Not on file  Stress:   . Feeling of Stress : Not on file  Social Connections:   . Frequency of Communication with Friends and Family: Not on file  . Frequency of Social Gatherings with Friends and Family: Not on file  . Attends Religious Services: Not on file  . Active Member of Clubs or Organizations: Not on file  . Attends Archivist Meetings: Not on file  . Marital Status: Not on file  Intimate Partner Violence:   . Fear of Current or Ex-Partner: Not on file  . Emotionally Abused: Not on file  . Physically Abused: Not on file  . Sexually Abused: Not on file    Outpatient Medications Prior to Visit  Medication Sig Dispense Refill  . aspirin EC 81 MG tablet Take 1 tablet (81 mg total) by mouth daily. Swallow whole. 150 tablet 2  . atorvastatin (LIPITOR) 80 MG tablet Take 1 tablet (80 mg total) by mouth at bedtime. 30 tablet 5  .  bacitracin 500 UNIT/GM ointment Apply 1 application topically 2 (two) times daily. 15 g 0  . Blood Glucose Monitoring Suppl ([place]) w/Device KIT     . carvedilol (COREG) 6.25 MG tablet Take 1 tablet (6.25 mg total) by mouth 2 (two) times daily with a meal. 60 tablet 5  . dapagliflozin propanediol (FARXIGA) 10 MG TABS tablet Take 1 tablet (10 mg total) by mouth daily before breakfast. 30 tablet 6  . furosemide (LASIX) 20 MG tablet Take 1 tablet (20 mg total) by mouth daily. 90 tablet 3  . Lancets (ONETOUCH DELICA PLUS QIONGE95M) MISC Apply 1 each topically daily. 1 each 2  . metFORMIN (GLUCOPHAGE) 500 MG tablet Take 1 tablet (500 mg total) by mouth daily with breakfast. 30 tablet 1  . nitroGLYCERIN (NITROSTAT) 0.4 MG SL tablet Place 1 tablet (0.4 mg total) under the tongue  every 5 (five) minutes x 3 doses as needed for chest pain. 25 tablet 2  . ONETOUCH VERIO test strip CHECK BLOOD SUGAR ONCE DAILY OR AS DIRECTED 50 strip 0  . sacubitril-valsartan (ENTRESTO) 49-51 MG Take 1 tablet by mouth 2 (two) times daily. 60 tablet 5  . spironolactone (ALDACTONE) 25 MG tablet Take 1 tablet (25 mg total) by mouth daily. 30 tablet 5  . ticagrelor (BRILINTA) 90 MG TABS tablet Take 1 tablet (90 mg total) by mouth 2 (two) times daily. 60 tablet 11   No facility-administered medications prior to visit.    Allergies  Allergen Reactions  . Latex Anaphylaxis  . Penicillins Hives  . Codeine Rash  . Tape Rash    ROS Review of Systems  Constitutional: Negative.   HENT: Positive for ear pain and hearing loss. Negative for sinus pressure, sinus pain, sore throat and tinnitus.   Eyes: Negative.   Respiratory: Negative.   Skin: Negative.   Neurological: Negative for headaches.      Objective:    Physical Exam Constitutional:      Appearance: Normal appearance.  HENT:     Head: Normocephalic.     Nose: Nose normal.  Eyes:     Conjunctiva/sclera: Conjunctivae normal.  Cardiovascular:     Rate and Rhythm: Normal rate and regular rhythm.     Pulses: Normal pulses.     Heart sounds: Normal heart sounds.  Pulmonary:     Effort: Pulmonary effort is normal.     Breath sounds: Normal breath sounds.  Abdominal:     General: Bowel sounds are normal.  Skin:    General: Skin is warm.  Neurological:     Mental Status: She is alert and oriented to person, place, and time.     BP 123/68   Pulse 65   Temp (!) 97.5 F (36.4 C) (Temporal)   Ht 5' 4" (1.626 m)   Wt 157 lb 6.4 oz (71.4 kg)   SpO2 98%   BMI 27.02 kg/m  Wt Readings from Last 3 Encounters:  03/30/20 157 lb 6.4 oz (71.4 kg)  03/22/20 156 lb 3.2 oz (70.9 kg)  03/08/20 155 lb 12.8 oz (70.7 kg)     Health Maintenance Due  Topic Date Due  . Hepatitis C Screening  Never done  . FOOT EXAM  Never done    . OPHTHALMOLOGY EXAM  Never done  . TETANUS/TDAP  Never done  . MAMMOGRAM  Never done  . COLONOSCOPY  Never done  . DEXA SCAN  Never done  . PNA vac Low Risk Adult (1 of 2 -  PCV13) Never done  . INFLUENZA VACCINE  02/29/2020      Lab Results  Component Value Date   TSH 7.480 (H) 03/22/2020   Lab Results  Component Value Date   WBC 10.6 (H) 02/24/2020   HGB 13.8 02/24/2020   HCT 43.7 02/24/2020   MCV 91.8 02/24/2020   PLT 329 02/24/2020   Lab Results  Component Value Date   NA 139 03/15/2020   K 4.4 03/15/2020   CO2 24 03/15/2020   GLUCOSE 183 (H) 03/15/2020   BUN 27 (H) 03/15/2020   CREATININE 1.15 (H) 03/15/2020   BILITOT 1.1 02/17/2020   ALKPHOS 84 02/17/2020   AST 52 (H) 02/17/2020   ALT 54 (H) 02/17/2020   PROT 7.2 02/17/2020   ALBUMIN 3.5 02/17/2020   CALCIUM 9.7 03/15/2020   ANIONGAP 11 03/15/2020   Lab Results  Component Value Date   CHOL 96 (L) 03/22/2020   Lab Results  Component Value Date   HDL 27 (L) 03/22/2020   Lab Results  Component Value Date   LDLCALC 37 03/22/2020   Lab Results  Component Value Date   TRIG 197 (H) 03/22/2020   Lab Results  Component Value Date   CHOLHDL 3.6 03/22/2020   Lab Results  Component Value Date   HGBA1C 8.2 (H) 03/22/2020      Assessment & Plan:  Bilateral impacted cerumen Patient is a 69 year old female who presents to clinic with bilateral impacted cerumen.  Patient is reporting hearing loss in the last 2 weeks.  Patient denies ringing in the ear, headache, dizziness.  On assessment bilateral ear impacted.  Patient reports slight pain in her left ear.  Ear disimpacted by flushing.  Ear canal is patent.  Provided education to patient to continue to use Debrox 2-3 times weekly. Follow-up as needed with unresolved or worsening symptoms.  Problem List Items Addressed This Visit      Nervous and Auditory   Bilateral impacted cerumen - Primary      Follow-up: Return if symptoms worsen or fail to  improve.    Ivy Lynn, NP

## 2020-03-30 NOTE — Assessment & Plan Note (Signed)
Patient is a 69 year old female who presents to clinic with bilateral impacted cerumen.  Patient is reporting hearing loss in the last 2 weeks.  Patient denies ringing in the ear, headache, dizziness.  On assessment bilateral ear impacted.  Patient reports slight pain in her left ear.  Ear disimpacted by flushing.  Ear canal is patent.  Provided education to patient to continue to use Debrox 2-3 times weekly. Follow-up as needed with unresolved or worsening symptoms.

## 2020-03-30 NOTE — Patient Instructions (Signed)
Earwax Buildup, Adult The ears produce a substance called earwax that helps keep bacteria out of the ear and protects the skin in the ear canal. Occasionally, earwax can build up in the ear and cause discomfort or hearing loss. What increases the risk? This condition is more likely to develop in people who:  Are female.  Are elderly.  Naturally produce more earwax.  Clean their ears often with cotton swabs.  Use earplugs often.  Use in-ear headphones often.  Wear hearing aids.  Have narrow ear canals.  Have earwax that is overly thick or sticky.  Have eczema.  Are dehydrated.  Have excess hair in the ear canal. What are the signs or symptoms? Symptoms of this condition include:  Reduced or muffled hearing.  A feeling of fullness in the ear or feeling that the ear is plugged.  Fluid coming from the ear.  Ear pain.  Ear itch.  Ringing in the ear.  Coughing.  An obvious piece of earwax that can be seen inside the ear canal. How is this diagnosed? This condition may be diagnosed based on:  Your symptoms.  Your medical history.  An ear exam. During the exam, your health care provider will look into your ear with an instrument called an otoscope. You may have tests, including a hearing test. How is this treated? This condition may be treated by:  Using ear drops to soften the earwax.  Having the earwax removed by a health care provider. The health care provider may: ? Flush the ear with water. ? Use an instrument that has a loop on the end (curette). ? Use a suction device.  Surgery to remove the wax buildup. This may be done in severe cases. Follow these instructions at home:   Take over-the-counter and prescription medicines only as told by your health care provider.  Do not put any objects, including cotton swabs, into your ear. You can clean the opening of your ear canal with a washcloth or facial tissue.  Follow instructions from your health care  provider about cleaning your ears. Do not over-clean your ears.  Drink enough fluid to keep your urine clear or pale yellow. This will help to thin the earwax.  Keep all follow-up visits as told by your health care provider. If earwax builds up in your ears often or if you use hearing aids, consider seeing your health care provider for routine, preventive ear cleanings. Ask your health care provider how often you should schedule your cleanings.  If you have hearing aids, clean them according to instructions from the manufacturer and your health care provider. Contact a health care provider if:  You have ear pain.  You develop a fever.  You have blood, pus, or other fluid coming from your ear.  You have hearing loss.  You have ringing in your ears that does not go away.  Your symptoms do not improve with treatment.  You feel like the room is spinning (vertigo). Summary  Earwax can build up in the ear and cause discomfort or hearing loss.  The most common symptoms of this condition include reduced or muffled hearing and a feeling of fullness in the ear or feeling that the ear is plugged.  This condition may be diagnosed based on your symptoms, your medical history, and an ear exam.  This condition may be treated by using ear drops to soften the earwax or by having the earwax removed by a health care provider.  Do not put any   objects, including cotton swabs, into your ear. You can clean the opening of your ear canal with a washcloth or facial tissue. This information is not intended to replace advice given to you by your health care provider. Make sure you discuss any questions you have with your health care provider. Document Revised: 06/29/2017 Document Reviewed: 09/27/2016 Elsevier Patient Education  2020 Elsevier Inc[employer]  

## 2020-03-31 NOTE — Telephone Encounter (Signed)
Advanced Heart Failure Patient Advocate Encounter   Patient was approved to receive Farxiga from [employer].  Patient ID: [id number] Effective dates: 03/26/20 through 07/30/20  Called [employer] to check that patient was approved for both medications that were requested. The representative stated that the patient was approved for Wilder Glade but there was no record of Brilinta being requested. The applications were sent in at the same time, with a successful confirmation. I went ahead and sent the Brilinta application in again, via fax.  Called and left the patient a message.  Will follow up.

## 2020-04-07 ENCOUNTER — Ambulatory Visit (HOSPITAL_COMMUNITY)
Admission: RE | Admit: 2020-04-07 | Discharge: 2020-04-07 | Disposition: A | Discharge status: Home / Self Care | Payer: Medicare HMO | Source: Ambulatory Visit | Attending: Cardiology | Admitting: Cardiology

## 2020-04-07 ENCOUNTER — Other Ambulatory Visit: Payer: Self-pay

## 2020-04-07 ENCOUNTER — Encounter (HOSPITAL_COMMUNITY): Payer: Self-pay

## 2020-04-07 VITALS — BP 124/64 | HR 88 | Wt 156.8 lb

## 2020-04-07 DIAGNOSIS — I252 Old myocardial infarction: Secondary | ICD-10-CM | POA: Diagnosis not present

## 2020-04-07 DIAGNOSIS — Z79899 Other long term (current) drug therapy: Secondary | ICD-10-CM | POA: Insufficient documentation

## 2020-04-07 DIAGNOSIS — I255 Ischemic cardiomyopathy: Secondary | ICD-10-CM | POA: Diagnosis not present

## 2020-04-07 DIAGNOSIS — Z955 Presence of coronary angioplasty implant and graft: Secondary | ICD-10-CM | POA: Insufficient documentation

## 2020-04-07 DIAGNOSIS — E119 Type 2 diabetes mellitus without complications: Secondary | ICD-10-CM | POA: Diagnosis not present

## 2020-04-07 DIAGNOSIS — Z7984 Long term (current) use of oral hypoglycemic drugs: Secondary | ICD-10-CM | POA: Diagnosis not present

## 2020-04-07 DIAGNOSIS — I5022 Chronic systolic (congestive) heart failure: Secondary | ICD-10-CM | POA: Diagnosis present

## 2020-04-07 DIAGNOSIS — Z7982 Long term (current) use of aspirin: Secondary | ICD-10-CM | POA: Diagnosis not present

## 2020-04-07 DIAGNOSIS — I2510 Atherosclerotic heart disease of native coronary artery without angina pectoris: Secondary | ICD-10-CM | POA: Insufficient documentation

## 2020-04-07 LAB — BASIC METABOLIC PANEL
Anion gap: 14 (ref 5–15)
BUN: 26 mg/dL — ABNORMAL HIGH (ref 8–23)
CO2: 23 mmol/L (ref 22–32)
Calcium: 9.7 mg/dL (ref 8.9–10.3)
Chloride: 101 mmol/L (ref 98–111)
Creatinine, Ser: 1.2 mg/dL — ABNORMAL HIGH (ref 0.44–1.00)
GFR calc Af Amer: 54 mL/min — ABNORMAL LOW (ref >60)
GFR calc non Af Amer: 46 mL/min — ABNORMAL LOW (ref >60)
Glucose, Bld: 146 mg/dL — ABNORMAL HIGH (ref 70–99)
Potassium: 4.1 mmol/L (ref 3.5–5.1)
Sodium: 138 mmol/L (ref 135–145)

## 2020-04-07 LAB — BRAIN NATRIURETIC PEPTIDE: B Natriuretic Peptide: 58.9 pg/mL (ref 0.0–100.0)

## 2020-04-07 MED ORDER — CARVEDILOL 12.5 MG PO TABS: 12.5000 mg | ORAL_TABLET | Freq: Two times a day (BID) | ORAL | 3 refills | Status: DC

## 2020-04-07 NOTE — Telephone Encounter (Signed)
Patient was approved to receive Brilinta from [employer]. They use the same patient ID and approval dates. She is approved through the end of this year. The medication was shipped out on 09/06. She should receive the medication in 7-10 business days.   Patient is aware.  Charlann Boxer, CPhT

## 2020-04-07 NOTE — Patient Instructions (Signed)
It was a pleasure seeing you today!  MEDICATIONS: -We are changing your medications today -Increase carvedilol to 12.5 mg (1 tablet) twice daily. You may take 2 tablets of the 6.25 mg strength twice daily until you pick up the new strength.  -Call if you have questions about your medications.  LABS: -We will call you if your labs need attention.  NEXT APPOINTMENT: Return to clinic in 5 weeks with Pharmacy [facility].  In general, to take care of your heart failure: -Limit your fluid intake to 2 Liters (half-gallon) per day.   -Limit your salt intake to ideally 2-3 grams (2000-3000 mg) per day. -Weigh yourself daily and record, and bring that "weight diary" to your next appointment.  (Weight gain of 2-3 pounds in 1 day typically means fluid weight.) -The medications for your heart are to help your heart and help you live longer.   -Please contact us before stopping any of your heart medications.  Call the clinic at [phone] with questions or to reschedule future appointments.

## 2020-04-08 ENCOUNTER — Telehealth (HOSPITAL_COMMUNITY): Payer: Self-pay | Admitting: Pharmacist

## 2020-04-08 NOTE — Telephone Encounter (Signed)
Sent in Manufacturer's Assistance application to [employer] for Entresto.    Application pending, will continue to follow.  Tanza Pellot, PharmD, BCPS, BCCP, CPP Heart Failure Clinic Pharmacist [phone]  

## 2020-04-12 ENCOUNTER — Encounter (HOSPITAL_COMMUNITY): Payer: Medicare HMO

## 2020-04-13 ENCOUNTER — Ambulatory Visit (INDEPENDENT_AMBULATORY_CARE_PROVIDER_SITE_OTHER): Payer: Medicare HMO | Admitting: *Deleted

## 2020-04-13 ENCOUNTER — Other Ambulatory Visit: Payer: Self-pay

## 2020-04-13 DIAGNOSIS — Z23 Encounter for immunization: Secondary | ICD-10-CM | POA: Diagnosis not present

## 2020-04-13 NOTE — Progress Notes (Signed)
Patient in today for Shingles and Pneumonia vaccines. Patient tolerated well.

## 2020-04-14 NOTE — Telephone Encounter (Signed)
Advanced Heart Failure Patient Advocate Encounter   Patient was approved to receive Entresto from [employer]  Patient ID: [id number] Effective dates: 04/13/20 through 07/30/20  Called and left patient a message regarding approval.  Charlann Boxer, CPhT

## 2020-04-16 ENCOUNTER — Telehealth (HOSPITAL_COMMUNITY): Payer: Self-pay | Admitting: Licensed Clinical Social Worker

## 2020-04-16 NOTE — Telephone Encounter (Signed)
CSW consulted by patient advocate to assist with pt cost concerns for her lifevest- was informed before getting it that it should be $0 through her insurance but has now received a bill for over $500 which she cannot afford.  When CSW called pt she had already spoken to [employer] representative who is sending her application for assistance program- pt will inform us of the results of this  Pt reports she has no other concerns at this time and greatly appreciates all the help the patient advocate provided with her medication assistance.  Will continue to follow and assist as needed  Jorge Ny, [facility] Desk#: [phone] Cell#: [phone]

## 2020-04-22 DIAGNOSIS — I214 Non-ST elevation (NSTEMI) myocardial infarction: Secondary | ICD-10-CM | POA: Diagnosis not present

## 2020-05-05 ENCOUNTER — Encounter (HOSPITAL_COMMUNITY): Payer: Medicare HMO | Admitting: Cardiology

## 2020-05-11 ENCOUNTER — Other Ambulatory Visit (HOSPITAL_COMMUNITY): Payer: Medicare HMO

## 2020-05-11 ENCOUNTER — Encounter (HOSPITAL_COMMUNITY): Payer: Medicare HMO | Admitting: Cardiology

## 2020-05-13 ENCOUNTER — Ambulatory Visit (HOSPITAL_BASED_OUTPATIENT_CLINIC_OR_DEPARTMENT_OTHER)
Admission: RE | Admit: 2020-05-13 | Discharge: 2020-05-13 | Disposition: A | Discharge status: Home / Self Care | Payer: Medicare HMO | Source: Ambulatory Visit | Attending: Cardiology | Admitting: Cardiology

## 2020-05-13 ENCOUNTER — Other Ambulatory Visit: Payer: Self-pay

## 2020-05-13 ENCOUNTER — Ambulatory Visit (HOSPITAL_COMMUNITY)
Admission: RE | Admit: 2020-05-13 | Discharge: 2020-05-13 | Disposition: A | Discharge status: Home / Self Care | Payer: Medicare HMO | Source: Ambulatory Visit | Attending: Cardiology | Admitting: Cardiology

## 2020-05-13 VITALS — BP 117/77 | HR 76 | Wt 157.8 lb

## 2020-05-13 DIAGNOSIS — Z7984 Long term (current) use of oral hypoglycemic drugs: Secondary | ICD-10-CM | POA: Diagnosis not present

## 2020-05-13 DIAGNOSIS — I252 Old myocardial infarction: Secondary | ICD-10-CM | POA: Diagnosis not present

## 2020-05-13 DIAGNOSIS — I5043 Acute on chronic combined systolic (congestive) and diastolic (congestive) heart failure: Secondary | ICD-10-CM | POA: Diagnosis not present

## 2020-05-13 DIAGNOSIS — Z955 Presence of coronary angioplasty implant and graft: Secondary | ICD-10-CM | POA: Diagnosis not present

## 2020-05-13 DIAGNOSIS — E785 Hyperlipidemia, unspecified: Secondary | ICD-10-CM | POA: Diagnosis not present

## 2020-05-13 DIAGNOSIS — I2510 Atherosclerotic heart disease of native coronary artery without angina pectoris: Secondary | ICD-10-CM | POA: Insufficient documentation

## 2020-05-13 DIAGNOSIS — I5022 Chronic systolic (congestive) heart failure: Secondary | ICD-10-CM

## 2020-05-13 DIAGNOSIS — Z7901 Long term (current) use of anticoagulants: Secondary | ICD-10-CM | POA: Diagnosis not present

## 2020-05-13 DIAGNOSIS — E119 Type 2 diabetes mellitus without complications: Secondary | ICD-10-CM | POA: Insufficient documentation

## 2020-05-13 DIAGNOSIS — Z79899 Other long term (current) drug therapy: Secondary | ICD-10-CM | POA: Diagnosis not present

## 2020-05-13 DIAGNOSIS — Z7902 Long term (current) use of antithrombotics/antiplatelets: Secondary | ICD-10-CM | POA: Diagnosis not present

## 2020-05-13 DIAGNOSIS — I5042 Chronic combined systolic (congestive) and diastolic (congestive) heart failure: Secondary | ICD-10-CM | POA: Diagnosis not present

## 2020-05-13 DIAGNOSIS — I255 Ischemic cardiomyopathy: Secondary | ICD-10-CM | POA: Insufficient documentation

## 2020-05-13 DIAGNOSIS — Z7982 Long term (current) use of aspirin: Secondary | ICD-10-CM | POA: Insufficient documentation

## 2020-05-13 LAB — BASIC METABOLIC PANEL
Anion gap: 14 (ref 5–15)
BUN: 23 mg/dL (ref 8–23)
CO2: 25 mmol/L (ref 22–32)
Calcium: 9.6 mg/dL (ref 8.9–10.3)
Chloride: 101 mmol/L (ref 98–111)
Creatinine, Ser: 1.15 mg/dL — ABNORMAL HIGH (ref 0.44–1.00)
GFR, Estimated: 49 mL/min — ABNORMAL LOW (ref >60)
Glucose, Bld: 151 mg/dL — ABNORMAL HIGH (ref 70–99)
Potassium: 3.7 mmol/L (ref 3.5–5.1)
Sodium: 140 mmol/L (ref 135–145)

## 2020-05-13 LAB — ECHOCARDIOGRAM COMPLETE
Area-P 1/2: 3.42 cm2
P 1/2 time: 505 msec
S' Lateral: 2.95 cm

## 2020-05-13 NOTE — Progress Notes (Signed)
PCP: [name], FNP Cardiology: Dr. Aundra Dubin  69 y.o. with history of NSTEMI and ischemic cardiomyopathy presents for followup of CHF and CAD.  Patient was admitted in 7/21 with NSTEMI.  Cath showed LAD culprit, patient had DES x 2 to proximal LAD.  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 Echo was done showing EF 25-30%, septal akinesis. Patient was started on cardiac meds and was discharged home with a Lifevest. A new diagnosis of diabetes was made and she was started on metformin.   Echo was done today, EF up to 55-60% with normal RV.   She returns for followup of CHF and CAD.  She is still wearing her Lifevest.  No chest pain.  No dyspnea walking on flat ground. Unable to do cardiac rehab (not covered by her insurance).  She can climb the stairs into her house without problems.  She has not been taking Lasix.  No orthopnea/PND.    Labs (7/21): K 4.1, creatinine 0.87 => 0.98 Labs (9/21): BNP 59, K 4.1, creatinine 1.2, LDL 37, TGs 197, HDL 27  PMH: 1. Type 2 diabetes: New diagnosis in 7/21.  2. Hyperlipidemia 3. Chronic systolic CHF: Ischemic cardiomyopathy.   - Echo (7/21): EF 25-30%, septal akinesis.   - Echo (10/21): EF 55-60%, normal RV, mild MR.  4. CAD: NSTEMI in 7/21.  - LHC: DES x 2 to proximal LAD, CTO RCA with collaterals, moderate PLOM stenosis.   Social History   Socioeconomic History  . Marital status: Married    Spouse name: Not on file  . Number of children: Not on file  . Years of education: Not on file  . Highest education level: Not on file  Occupational History  . Not on file  Tobacco Use  . Smoking status: Never Smoker  . Smokeless tobacco: Never Used  Substance and Sexual Activity  . Alcohol use: No  . Drug use: No  . Sexual activity: Not on file  Other Topics Concern  . Not on file  Social History Narrative  . Not on file   Social Determinants of Health   Financial Resource Strain:   . Difficulty  of Paying Living Expenses: Not on file  Food Insecurity:   . Worried About Charity fundraiser in the Last Year: Not on file  . Ran Out of Food in the Last Year: Not on file  Transportation Needs:   . Lack of Transportation (Medical): Not on file  . Lack of Transportation (Non-Medical): Not on file  Physical Activity:   . Days of Exercise per Week: Not on file  . Minutes of Exercise per Session: Not on file  Stress:   . Feeling of Stress : Not on file  Social Connections:   . Frequency of Communication with Friends and Family: Not on file  . Frequency of Social Gatherings with Friends and Family: Not on file  . Attends Religious Services: Not on file  . Active Member of Clubs or Organizations: Not on file  . Attends Archivist Meetings: Not on file  . Marital Status: Not on file  Intimate Partner Violence:   . Fear of Current or Ex-Partner: Not on file  . Emotionally Abused: Not on file  . Physically Abused: Not on file  . Sexually Abused: Not on file   Family History  Problem Relation Age of Onset  . Alzheimer's disease Mother   . Cancer Father   . Diabetes Son   .  High blood pressure Son    ROS: All systems reviewed and negative except as noted in HPI.   Current Outpatient Medications  Medication Sig Dispense Refill  . aspirin EC 81 MG tablet Take 1 tablet (81 mg total) by mouth daily. Swallow whole. 150 tablet 2  . atorvastatin (LIPITOR) 80 MG tablet Take 1 tablet (80 mg total) by mouth at bedtime. 30 tablet 5  . Blood Glucose Monitoring Suppl (ONETOUCH VERIO FLEX SYSTEM) w/Device KIT     . carvedilol (COREG) 12.5 MG tablet Take 1 tablet (12.5 mg total) by mouth 2 (two) times daily with a meal. 180 tablet 3  . dapagliflozin propanediol (FARXIGA) 10 MG TABS tablet Take 1 tablet (10 mg total) by mouth daily before breakfast. 30 tablet 6  . furosemide (LASIX) 20 MG tablet Take 20 mg by mouth daily as needed.    . Lancets (ONETOUCH DELICA PLUS [id number]) MISC Apply  1 each topically daily. 1 each 2  . metFORMIN (GLUCOPHAGE) 500 MG tablet Take 1 tablet (500 mg total) by mouth daily with breakfast. 30 tablet 1  . nitroGLYCERIN (NITROSTAT) 0.4 MG SL tablet Place 1 tablet (0.4 mg total) under the tongue every 5 (five) minutes x 3 doses as needed for chest pain. 25 tablet 2  . ONETOUCH VERIO test strip CHECK BLOOD SUGAR ONCE DAILY OR AS DIRECTED 50 strip 0  . sacubitril-valsartan (ENTRESTO) 49-51 MG Take 1 tablet by mouth 2 (two) times daily. 60 tablet 5  . sodium chloride (OCEAN) 0.65 % SOLN nasal spray Place 1 spray into both nostrils as needed for congestion. 30 mL 3  . spironolactone (ALDACTONE) 25 MG tablet Take 1 tablet (25 mg total) by mouth daily. 30 tablet 5  . ticagrelor (BRILINTA) 90 MG TABS tablet Take 1 tablet (90 mg total) by mouth 2 (two) times daily. 60 tablet 11   No current facility-administered medications for this encounter.   BP 117/77   Pulse 76   Wt 71.6 kg (157 lb 12.8 oz)   SpO2 97%   BMI 27.09 kg/m  General: NAD Neck: No JVD, no thyromegaly or thyroid nodule.  Lungs: Clear to auscultation bilaterally with normal respiratory effort. CV: Nondisplaced PMI.  Heart regular S1/S2, no S3/S4, no murmur.  No peripheral edema.  No carotid bruit.  Normal pedal pulses.  Abdomen: Soft, nontender, no hepatosplenomegaly, no distention.  Skin: Intact without lesions or rashes.  Neurologic: Alert and oriented x 3.  Psych: Normal affect. Extremities: No clubbing or cyanosis.  HEENT: Normal.   Assessment/Plan: 1. CAD: NSTEMI in 7/21 with DES x 2 to proximal LAD.  There was a chronic total occlusion of the RCA and moderate stenosis in the PLOM.  No further chest pain.  - ASA 81 mg daily + Brilinta 90 mg bid.  - Atorvastatin 80 mg daily, good LDL in 9/21.  2. Chronic systolic CHF: Ischemic cardiomyopathy.  Echo in 7/21 with EF 25-30%, akinetic septum.  Echo in 10/21 with EF up to 55-60%.  She is not volume overloaded on exam, NYHA class I-II.  -  Continue Entresto 49/51 bid.  - Continue Coreg 12.5 mg bid.  - Continue spironolactone 25 mg daily.  - Continue Farxiga 10 mg daily.  - She can remove the Lifevest and does not need ICD.  3. Type 2 diabetes: New diagnosis, now on metformin and [place].     Followup 6 months.   Loralie Champagne 05/13/2020

## 2020-05-13 NOTE — Progress Notes (Signed)
  Echocardiogram 2D Echocardiogram has been performed.  Matilde Bash 05/13/2020, 2:30 PM

## 2020-05-13 NOTE — Patient Instructions (Signed)
Labs done today. We will contact you only if your labs are abnormal.  You can discontinue the use of your LifeVest.  No medication changes were made. Please continue all current medications as  prescribed.  Your physician recommends that you schedule a follow-up appointment in: 6 months. Our office will contact you at a later time schedule an appointment.  If you have any questions or concerns before your next appointment please send [place] a message through [place] or call our office at [phone].    TO LEAVE A MESSAGE FOR THE NURSE SELECT OPTION 2, PLEASE LEAVE A MESSAGE INCLUDING: . YOUR NAME . DATE OF BIRTH . CALL BACK NUMBER . REASON FOR CALL**this is important as we prioritize the call backs  [place] AS LONG AS YOU CALL BEFORE 4:00 PM   At the [facility], you and your health needs are our priority. As part of our continuing mission to provide you with exceptional heart care, we have created designated Provider Care Teams. These Care Teams include your primary Cardiologist (physician) and Advanced Practice Providers (APPs- Physician Assistants and Nurse Practitioners) who all work together to provide you with the care you need, when you need it.   You may see any of the following providers on your designated Care Team at your next follow up: Marland Kitchen Dr Glori Bickers . Dr Loralie Champagne . Darrick Grinder, NP . Lyda Jester, PA . Audry Riles, PharmD   Please be sure to bring in all your medications bottles to every appointment.

## 2020-06-01 ENCOUNTER — Telehealth: Payer: Self-pay

## 2020-06-01 ENCOUNTER — Other Ambulatory Visit: Payer: Self-pay | Admitting: *Deleted

## 2020-06-01 MED ORDER — METFORMIN HCL 500 MG PO TABS: 500.0000 mg | ORAL_TABLET | Freq: Every day | ORAL | 3 refills | Status: DC

## 2020-06-01 NOTE — Telephone Encounter (Signed)
Refills sent as requested

## 2020-06-01 NOTE — Telephone Encounter (Signed)
[facility] called stating that they have tried calling us a few times already to get pt's PCP to send in refills on pt's Metformin Rx for 500 mg that Dr Aundra Dubin previously prescribed to pt. Says they have not heard back from anyone here.  Pt had her last visit with Je on 03/22/20. Please advise.

## 2020-06-10 ENCOUNTER — Other Ambulatory Visit: Payer: Self-pay

## 2020-06-10 ENCOUNTER — Ambulatory Visit (INDEPENDENT_AMBULATORY_CARE_PROVIDER_SITE_OTHER): Payer: Medicare HMO | Admitting: *Deleted

## 2020-06-10 DIAGNOSIS — Z23 Encounter for immunization: Secondary | ICD-10-CM | POA: Diagnosis not present

## 2020-07-07 ENCOUNTER — Other Ambulatory Visit: Payer: Self-pay

## 2020-07-07 ENCOUNTER — Ambulatory Visit
Admission: RE | Admit: 2020-07-07 | Discharge: 2020-07-07 | Disposition: A | Discharge status: Home / Self Care | Payer: Medicare HMO | Source: Ambulatory Visit | Attending: Family Medicine | Admitting: Family Medicine

## 2020-07-07 DIAGNOSIS — Z1231 Encounter for screening mammogram for malignant neoplasm of breast: Secondary | ICD-10-CM | POA: Diagnosis not present

## 2020-07-12 ENCOUNTER — Other Ambulatory Visit: Payer: Self-pay

## 2020-07-12 ENCOUNTER — Ambulatory Visit: Payer: Medicare HMO

## 2020-07-13 ENCOUNTER — Ambulatory Visit (INDEPENDENT_AMBULATORY_CARE_PROVIDER_SITE_OTHER): Payer: Medicare HMO

## 2020-07-13 ENCOUNTER — Telehealth (HOSPITAL_COMMUNITY): Payer: Self-pay | Admitting: *Deleted

## 2020-07-13 DIAGNOSIS — Z0000 Encounter for general adult medical examination without abnormal findings: Secondary | ICD-10-CM

## 2020-07-13 NOTE — Telephone Encounter (Signed)
Pt left VM stating she was told by [facility] RN that she is due for follow up in our clinic. [facility] RN also told pt that she thinks patient is experiencing some anxiety. I called pt back to inform her that she is due for follow up in April and to contact her pcp regarding anxiety. Pt did not answer/left VM requesting return call.

## 2020-07-13 NOTE — Progress Notes (Signed)
MEDICARE ANNUAL WELLNESS VISIT  07/13/2020  Telephone Visit Disclaimer This Medicare AWV was conducted by telephone due to national recommendations for restrictions regarding the COVID-19 Pandemic (e.g. social distancing).  I verified, using two identifiers, that I am speaking with Becky Boyle or their authorized healthcare agent. I discussed the limitations, risks, security, and privacy concerns of performing an evaluation and management service by telephone and the potential availability of an in-person appointment in the future. The patient expressed understanding and agreed to proceed.  Location of Patient: Home Location of Provider (nurse):  [facility]  Subjective:    Becky Boyle is a 69 y.o. female patient of Becky Boyle, [place] who had a Medicare Annual Wellness Visit today via telephone. Becky Boyle is Retired and lives with their spouse. She has two children, nine grandchildren, and six great-grandchildren. She reports that she is socially active and does interact with friends/family regularly. She is minimally physically active and enjoys reading, cooking and baking in her spare time.  Patient Care Team: Becky Brooklyn, FNP as PCP - General (Family Medicine)  Advanced Directives 07/13/2020 03/24/2016  Does Patient Have a Medical Advance Directive? No No  Would patient like information on creating a medical advance directive? No - Patient declined No - patient declined information    Hospital Utilization Over the Past 12 Months: # of hospitalizations or ER visits: 1 # of surgeries: 1  Review of Systems    Patient reports that her overall health is worse compared to last year.  Patient had a heart attack in July and two stents were placed.  She is doing well and follows up with her cardiologist in February 2022.  History obtained from chart review and the patient  Patient Reported Readings (BP, Pulse, CBG, Weight, etc) none  Pain Assessment Pain : No/denies pain      Current Medications & Allergies (verified) Allergies as of 07/13/2020      Reactions   Latex Anaphylaxis   Penicillins Hives   Codeine Rash   Tape Rash      Medication List       Accurate as of July 13, 2020 10:00 AM. If you have any questions, ask your nurse or doctor.        aspirin EC 81 MG tablet Take 1 tablet (81 mg total) by mouth daily. Swallow whole.   atorvastatin 80 MG tablet Commonly known as: LIPITOR Take 1 tablet (80 mg total) by mouth at bedtime.   carvedilol 12.5 MG tablet Commonly known as: COREG Take 1 tablet (12.5 mg total) by mouth 2 (two) times daily with a meal.   cetirizine 10 MG tablet Commonly known as: ZYRTEC Take 10 mg by mouth as needed for allergies.   dapagliflozin propanediol 10 MG Tabs tablet Commonly known as: Farxiga Take 1 tablet (10 mg total) by mouth daily before breakfast.   furosemide 20 MG tablet Commonly known as: LASIX Take 20 mg by mouth daily as needed.   metFORMIN 500 MG tablet Commonly known as: GLUCOPHAGE Take 1 tablet (500 mg total) by mouth daily with breakfast. What changed: when to take this   nitroGLYCERIN 0.4 MG SL tablet Commonly known as: NITROSTAT Place 1 tablet (0.4 mg total) under the tongue every 5 (five) minutes x 3 doses as needed for chest pain.   OneTouch Delica Plus [id number] Misc Apply 1 each topically daily.   OneTouch Verio Flex System w/Device Kit   OneTouch Verio test strip Generic drug: glucose blood  CHECK BLOOD SUGAR ONCE DAILY OR AS DIRECTED   sacubitril-valsartan 49-51 MG Commonly known as: ENTRESTO Take 1 tablet by mouth 2 (two) times daily.   sodium chloride 0.65 % Soln nasal spray Commonly known as: OCEAN Place 1 spray into both nostrils as needed for congestion.   spironolactone 25 MG tablet Commonly known as: ALDACTONE Take 1 tablet (25 mg total) by mouth daily.   ticagrelor 90 MG Tabs tablet Commonly known as: BRILINTA Take 1 tablet (90 mg total) by mouth 2  (two) times daily.       History (reviewed): Past Medical History:  Diagnosis Date  . Benign brain tumor [facility])    Past Surgical History:  Procedure Laterality Date  . ABDOMINAL HYSTERECTOMY    . BRAIN SURGERY  1989   tumor   . CORONARY STENT INTERVENTION N/A 02/17/2020   Procedure: CORONARY STENT INTERVENTION;  Surgeon: Jettie Booze, MD;  Location: [place] [facility];  Service: Cardiovascular;  Laterality: N/A;  . RIGHT/LEFT HEART CATH AND CORONARY ANGIOGRAPHY N/A 02/17/2020   Procedure: RIGHT/LEFT HEART CATH AND CORONARY ANGIOGRAPHY;  Surgeon: Jettie Booze, MD;  Location: [place] [facility];  Service: Cardiovascular;  Laterality: N/A;   Family History  Problem Relation Age of Onset  . Alzheimer's disease Mother   . Cancer Father   . Diabetes Son   . High blood pressure Son    Social History   Socioeconomic History  . Marital status: Married    Spouse name: Not on file  . Number of children: Not on file  . Years of education: Not on file  . Highest education level: Not on file  Occupational History  . Not on file  Tobacco Use  . Smoking status: Never Smoker  . Smokeless tobacco: Never Used  Substance and Sexual Activity  . Alcohol use: No  . Drug use: No  . Sexual activity: Not on file  Other Topics Concern  . Not on file  Social History Narrative  . Not on file   Social Determinants of Health   Financial Resource Strain: Not on file  Food Insecurity: Not on file  Transportation Needs: Not on file  Physical Activity: Not on file  Stress: Not on file  Social Connections: Not on file    Activities of Daily Living In your present state of health, do you have any difficulty performing the following activities: 07/13/2020  Hearing? Y  Comment has noticed a decline in hearing  Vision? N  Difficulty concentrating or making decisions? N  Walking or climbing stairs? N  Dressing or bathing? N  Doing errands, shopping? N  Preparing Food and  eating ? N  Using the Toilet? N  In the past six months, have you accidently leaked urine? N  Do you have problems with loss of bowel control? N  Managing your Medications? N  Managing your Finances? N  Housekeeping or managing your Housekeeping? N  Some recent data might be hidden    Patient Education/ Literacy How often do you need to have someone help you when you read instructions, pamphlets, or other written materials from your doctor or pharmacy?: 1 - Never What is the last grade level you completed in school?: College degree  Exercise Current Exercise Habits: The patient does not participate in regular exercise at present, Exercise limited by: cardiac condition(s)  Diet Patient reports consuming 2 meals a day and 1 snack(s) a day Patient reports that her primary diet is: Low fat Patient reports  that she does have regular access to food.   Depression Screen PHQ 2/9 Scores 07/13/2020 03/30/2020 03/22/2020 03/22/2020 03/22/2020  PHQ - 2 Score 1 0 0 0 0  PHQ- 9 Score - - 0 - -     Fall Risk Fall Risk  07/13/2020 03/30/2020 03/22/2020 03/22/2020  Falls in the past year? 0 0 0 0  Number falls in past yr: - 0 0 0  Injury with Fall? - 0 0 0  Risk for fall due to : - - No Fall Risks No Fall Risks  Follow up Falls evaluation completed - Falls evaluation completed Falls evaluation completed     Objective:  MARTITA BRUMM seemed alert and oriented and she participated appropriately during our telephone visit.  Blood Pressure Weight BMI  BP Readings from Last 3 Encounters:  05/13/20 117/77  04/07/20 124/64  03/30/20 123/68   Wt Readings from Last 3 Encounters:  05/13/20 157 lb 12.8 oz (71.6 kg)  04/07/20 156 lb 12.8 oz (71.1 kg)  03/30/20 157 lb 6.4 oz (71.4 kg)   BMI Readings from Last 1 Encounters:  05/13/20 27.09 kg/m    *Unable to obtain current vital signs, weight, and BMI due to telephone visit type  Hearing/Vision  . Taaliyah did not seem to have difficulty with  hearing/understanding during the telephone conversation . Reports that she has not had a formal eye exam by an eye care professional within the past year . Reports that she has not had a formal hearing evaluation within the past year *Unable to fully assess hearing and vision during telephone visit type  Cognitive Function: 6CIT Screen 07/13/2020  What Year? 0 points  What month? 0 points  What time? 0 points  Count back from 20 0 points  Months in reverse 0 points  Repeat phrase 0 points  Total Score 0   (Normal:0-7, Significant for Dysfunction: >8)  Normal Cognitive Function Screening: Yes   Immunization & Health Maintenance Record Immunization History  Administered Date(s) Administered  . Fluad Quad(high Dose 65+) 06/10/2020  . Moderna Sars-Covid-2 Vaccination 11/04/2019, 12/02/2019, 06/15/2020  . Pneumococcal Conjugate-13 04/13/2020  . Zoster Recombinat (Shingrix) 04/13/2020    Health Maintenance  Topic Date Due  . Hepatitis C Screening  Never done  . FOOT EXAM  Never done  . OPHTHALMOLOGY EXAM  Never done  . TETANUS/TDAP  Never done  . MAMMOGRAM  Never done  . COLONOSCOPY  Never done  . DEXA SCAN  Never done  . HEMOGLOBIN A1C  09/22/2020  . URINE MICROALBUMIN  03/22/2021  . PNA vac Low Risk Adult (2 of 2 - PPSV23) 04/13/2021  . INFLUENZA VACCINE  Completed  . COVID-19 Vaccine  Completed       Assessment  This is a routine wellness examination for Becky Boyle.  Health Maintenance: Due or Overdue Health Maintenance Due  Topic Date Due  . Hepatitis C Screening  Never done  . FOOT EXAM  Never done  . OPHTHALMOLOGY EXAM  Never done  . TETANUS/TDAP  Never done  . MAMMOGRAM  Never done  . COLONOSCOPY  Never done  . DEXA SCAN  Never done    Becky Boyle does not need a referral for Community Assistance: Care Management:   no Social Work:    no Prescription Assistance:  no Nutrition/Diabetes Education:  no   Plan:  Personalized Goals Goals Addressed             This Visit's Progress   .  Patient Stated       07/13/2020 AWV Goal: Exercise for General Health   Patient will verbalize understanding of the benefits of increased physical activity:  Exercising regularly is important. It will improve your overall fitness, flexibility, and endurance.  Regular exercise also will improve your overall health. It can help you control your weight, reduce stress, and improve your bone density.  Over the next year, patient will increase physical activity as tolerated with a goal of at least 150 minutes of moderate physical activity per week.   You can tell that you are exercising at a moderate intensity if your heart starts beating faster and you start breathing faster but can still hold a conversation.  Moderate-intensity exercise ideas include:  Walking 1 mile (1.6 km) in about 15 minutes  Biking  Hiking  Golfing  Dancing  Water aerobics  Patient will verbalize understanding of everyday activities that increase physical activity by providing examples like the following: ? Yard work, such as: ? Pushing a Conservation officer, nature ? Raking and bagging leaves ? Washing your car ? Pushing a stroller ? Shoveling snow ? Gardening ? Washing windows or floors  Patient will be able to explain general safety guidelines for exercising:   Before you start a new exercise program, talk with your health care provider.  Do not exercise so much that you hurt yourself, feel dizzy, or get very short of breath.  Wear comfortable clothes and wear shoes with good support.  Drink plenty of water while you exercise to prevent dehydration or heat stroke.  Work out until your breathing and your heartbeat get faster.       Personalized Health Maintenance & Screening Recommendations  Pneumococcal vaccine  Td vaccine Bone densitometry screening Colorectal cancer screening  Lung Cancer Screening Recommended: no (Low Dose CT Chest recommended if Age 26-80  years, 30 pack-year currently smoking OR have quit w/in past 15 years) Hepatitis C Screening recommended: yes HIV Screening recommended: no  Advanced Directives: Written information was not prepared per patient's request.  Referrals & Orders No orders of the defined types were placed in this encounter.   Follow-up Plan . Follow-up with Becky Brooklyn, FNP as planned . Schedule bone density screening . Schedule colonoscopy or obtain Cologuard   I have personally reviewed and noted the following in the patient's chart:   . Medical and social history . Use of alcohol, tobacco or illicit drugs  . Current medications and supplements . Functional ability and status . Nutritional status . Physical activity . Advanced directives . List of other physicians . Hospitalizations, surgeries, and ER visits in previous 12 months . Vitals . Screenings to include cognitive, depression, and falls . Referrals and appointments  In addition, I have reviewed and discussed with Becky Boyle certain preventive protocols, quality metrics, and best practice recommendations. A written personalized care plan for preventive services as well as general preventive health recommendations is available and can be mailed to the patient at her request.      Felicity Coyer, LPN    [id number]

## 2020-07-20 ENCOUNTER — Other Ambulatory Visit: Payer: Self-pay | Admitting: Family Medicine

## 2020-07-20 ENCOUNTER — Other Ambulatory Visit: Payer: Self-pay | Admitting: Ophthalmology

## 2020-07-20 DIAGNOSIS — R928 Other abnormal and inconclusive findings on diagnostic imaging of breast: Secondary | ICD-10-CM

## 2020-08-05 ENCOUNTER — Ambulatory Visit: Payer: Medicare HMO

## 2020-08-05 ENCOUNTER — Other Ambulatory Visit: Payer: Self-pay

## 2020-08-05 ENCOUNTER — Other Ambulatory Visit: Payer: Self-pay | Admitting: Family Medicine

## 2020-08-05 ENCOUNTER — Ambulatory Visit
Admission: RE | Admit: 2020-08-05 | Discharge: 2020-08-05 | Disposition: A | Discharge status: Home / Self Care | Payer: Medicare HMO | Source: Ambulatory Visit | Attending: Ophthalmology | Admitting: Ophthalmology

## 2020-08-05 DIAGNOSIS — R928 Other abnormal and inconclusive findings on diagnostic imaging of breast: Secondary | ICD-10-CM

## 2020-08-05 DIAGNOSIS — R921 Mammographic calcification found on diagnostic imaging of breast: Secondary | ICD-10-CM

## 2020-08-13 ENCOUNTER — Other Ambulatory Visit: Payer: Self-pay

## 2020-08-13 ENCOUNTER — Ambulatory Visit
Admission: RE | Admit: 2020-08-13 | Discharge: 2020-08-13 | Disposition: A | Discharge status: Home / Self Care | Payer: Medicare HMO | Source: Ambulatory Visit | Attending: Family Medicine | Admitting: Family Medicine

## 2020-08-13 ENCOUNTER — Other Ambulatory Visit: Payer: Self-pay | Admitting: Family Medicine

## 2020-08-13 DIAGNOSIS — D241 Benign neoplasm of right breast: Secondary | ICD-10-CM | POA: Diagnosis not present

## 2020-08-13 DIAGNOSIS — R928 Other abnormal and inconclusive findings on diagnostic imaging of breast: Secondary | ICD-10-CM

## 2020-08-13 DIAGNOSIS — R921 Mammographic calcification found on diagnostic imaging of breast: Secondary | ICD-10-CM | POA: Diagnosis not present

## 2020-08-16 ENCOUNTER — Telehealth (HOSPITAL_COMMUNITY): Payer: Self-pay | Admitting: Pharmacy Technician

## 2020-08-16 NOTE — Telephone Encounter (Addendum)
Advanced Heart Failure Patient Advocate Encounter   Patient was approved to receive Wilder Glade and Brilinta from [employer]  Patient ID: [id number] Effective dates: 08/16/20 through 07/30/21  Charlann Boxer, CPhT

## 2020-09-08 ENCOUNTER — Telehealth (HOSPITAL_COMMUNITY): Payer: Self-pay

## 2020-09-08 NOTE — Telephone Encounter (Deleted)
Advanced Heart Failure Patient Advocate Encounter   Patient was approved to receive Brilinta and Farxiga from [employer]  Patient ID: [id number] Effective dates: 08/16/2020 through 07/30/2021  Spoke with patient regarding assistance.   Penni Homans, Student-PharmD

## 2020-09-13 NOTE — Telephone Encounter (Signed)
Encounter opened in error

## 2020-09-13 NOTE — Telephone Encounter (Signed)
Opened in error

## 2020-09-14 ENCOUNTER — Telehealth (HOSPITAL_COMMUNITY): Payer: Self-pay | Admitting: Pharmacy Technician

## 2020-09-14 NOTE — Telephone Encounter (Signed)
Received communication from [employer] that they were missing the provider's portion of the application.   Sent in application via fax.  Will follow up.

## 2020-09-15 ENCOUNTER — Other Ambulatory Visit: Payer: Self-pay | Admitting: *Deleted

## 2020-09-15 MED ORDER — METFORMIN HCL 500 MG PO TABS
500.0000 mg | ORAL_TABLET | Freq: Every day | ORAL | 0 refills | Status: DC
Start: 2020-09-15 — End: 2020-09-22

## 2020-09-20 NOTE — Telephone Encounter (Signed)
Advanced Heart Failure Patient Advocate Encounter   Patient was approved to receive Entrsto from [employer]  Patient ID: [id number] Effective dates: 09/18/20 through 07/30/21  Spoke with patient.  Charlann Boxer, CPhT

## 2020-09-21 ENCOUNTER — Telehealth (HOSPITAL_COMMUNITY): Payer: Self-pay | Admitting: Pharmacy Technician

## 2020-09-21 NOTE — Telephone Encounter (Signed)
Patient called in requesting the phone number to [employer], to call for a refill of Entresto. Phone number provided. Lambert, CPhT

## 2020-09-22 ENCOUNTER — Ambulatory Visit (INDEPENDENT_AMBULATORY_CARE_PROVIDER_SITE_OTHER): Payer: Medicare HMO | Admitting: Family Medicine

## 2020-09-22 ENCOUNTER — Other Ambulatory Visit: Payer: Self-pay

## 2020-09-22 ENCOUNTER — Encounter: Payer: Self-pay | Admitting: Family Medicine

## 2020-09-22 VITALS — BP 144/77 | HR 81 | Temp 97.2°F | Ht 64.0 in | Wt 169.2 lb

## 2020-09-22 DIAGNOSIS — R030 Elevated blood-pressure reading, without diagnosis of hypertension: Secondary | ICD-10-CM | POA: Diagnosis not present

## 2020-09-22 DIAGNOSIS — F321 Major depressive disorder, single episode, moderate: Secondary | ICD-10-CM | POA: Diagnosis not present

## 2020-09-22 DIAGNOSIS — E1165 Type 2 diabetes mellitus with hyperglycemia: Secondary | ICD-10-CM

## 2020-09-22 DIAGNOSIS — I252 Old myocardial infarction: Secondary | ICD-10-CM | POA: Diagnosis not present

## 2020-09-22 DIAGNOSIS — G479 Sleep disorder, unspecified: Secondary | ICD-10-CM | POA: Diagnosis not present

## 2020-09-22 DIAGNOSIS — F410 Panic disorder [episodic paroxysmal anxiety] without agoraphobia: Secondary | ICD-10-CM

## 2020-09-22 DIAGNOSIS — I5041 Acute combined systolic (congestive) and diastolic (congestive) heart failure: Secondary | ICD-10-CM | POA: Diagnosis not present

## 2020-09-22 DIAGNOSIS — Z1211 Encounter for screening for malignant neoplasm of colon: Secondary | ICD-10-CM

## 2020-09-22 DIAGNOSIS — R69 Illness, unspecified: Secondary | ICD-10-CM | POA: Diagnosis not present

## 2020-09-22 DIAGNOSIS — F411 Generalized anxiety disorder: Secondary | ICD-10-CM | POA: Diagnosis not present

## 2020-09-22 LAB — BAYER DCA HB A1C WAIVED: HB A1C (BAYER DCA - WAIVED): 7.6 % — ABNORMAL HIGH (ref <7.0)

## 2020-09-22 MED ORDER — ATORVASTATIN CALCIUM 80 MG PO TABS: 80.0000 mg | ORAL_TABLET | Freq: Every day | ORAL | 1 refills | Status: DC

## 2020-09-22 MED ORDER — HYDROXYZINE HCL 10 MG PO TABS: 10.0000 mg | ORAL_TABLET | Freq: Three times a day (TID) | ORAL | 2 refills | Status: DC | PRN

## 2020-09-22 MED ORDER — FUROSEMIDE 20 MG PO TABS: 20.0000 mg | ORAL_TABLET | Freq: Every day | ORAL | 5 refills | Status: DC | PRN

## 2020-09-22 MED ORDER — SPIRONOLACTONE 25 MG PO TABS: 25.0000 mg | ORAL_TABLET | Freq: Every day | ORAL | 1 refills | Status: DC

## 2020-09-22 MED ORDER — METFORMIN HCL 500 MG PO TABS: 500.0000 mg | ORAL_TABLET | Freq: Two times a day (BID) | ORAL | 1 refills | Status: DC

## 2020-09-22 MED ORDER — CITALOPRAM HYDROBROMIDE 20 MG PO TABS: 20.0000 mg | ORAL_TABLET | Freq: Every day | ORAL | 2 refills | Status: DC

## 2020-09-22 NOTE — Patient Instructions (Signed)
Unisom, Melatonin (3-10 mg), or Zzzquil for sleep.   Please schedule a diabetic eye exam.    Diabetes Mellitus and Nutrition, Adult When you have diabetes, or diabetes mellitus, it is very important to have healthy eating habits because your blood sugar (glucose) levels are greatly affected by what you eat and drink. Eating healthy foods in the right amounts, at about the same times every day, can help you:  Control your blood glucose.  Lower your risk of heart disease.  Improve your blood pressure.  Reach or maintain a healthy weight. What can affect my meal plan? Every person with diabetes is different, and each person has different needs for a meal plan. Your health care provider may recommend that you work with a dietitian to make a meal plan that is best for you. Your meal plan may vary depending on factors such as:  The calories you need.  The medicines you take.  Your weight.  Your blood glucose, blood pressure, and cholesterol levels.  Your activity level.  Other health conditions you have, such as heart or kidney disease. How do carbohydrates affect me? Carbohydrates, also called carbs, affect your blood glucose level more than any other type of food. Eating carbs naturally raises the amount of glucose in your blood. Carb counting is a method for keeping track of how many carbs you eat. Counting carbs is important to keep your blood glucose at a healthy level, especially if you use insulin or take certain oral diabetes medicines. It is important to know how many carbs you can safely have in each meal. This is different for every person. Your dietitian can help you calculate how many carbs you should have at each meal and for each snack. How does alcohol affect me? Alcohol can cause a sudden decrease in blood glucose (hypoglycemia), especially if you use insulin or take certain oral diabetes medicines. Hypoglycemia can be a life-threatening condition. Symptoms of  hypoglycemia, such as sleepiness, dizziness, and confusion, are similar to symptoms of having too much alcohol.  Do not drink alcohol if: ? Your health care provider tells you not to drink. ? You are pregnant, may be pregnant, or are planning to become pregnant.  If you drink alcohol: ? Do not drink on an empty stomach. ? Limit how much you use to:  0-1 drink a day for women.  0-2 drinks a day for men. ? Be aware of how much alcohol is in your drink. In the [place] one drink equals one 12 oz bottle of beer (355 mL), one 5 oz glass of wine (148 mL), or one 1 oz glass of hard liquor (44 mL). ? Keep yourself hydrated with water, diet soda, or unsweetened iced tea.  Keep in mind that regular soda, juice, and other mixers may contain a lot of sugar and must be counted as carbs. What are tips for following this plan? Reading food labels  Start by checking the serving size on the "Nutrition Facts" label of packaged foods and drinks. The amount of calories, carbs, fats, and other nutrients listed on the label is based on one serving of the item. Many items contain more than one serving per package.  Check the total grams (g) of carbs in one serving. You can calculate the number of servings of carbs in one serving by dividing the total carbs by 15. For example, if a food has 30 g of total carbs per serving, it would be equal to 2 servings of carbs.  Check the number of grams (g) of saturated fats and trans fats in one serving. Choose foods that have a low amount or none of these fats.  Check the number of milligrams (mg) of salt (sodium) in one serving. Most people should limit total sodium intake to less than 2,300 mg per day.  Always check the nutrition information of foods labeled as "low-fat" or "nonfat." These foods may be higher in added sugar or refined carbs and should be avoided.  Talk to your dietitian to identify your daily goals for nutrients listed on the label. Shopping  Avoid  buying canned, pre-made, or processed foods. These foods tend to be high in fat, sodium, and added sugar.  Shop around the outside edge of the grocery store. This is where you will most often find fresh fruits and vegetables, bulk grains, fresh meats, and fresh dairy. Cooking  Use low-heat cooking methods, such as baking, instead of high-heat cooking methods like deep frying.  Cook using healthy oils, such as olive, canola, or sunflower oil.  Avoid cooking with butter, cream, or high-fat meats. Meal planning  Eat meals and snacks regularly, preferably at the same times every day. Avoid going long periods of time without eating.  Eat foods that are high in fiber, such as fresh fruits, vegetables, beans, and whole grains. Talk with your dietitian about how many servings of carbs you can eat at each meal.  Eat 4-6 oz (112-168 g) of lean protein each day, such as lean meat, chicken, fish, eggs, or tofu. One ounce (oz) of lean protein is equal to: ? 1 oz (28 g) of meat, chicken, or fish. ? 1 egg. ?  cup (62 g) of tofu.  Eat some foods each day that contain healthy fats, such as avocado, nuts, seeds, and fish.   What foods should I eat? Fruits Berries. Apples. Oranges. Peaches. Apricots. Plums. Grapes. Mango. Papaya. Pomegranate. Kiwi. Cherries. Vegetables Lettuce. Spinach. Leafy greens, including kale, chard, collard greens, and mustard greens. Beets. Cauliflower. Cabbage. Broccoli. Carrots. Green beans. Tomatoes. Peppers. Onions. Cucumbers. Brussels sprouts. Grains Whole grains, such as whole-wheat or whole-grain bread, crackers, tortillas, cereal, and pasta. Unsweetened oatmeal. Quinoa. Boulay or wild rice. Meats and other proteins Seafood. Poultry without skin. Lean cuts of poultry and beef. Tofu. Nuts. Seeds. Dairy Low-fat or fat-free dairy products such as milk, yogurt, and cheese. The items listed above may not be a complete list of foods and beverages you can eat. Contact a  dietitian for more information. What foods should I avoid? Fruits Fruits canned with syrup. Vegetables Canned vegetables. Frozen vegetables with butter or cream sauce. Grains Refined white flour and flour products such as bread, pasta, snack foods, and cereals. Avoid all processed foods. Meats and other proteins Fatty cuts of meat. Poultry with skin. Breaded or fried meats. Processed meat. Avoid saturated fats. Dairy Full-fat yogurt, cheese, or milk. Beverages Sweetened drinks, such as soda or iced tea. The items listed above may not be a complete list of foods and beverages you should avoid. Contact a dietitian for more information. Questions to ask a health care provider  Do I need to meet with a diabetes educator?  Do I need to meet with a dietitian?  What number can I call if I have questions?  When are the best times to check my blood glucose? Where to find more information:  American Diabetes Association: diabetes.org  Academy of Nutrition and Dietetics: www.eatright.[employer] of Diabetes and Digestive and Kidney Diseases:  [employer]  Association of Diabetes Care and Education Specialists: www.diabeteseducator.org Summary  It is important to have healthy eating habits because your blood sugar (glucose) levels are greatly affected by what you eat and drink.  A healthy meal plan will help you control your blood glucose and maintain a healthy lifestyle.  Your health care provider may recommend that you work with a dietitian to make a meal plan that is best for you.  Keep in mind that carbohydrates (carbs) and alcohol have immediate effects on your blood glucose levels. It is important to count carbs and to use alcohol carefully. This information is not intended to replace advice given to you by your health care provider. Make sure you discuss any questions you have with your health care provider. Document Revised: 06/24/2019 Document Reviewed:  06/24/2019 Elsevier Patient Education  2021 [employer].

## 2020-09-22 NOTE — Progress Notes (Signed)
Assessment & Plan:  1. Type 2 diabetes mellitus with hyperglycemia, without long-term current use of insulin (HCC) Lab Results  Component Value Date   HGBA1C 7.6 (H) 09/22/2020   HGBA1C 8.2 (H) 03/22/2020   HGBA1C 9.5 (H) 02/17/2020    - Diabetes is not at goal of A1c < 7. - Medications: continue Farxiga; increase metformin from 500 mg QD to BID - Home glucose monitoring: continue monitoring - Patient is currently taking a statin. Patient is taking an ACE-inhibitor/ARB.  - Instruction/counseling given: reminded to get eye exam, discussed foot care and provided printed educational material  Diabetes Health Maintenance Due  Topic Date Due  . OPHTHALMOLOGY EXAM  Never done  . HEMOGLOBIN A1C  03/22/2021  . FOOT EXAM  09/22/2021    Lab Results  Component Value Date   LABMICR 21.7 03/22/2020   - Lipid panel - CBC with Differential/Platelet - CMP14+EGFR - Bayer DCA Hb A1c Waived - TSH - atorvastatin (LIPITOR) 80 MG tablet; Take 1 tablet (80 mg total) by mouth at bedtime.  Dispense: 90 tablet; Refill: 1 - metFORMIN (GLUCOPHAGE) 500 MG tablet; Take 1 tablet (500 mg total) by mouth 2 (two) times daily with a meal.  Dispense: 180 tablet; Refill: 1  2. Elevated BP without diagnosis of hypertension Possibly due to stress. Patient will monitor. If elevation persists, will address.  3. Generalized anxiety disorder with panic attacks Uncontrolled. Rx'd Celexa and Atarax. - citalopram (CELEXA) 20 MG tablet; Take 1 tablet (20 mg total) by mouth daily.  Dispense: 30 tablet; Refill: 2 - hydrOXYzine (ATARAX/VISTARIL) 10 MG tablet; Take 1 tablet (10 mg total) by mouth 3 (three) times daily as needed for anxiety.  Dispense: 30 tablet; Refill: 2  4. Single major depressive episode, moderate (HCC) Uncontrolled. Rx'd Celexa.  - citalopram (CELEXA) 20 MG tablet; Take 1 tablet (20 mg total) by mouth daily.  Dispense: 30 tablet; Refill: 2  5. Difficulty sleeping Advised to try OTC medications  such as Unisom, Melatonin, and Zzzquil.  6. Colon cancer screening - Cologuard  7. History of non-ST elevation myocardial infarction (NSTEMI) Managed by cardiology. - Lipid panel - atorvastatin (LIPITOR) 80 MG tablet; Take 1 tablet (80 mg total) by mouth at bedtime.  Dispense: 90 tablet; Refill: 1  8. 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 - furosemide (LASIX) 20 MG tablet; Take 1 tablet (20 mg total) by mouth daily as needed.  Dispense: 30 tablet; Refill: 5 - spironolactone (ALDACTONE) 25 MG tablet; Take 1 tablet (25 mg total) by mouth daily.  Dispense: 90 tablet; Refill: 1   Return in about 6 weeks (around 11/03/2020) for anxiety.  Hendricks Limes, MSN, APRN, FNP-C [facility]  Subjective:    Patient ID: Becky Boyle, female    DOB: [birth year], 70 y.o.   MRN: [id number]  Patient Care Team: [name], FNP as PCP - General (Family Medicine)   Chief Complaint:  Chief Complaint  Patient presents with  . Diabetes    Check up of chronic medical conditions   . Panic Attack    Patient states she has been having a few in the last month.   . Shortness of Breath    X 1 month    HPI: Becky Boyle is a 70 y.o. female presenting on 09/22/2020 for Diabetes (Check up of chronic medical conditions ) and Panic Attack (Patient states she has been having a few in the last month./)  Diabetes: Patient  presents for follow up of diabetes. Current symptoms include: hyperglycemia. Known diabetic complications: cardiovascular disease. Medication compliance: yes - her cardiologist decreased her metformin to once daily and added Farxiga 10 mg >3 months ago due to heart failure. Current diet: in general, a "healthy" diet  . Current exercise: walking. Home blood sugar records: BGs are running  consistent with Hgb A1C. Is she  on ACE inhibitor or angiotensin II receptor blocker? Yes. Is she on a statin? Yes.   Patient's blood  pressure is elevated today.  She has been very stressed out recently as her daughter just got arrested on drug charges.  She states she was checking her blood pressure at home and prior to everything that has been going on her daughter her readings were generally 120s/70s.  New complaints: Patient reports she has been having panic attacks since her daughter was arrested on these drug charges.  She describes this as getting short of breath and feeling like an elephant sitting on her chest.  She has not been on any previous medications to treat anxiety or panic attacks.  GAD 7 : Generalized Anxiety Score 09/22/2020 03/22/2020 03/22/2020  Nervous, Anxious, on Edge 3 0 0  Control/stop worrying 3 0 0  Worry too much - different things 3 0 0  Trouble relaxing 3 0 0  Restless 2 0 0  Easily annoyed or irritable 2 0 0  Afraid - awful might happen 2 0 0  Total GAD 7 Score 18 0 0  Anxiety Difficulty Somewhat difficult Not difficult at all Not difficult at all   Depression screen [facility] 2/9 09/22/2020 07/13/2020 03/30/2020  Decreased Interest 2 0 0  Down, Depressed, Hopeless 2 1 0  PHQ - 2 Score 4 1 0  Altered sleeping 3 - -  Tired, decreased energy 2 - -  Change in appetite 3 - -  Feeling bad or failure about yourself  2 - -  Trouble concentrating 1 - -  Moving slowly or fidgety/restless 1 - -  Suicidal thoughts 0 - -  PHQ-9 Score 16 - -  Difficult doing work/chores Somewhat difficult - -   Social history:  Relevant past medical, surgical, family and social history reviewed and updated as indicated. Interim medical history since our last visit reviewed.  Allergies and medications reviewed and updated.  DATA REVIEWED: CHART IN EPIC  ROS: Negative unless specifically indicated above in HPI.    Current Outpatient Medications:  .  aspirin EC 81 MG tablet, Take 1 tablet (81 mg total) by mouth daily. Swallow whole., Disp: 150 tablet, Rfl: 2 .  atorvastatin (LIPITOR) 80 MG tablet, Take 1 tablet (80  mg total) by mouth at bedtime., Disp: 30 tablet, Rfl: 5 .  carvedilol (COREG) 12.5 MG tablet, Take 1 tablet (12.5 mg total) by mouth 2 (two) times daily with a meal., Disp: 180 tablet, Rfl: 3 .  cetirizine (ZYRTEC) 10 MG tablet, Take 10 mg by mouth as needed for allergies., Disp: , Rfl:  .  dapagliflozin propanediol (FARXIGA) 10 MG TABS tablet, Take 1 tablet (10 mg total) by mouth daily before breakfast., Disp: 30 tablet, Rfl: 6 .  furosemide (LASIX) 20 MG tablet, Take 20 mg by mouth daily as needed., Disp: , Rfl:  .  Lancets (ONETOUCH DELICA PLUS [id number]) MISC, Apply 1 each topically daily., Disp: 1 each, Rfl: 2 .  metFORMIN (GLUCOPHAGE) 500 MG tablet, Take 1 tablet (500 mg total) by mouth daily with breakfast., Disp: 90 tablet, Rfl: 0 .  nitroGLYCERIN (NITROSTAT) 0.4 MG SL tablet, Place 1 tablet (0.4 mg total) under the tongue every 5 (five) minutes x 3 doses as needed for chest pain., Disp: 25 tablet, Rfl: 2 .  ONETOUCH VERIO test strip, CHECK BLOOD SUGAR ONCE DAILY OR AS DIRECTED, Disp: 50 strip, Rfl: 0 .  sacubitril-valsartan (ENTRESTO) 49-51 MG, Take 1 tablet by mouth 2 (two) times daily., Disp: 60 tablet, Rfl: 5 .  sodium chloride (OCEAN) 0.65 % SOLN nasal spray, Place 1 spray into both nostrils as needed for congestion., Disp: 30 mL, Rfl: 3 .  spironolactone (ALDACTONE) 25 MG tablet, Take 1 tablet (25 mg total) by mouth daily., Disp: 30 tablet, Rfl: 5 .  ticagrelor (BRILINTA) 90 MG TABS tablet, Take 1 tablet (90 mg total) by mouth 2 (two) times daily., Disp: 60 tablet, Rfl: 11   Allergies  Allergen Reactions  . Latex Anaphylaxis  . Penicillins Hives  . Codeine Rash  . Tape Rash   Past Medical History:  Diagnosis Date  . Benign brain tumor [facility])     Past Surgical History:  Procedure Laterality Date  . ABDOMINAL HYSTERECTOMY    . BRAIN SURGERY  1989   tumor   . CORONARY STENT INTERVENTION N/A 02/17/2020   Procedure: CORONARY STENT INTERVENTION;  Surgeon: Jettie Booze,  MD;  Location: [facility];  Service: Cardiovascular;  Laterality: N/A;  . RIGHT/LEFT HEART CATH AND CORONARY ANGIOGRAPHY N/A 02/17/2020   Procedure: RIGHT/LEFT HEART CATH AND CORONARY ANGIOGRAPHY;  Surgeon: Jettie Booze, MD;  Location: [facility];  Service: Cardiovascular;  Laterality: N/A;    Social History   Socioeconomic History  . Marital status: Married    Spouse name: Not on file  . Number of children: Not on file  . Years of education: Not on file  . Highest education level: Not on file  Occupational History  . Not on file  Tobacco Use  . Smoking status: Never Smoker  . Smokeless tobacco: Never Used  Substance and Sexual Activity  . Alcohol use: No  . Drug use: No  . Sexual activity: Not on file  Other Topics Concern  . Not on file  Social History Narrative  . Not on file   Social Determinants of Health   Financial Resource Strain: Not on file  Food Insecurity: Not on file  Transportation Needs: Not on file  Physical Activity: Not on file  Stress: Not on file  Social Connections: Not on file  Intimate Partner Violence: Not on file        Objective:    BP (!) 144/77   Pulse 81   Temp (!) 97.2 F (36.2 C) (Temporal)   Ht '5\' 4"'  (1.626 m)   Wt 169 lb 3.2 oz (76.7 kg)   SpO2 96%   BMI 29.04 kg/m   Wt Readings from Last 3 Encounters:  09/22/20 169 lb 3.2 oz (76.7 kg)  05/13/20 157 lb 12.8 oz (71.6 kg)  04/07/20 156 lb 12.8 oz (71.1 kg)    Physical Exam Vitals reviewed.  Constitutional:      General: She is not in acute distress.    Appearance: Normal appearance. She is overweight. She is not ill-appearing, toxic-appearing or diaphoretic.  HENT:     Head: Normocephalic and atraumatic.  Eyes:     General: No scleral icterus.       Right eye: No discharge.        Left eye: No discharge.     Conjunctiva/sclera:  Conjunctivae normal.  Cardiovascular:     Rate and Rhythm: Normal rate and regular rhythm.     Heart sounds: Normal  heart sounds. No murmur heard. No friction rub. No gallop.   Pulmonary:     Effort: Pulmonary effort is normal. No respiratory distress.     Breath sounds: Normal breath sounds. No stridor. No wheezing, rhonchi or rales.  Musculoskeletal:        General: Normal range of motion.     Cervical back: Normal range of motion.  Skin:    General: Skin is warm and dry.     Capillary Refill: Capillary refill takes less than 2 seconds.  Neurological:     General: No focal deficit present.     Mental Status: She is alert and oriented to person, place, and time. Mental status is at baseline.  Psychiatric:        Mood and Affect: Mood normal.        Behavior: Behavior normal.        Thought Content: Thought content normal.        Judgment: Judgment normal.    Diabetic Foot Exam - Simple   Simple Foot Form Diabetic Foot exam was performed with the following findings: Yes 09/22/2020  9:00 AM  Visual Inspection No deformities, no ulcerations, no other skin breakdown bilaterally: Yes Sensation Testing Intact to touch and monofilament testing bilaterally: Yes Pulse Check Posterior Tibialis and Dorsalis pulse intact bilaterally: Yes Comments     Lab Results  Component Value Date   TSH 7.480 (H) 03/22/2020   Lab Results  Component Value Date   WBC 10.6 (H) 02/24/2020   HGB 13.8 02/24/2020   HCT 43.7 02/24/2020   MCV 91.8 02/24/2020   PLT 329 02/24/2020   Lab Results  Component Value Date   NA 140 05/13/2020   K 3.7 05/13/2020   CO2 25 05/13/2020   GLUCOSE 151 (H) 05/13/2020   BUN 23 05/13/2020   CREATININE 1.15 (H) 05/13/2020   BILITOT 1.1 02/17/2020   ALKPHOS 84 02/17/2020   AST 52 (H) 02/17/2020   ALT 54 (H) 02/17/2020   PROT 7.2 02/17/2020   ALBUMIN 3.5 02/17/2020   CALCIUM 9.6 05/13/2020   ANIONGAP 14 05/13/2020   Lab Results  Component Value Date   CHOL 96 (L) 03/22/2020   Lab Results  Component Value Date   HDL 27 (L) 03/22/2020   Lab Results  Component Value  Date   LDLCALC 37 03/22/2020   Lab Results  Component Value Date   TRIG 197 (H) 03/22/2020   Lab Results  Component Value Date   CHOLHDL 3.6 03/22/2020   Lab Results  Component Value Date   HGBA1C 8.2 (H) 03/22/2020

## 2020-09-23 LAB — CMP14+EGFR
ALT: 13 IU/L (ref 0–32)
AST: 15 IU/L (ref 0–40)
Albumin/Globulin Ratio: 2 (ref 1.2–2.2)
Albumin: 4.7 g/dL (ref 3.8–4.8)
Alkaline Phosphatase: 99 IU/L (ref 44–121)
BUN/Creatinine Ratio: 16 (ref 12–28)
BUN: 16 mg/dL (ref 8–27)
Bilirubin Total: 0.6 mg/dL (ref 0.0–1.2)
CO2: 21 mmol/L (ref 20–29)
Calcium: 9.4 mg/dL (ref 8.7–10.3)
Chloride: 104 mmol/L (ref 96–106)
Creatinine, Ser: 1.02 mg/dL — ABNORMAL HIGH (ref 0.57–1.00)
GFR calc Af Amer: 65 mL/min/{1.73_m2} (ref >59)
GFR calc non Af Amer: 56 mL/min/{1.73_m2} — ABNORMAL LOW (ref >59)
Globulin, Total: 2.4 g/dL (ref 1.5–4.5)
Glucose: 168 mg/dL — ABNORMAL HIGH (ref 65–99)
Potassium: 4.3 mmol/L (ref 3.5–5.2)
Sodium: 141 mmol/L (ref 134–144)
Total Protein: 7.1 g/dL (ref 6.0–8.5)

## 2020-09-23 LAB — CBC WITH DIFFERENTIAL/PLATELET
Basophils Absolute: 0 10*3/uL (ref 0.0–0.2)
Basos: 0 %
EOS (ABSOLUTE): 0.2 10*3/uL (ref 0.0–0.4)
Eos: 3 %
Hematocrit: 40.7 % (ref 34.0–46.6)
Hemoglobin: 13.4 g/dL (ref 11.1–15.9)
Immature Grans (Abs): 0 10*3/uL (ref 0.0–0.1)
Immature Granulocytes: 0 %
Lymphocytes Absolute: 1.7 10*3/uL (ref 0.7–3.1)
Lymphs: 23 %
MCH: 30 pg (ref 26.6–33.0)
MCHC: 32.9 g/dL (ref 31.5–35.7)
MCV: 91 fL (ref 79–97)
Monocytes Absolute: 0.7 10*3/uL (ref 0.1–0.9)
Monocytes: 9 %
Neutrophils Absolute: 4.9 10*3/uL (ref 1.4–7.0)
Neutrophils: 65 %
Platelets: 236 10*3/uL (ref 150–450)
RBC: 4.46 x10E6/uL (ref 3.77–5.28)
RDW: 12.5 % (ref 11.7–15.4)
WBC: 7.5 10*3/uL (ref 3.4–10.8)

## 2020-09-23 LAB — LIPID PANEL
Chol/HDL Ratio: 2.9 ratio (ref 0.0–4.4)
Cholesterol, Total: 92 mg/dL — ABNORMAL LOW (ref 100–199)
HDL: 32 mg/dL — ABNORMAL LOW (ref >39)
LDL Chol Calc (NIH): 34 mg/dL (ref 0–99)
Triglycerides: 155 mg/dL — ABNORMAL HIGH (ref 0–149)
VLDL Cholesterol Cal: 26 mg/dL (ref 5–40)

## 2020-09-23 LAB — TSH: TSH: 9.89 u[IU]/mL — ABNORMAL HIGH (ref 0.450–4.500)

## 2020-09-24 ENCOUNTER — Encounter: Payer: Self-pay | Admitting: Family Medicine

## 2020-09-24 DIAGNOSIS — R7989 Other specified abnormal findings of blood chemistry: Secondary | ICD-10-CM | POA: Insufficient documentation

## 2020-09-24 DIAGNOSIS — F410 Panic disorder [episodic paroxysmal anxiety] without agoraphobia: Secondary | ICD-10-CM | POA: Insufficient documentation

## 2020-09-24 DIAGNOSIS — E1122 Type 2 diabetes mellitus with diabetic chronic kidney disease: Secondary | ICD-10-CM | POA: Insufficient documentation

## 2020-09-24 DIAGNOSIS — G479 Sleep disorder, unspecified: Secondary | ICD-10-CM | POA: Insufficient documentation

## 2020-09-24 DIAGNOSIS — F411 Generalized anxiety disorder: Secondary | ICD-10-CM | POA: Insufficient documentation

## 2020-09-24 DIAGNOSIS — R030 Elevated blood-pressure reading, without diagnosis of hypertension: Secondary | ICD-10-CM | POA: Insufficient documentation

## 2020-09-24 DIAGNOSIS — F321 Major depressive disorder, single episode, moderate: Secondary | ICD-10-CM | POA: Insufficient documentation

## 2020-09-24 DIAGNOSIS — N183 Chronic kidney disease, stage 3 unspecified: Secondary | ICD-10-CM | POA: Insufficient documentation

## 2020-09-24 HISTORY — DX: Panic disorder (episodic paroxysmal anxiety): F41.0

## 2020-10-19 DIAGNOSIS — Z1211 Encounter for screening for malignant neoplasm of colon: Secondary | ICD-10-CM | POA: Diagnosis not present

## 2020-10-26 LAB — COLOGUARD: COLOGUARD: NEGATIVE

## 2020-10-26 LAB — EXTERNAL GENERIC LAB PROCEDURE: COLOGUARD: NEGATIVE

## 2020-11-02 LAB — COLOGUARD: Cologuard: NEGATIVE

## 2020-12-08 ENCOUNTER — Ambulatory Visit: Payer: Medicare HMO | Admitting: Family Medicine

## 2021-02-14 ENCOUNTER — Other Ambulatory Visit: Payer: Self-pay | Admitting: Family

## 2021-02-14 DIAGNOSIS — I5041 Acute combined systolic (congestive) and diastolic (congestive) heart failure: Secondary | ICD-10-CM

## 2021-04-08 ENCOUNTER — Telehealth (HOSPITAL_COMMUNITY): Payer: Self-pay | Admitting: Pharmacist

## 2021-04-08 ENCOUNTER — Other Ambulatory Visit: Payer: Self-pay

## 2021-04-08 ENCOUNTER — Telehealth: Payer: Self-pay | Admitting: Family Medicine

## 2021-04-08 MED ORDER — DAPAGLIFLOZIN PROPANEDIOL 10 MG PO TABS
10.0000 mg | ORAL_TABLET | Freq: Every day | ORAL | 3 refills | Status: DC
Start: 2021-04-08 — End: 2021-11-07

## 2021-04-08 MED ORDER — TICAGRELOR 90 MG PO TABS
90.0000 mg | ORAL_TABLET | Freq: Two times a day (BID) | ORAL | 0 refills | Status: DC
Start: 2021-04-08 — End: 2021-04-08

## 2021-04-08 MED ORDER — TICAGRELOR 90 MG PO TABS
90.0000 mg | ORAL_TABLET | Freq: Two times a day (BID) | ORAL | 3 refills | Status: DC
Start: 2021-04-08 — End: 2021-04-11

## 2021-04-08 NOTE — Telephone Encounter (Signed)
Prescriptions for Wilder Glade and Brilinta sent to [facility] PAP program ([facility]).  Audry Riles, PharmD, BCPS, BCCP, CPP Heart Failure Clinic Pharmacist [phone]

## 2021-04-08 NOTE — Telephone Encounter (Signed)
  Prescription Request  04/08/2021  What is the name of the medication or equipment? Wilder Glade and BRILINTA  Have you contacted your pharmacy to request a refill? YES  Which pharmacy would you like this sent to?  [phone] (FAX#) [facility]  Pt had last OV with PCP in FEB 2022.

## 2021-04-08 NOTE — Telephone Encounter (Signed)
Appointment scheduled. 30 day refill on the one medication we have filled

## 2021-04-08 NOTE — Telephone Encounter (Signed)
Please print and fax prescription for ticagrelor to [phone] (FAX#) [facility]

## 2021-04-11 ENCOUNTER — Telehealth (HOSPITAL_COMMUNITY): Payer: Self-pay | Admitting: Pharmacy Technician

## 2021-04-11 ENCOUNTER — Encounter (HOSPITAL_COMMUNITY): Payer: Self-pay

## 2021-04-11 MED ORDER — TICAGRELOR 90 MG PO TABS
90.0000 mg | ORAL_TABLET | Freq: Two times a day (BID) | ORAL | 3 refills | Status: DC
Start: 2021-04-11 — End: 2021-08-08

## 2021-04-11 NOTE — Telephone Encounter (Signed)
Printed in your box to sign.

## 2021-04-11 NOTE — Telephone Encounter (Signed)
Advanced Heart Failure Patient Advocate Encounter  Patient called in requesting help getting an RX of Brilinta and Farxiga sent to [facility]. Those were sent on Friday. Patient attempted to call the company, was on hold over 30 min. I called and am waiting for a return call.   Neville Route (RN) a request for a bottle of samples for each medication. Will follow up with [employer].

## 2021-04-11 NOTE — Progress Notes (Signed)
Medication Samples have been provided to the patient.  Drug name: Wilder Glade        Strength: 10 mg        Qty: 4  LOT: [id number]  Exp.Date: 06/30/2023  Dosing instructions: Take 1 tablet daily  The patient has been instructed regarding the correct time, dose, and frequency of taking this medication, including desired effects and most common side effects.   Juanita Laster Wakeelah Solan 4:10 PM 04/11/2021

## 2021-04-11 NOTE — Progress Notes (Signed)
Medication Samples have been provided to the patient.  Drug name: Brilinta       Strength: 90 mg        Qty: 4  LOT: [id number]  Exp.Date: 01/2023  Dosing instructions: Take 1 tablet Twice daily   The patient has been instructed regarding the correct time, dose, and frequency of taking this medication, including desired effects and most common side effects.   Becky Boyle 4:13 PM 04/11/2021

## 2021-04-12 NOTE — Telephone Encounter (Signed)
Signed and in my out basket

## 2021-04-14 NOTE — Telephone Encounter (Signed)
Advanced Heart Failure Patient Advocate Encounter  Called [employer]. They have had recent changes to their assistance organization and have had longer than usual hold times and pharmacy issues. The representative was able to, after some time get the patient an expedited shipment of both medications. She should expect to receive them in the next 3-5 business days. Called and spoke with the patient. Was appreciative of assistance.   Charlann Boxer, CPhT

## 2021-04-27 ENCOUNTER — Other Ambulatory Visit (HOSPITAL_COMMUNITY): Payer: Self-pay | Admitting: Cardiology

## 2021-04-29 ENCOUNTER — Telehealth (HOSPITAL_COMMUNITY): Payer: Self-pay | Admitting: Vascular Surgery

## 2021-04-29 MED ORDER — CARVEDILOL 12.5 MG PO TABS
12.5000 mg | ORAL_TABLET | Freq: Two times a day (BID) | ORAL | 0 refills | Status: DC
Start: 2021-04-29 — End: 2021-07-26

## 2021-04-29 NOTE — Telephone Encounter (Signed)
Pt needs  refill of Carvedilol sent to [facility]

## 2021-04-29 NOTE — Telephone Encounter (Signed)
Done

## 2021-05-05 ENCOUNTER — Other Ambulatory Visit: Payer: Self-pay

## 2021-05-05 ENCOUNTER — Ambulatory Visit (INDEPENDENT_AMBULATORY_CARE_PROVIDER_SITE_OTHER): Payer: Medicare Other | Admitting: Family Medicine

## 2021-05-05 ENCOUNTER — Encounter: Payer: Self-pay | Admitting: Family Medicine

## 2021-05-05 VITALS — BP 129/73 | HR 65 | Temp 97.3°F | Ht 64.0 in | Wt 165.6 lb

## 2021-05-05 DIAGNOSIS — I5042 Chronic combined systolic (congestive) and diastolic (congestive) heart failure: Secondary | ICD-10-CM | POA: Diagnosis not present

## 2021-05-05 DIAGNOSIS — F321 Major depressive disorder, single episode, moderate: Secondary | ICD-10-CM

## 2021-05-05 DIAGNOSIS — F411 Generalized anxiety disorder: Secondary | ICD-10-CM

## 2021-05-05 DIAGNOSIS — E1165 Type 2 diabetes mellitus with hyperglycemia: Secondary | ICD-10-CM

## 2021-05-05 DIAGNOSIS — I252 Old myocardial infarction: Secondary | ICD-10-CM | POA: Diagnosis not present

## 2021-05-05 DIAGNOSIS — R7989 Other specified abnormal findings of blood chemistry: Secondary | ICD-10-CM | POA: Diagnosis not present

## 2021-05-05 DIAGNOSIS — N1831 Chronic kidney disease, stage 3a: Secondary | ICD-10-CM

## 2021-05-05 DIAGNOSIS — F410 Panic disorder [episodic paroxysmal anxiety] without agoraphobia: Secondary | ICD-10-CM

## 2021-05-05 DIAGNOSIS — R030 Elevated blood-pressure reading, without diagnosis of hypertension: Secondary | ICD-10-CM | POA: Diagnosis not present

## 2021-05-05 LAB — BAYER DCA HB A1C WAIVED: HB A1C (BAYER DCA - WAIVED): 6.6 % — ABNORMAL HIGH (ref 4.8–5.6)

## 2021-05-05 MED ORDER — METFORMIN HCL 500 MG PO TABS: 500.0000 mg | ORAL_TABLET | Freq: Two times a day (BID) | ORAL | 1 refills | Status: DC

## 2021-05-05 MED ORDER — SPIRONOLACTONE 25 MG PO TABS: 25.0000 mg | ORAL_TABLET | Freq: Every day | ORAL | 1 refills | Status: DC

## 2021-05-05 MED ORDER — FUROSEMIDE 20 MG PO TABS: 20.0000 mg | ORAL_TABLET | Freq: Every day | ORAL | 5 refills | Status: DC | PRN

## 2021-05-05 MED ORDER — ATORVASTATIN CALCIUM 80 MG PO TABS: 80.0000 mg | ORAL_TABLET | Freq: Every day | ORAL | 1 refills | Status: DC

## 2021-05-05 NOTE — Progress Notes (Signed)
Assessment & Plan:  1. Type 2 diabetes mellitus with hyperglycemia, without long-term current use of insulin (HCC) - Well controlled on current regimen - encouraged healthy diet and exercise Lab Results  Component Value Date   HGBA1C 6.6 (H) 05/05/2021   HGBA1C 7.6 (H) 09/22/2020   HGBA1C 8.2 (H) 03/22/2020    - Diabetes is at goal of A1c < 7. - Medications: continue current medications - Home glucose monitoring: continue monitoring - Patient is currently taking a statin. Patient is taking an ACE-inhibitor/ARB.  - Instruction/counseling given: keep upcoming appointment for eye exam  Diabetes Health Maintenance Due  Topic Date Due   OPHTHALMOLOGY EXAM  Never done   FOOT EXAM  09/22/2021   HEMOGLOBIN A1C  11/03/2021    Lab Results  Component Value Date   LABMICR 21.7 03/22/2020   - Lipid panel - CBC with Differential/Platelet - CMP14+EGFR - Bayer DCA Hb A1c Waived - atorvastatin (LIPITOR) 80 MG tablet; Take 1 tablet (80 mg total) by mouth at bedtime.  Dispense: 90 tablet; Refill: 1 - metFORMIN (GLUCOPHAGE) 500 MG tablet; Take 1 tablet (500 mg total) by mouth 2 (two) times daily with a meal.  Dispense: 180 tablet; Refill: 1  2. Chronic combined systolic and diastolic heart failure (HCC) - continue medications prescribed by cardiology - furosemide (LASIX) 20 MG tablet; Take 1 tablet (20 mg total) by mouth daily as needed.  Dispense: 30 tablet; Refill: 5 - spironolactone (ALDACTONE) 25 MG tablet; Take 1 tablet (25 mg total) by mouth daily.  Dispense: 90 tablet; Refill: 1  3-4. Generalized anxiety disorder with panic attacks/ Single major depressive episode, moderate (HCC) - scores improved today from previous visit despite not taking medication  5. History of non-ST elevation myocardial infarction (NSTEMI) - atorvastatin (LIPITOR) 80 MG tablet; Take 1 tablet (80 mg total) by mouth at bedtime.  Dispense: 90 tablet; Refill: 1  6. Stage 3a chronic kidney disease ([place]) -  continue Entresto - CMP14+EGFR  7. Elevated TSH - TSH  Encouraged to check vital signs when she feels the out of body sensation.    Return in about 6 months (around 11/03/2021) for annual physical.  Lucile Crater, NP Student  I personally was present during the history, physical exam, and medical decision-making activities of this service and have verified that the service and findings are accurately documented in the nurse practitioner student's note.  Hendricks Limes, MSN, APRN, FNP-C [facility]   Subjective:    Patient ID: Becky Boyle, female    DOB: 01-15-1951, 70 y.o.   MRN: [id number]  Patient Care Team: [name], FNP as PCP - General (Family Medicine) Larey Dresser, MD as Consulting Physician (Cardiology)   Chief Complaint:  Chief Complaint  Patient presents with   Diabetes   Hypertension    6 month follow up of chronic medical conditions. Patient states that last 2 weeks on and off when she walks up her 6 steps and gets to the stop her body feels "numb".     HPI: Becky Boyle is a 70 y.o. female presenting on 05/05/2021 for Diabetes and Hypertension (6 month follow up of chronic medical conditions. Patient states that last 2 weeks on and off when she walks up her 6 steps and gets to the stop her body feels "numb". )  Diabetes: Patient presents for follow up of diabetes. Current symptoms include: hyperglycemia. Known diabetic complications: cardiovascular disease. Medication compliance: yes Current diet: in general, a "healthy" diet  .  Current exercise: walking. Home blood sugar records: BGs are running  consistent with Hgb A1C. Is she  on ACE inhibitor or angiotensin II receptor blocker? Yes. Is she on a statin? Yes.   She has an eye exam scheduled for 05/26/21 with Dr. Ronnald Ramp at [facility]   Hypertension: Taking coreg 12.70m, Entresto 49-51 mg, spironolactone 25 mg. She takes her BP at home and gets readings 120s-140s. She  has an appointment scheduled with her cardiologist for 07/07/21.   Anxiety/Depression: At her previous visit she was put on Celexa with prn Hydroxyzine. She states the celexa made her have nightmares, have feelings that something bad was going to happen to her, and made her anxiety worse. She stopped taking it all together. She does not want to take anything for anxiety at this time, she states she manages well without it.   GAD 7 : Generalized Anxiety Score 05/05/2021 09/22/2020 03/22/2020 03/22/2020  Nervous, Anxious, on Edge 0 3 0 0  Control/stop worrying 0 3 0 0  Worry too much - different things 1 3 0 0  Trouble relaxing 0 3 0 0  Restless 0 2 0 0  Easily annoyed or irritable 1 2 0 0  Afraid - awful might happen 0 2 0 0  Total GAD 7 Score 2 18 0 0  Anxiety Difficulty Not difficult at all Somewhat difficult Not difficult at all Not difficult at all   [facility] 05/05/2021 09/22/2020 07/13/2020  Decreased Interest 0 2 0  Down, Depressed, Hopeless 0 2 1  PHQ - 2 Score 0 4 1  Altered sleeping 1 3 -  Tired, decreased energy 2 2 -  Change in appetite 2 3 -  Feeling bad or failure about yourself  0 2 -  Trouble concentrating 0 1 -  Moving slowly or fidgety/restless 0 1 -  Suicidal thoughts 0 0 -  PHQ-9 Score 5 16 -  Difficult doing work/chores Not difficult at all Somewhat difficult -   New Complaints: She reports that occasionally when she climbs the steps of her mobile home she experiences an out of body experience and feels like her body is numb. She states she sits on the couch for a few minutes and the symptoms resolve. She states she doesn't feel dizzy or off balance when this occurs. She also states she does not feel a racing heart rate when this happens. The events only occur for a few minutes.   Social history:  Relevant past medical, surgical, family and social history reviewed and updated as indicated. Interim medical history since our last visit  reviewed.  Allergies and medications reviewed and updated.  DATA REVIEWED: CHART IN EPIC  ROS: Negative unless specifically indicated above in HPI.    Current Outpatient Medications:    atorvastatin (LIPITOR) 80 MG tablet, Take 1 tablet (80 mg total) by mouth at bedtime., Disp: 90 tablet, Rfl: 1   carvedilol (COREG) 12.5 MG tablet, Take 1 tablet (12.5 mg total) by mouth 2 (two) times daily with a meal., Disp: 180 tablet, Rfl: 0   cetirizine (ZYRTEC) 10 MG tablet, Take 10 mg by mouth as needed for allergies., Disp: , Rfl:    dapagliflozin propanediol (FARXIGA) 10 MG TABS tablet, Take 1 tablet (10 mg total) by mouth daily before breakfast., Disp: 90 tablet, Rfl: 3   furosemide (LASIX) 20 MG tablet, Take 1 tablet (20 mg total) by mouth daily as needed., Disp: 30 tablet, Rfl: 5   Lancets ([place]  LANCET33G) MISC, Apply 1 each topically daily., Disp: 1 each, Rfl: 2   metFORMIN (GLUCOPHAGE) 500 MG tablet, Take 1 tablet (500 mg total) by mouth 2 (two) times daily with a meal., Disp: 180 tablet, Rfl: 1   nitroGLYCERIN (NITROSTAT) 0.4 MG SL tablet, Place 1 tablet (0.4 mg total) under the tongue every 5 (five) minutes x 3 doses as needed for chest pain., Disp: 25 tablet, Rfl: 2   ONETOUCH VERIO test strip, CHECK BLOOD SUGAR ONCE DAILY OR AS DIRECTED, Disp: 50 strip, Rfl: 0   sacubitril-valsartan (ENTRESTO) 49-51 MG, Take 1 tablet by mouth 2 (two) times daily., Disp: 60 tablet, Rfl: 5   sodium chloride (OCEAN) 0.65 % SOLN nasal spray, Place 1 spray into both nostrils as needed for congestion., Disp: 30 mL, Rfl: 3   spironolactone (ALDACTONE) 25 MG tablet, Take 1 tablet (25 mg total) by mouth daily., Disp: 90 tablet, Rfl: 1   ticagrelor (BRILINTA) 90 MG TABS tablet, Take 1 tablet (90 mg total) by mouth 2 (two) times daily., Disp: 180 tablet, Rfl: 3   Allergies  Allergen Reactions   Latex Anaphylaxis   Penicillins Hives   Codeine Rash   Tape Rash   Past Medical History:  Diagnosis  Date   Anxiety    with panic attacks   Benign brain tumor (HCC)    CKD (chronic kidney disease) stage 3, GFR 30-59 ml/min (HCC)    Depression    Diabetes (HCC)    Heart failure (HCC)    History of non-ST elevation myocardial infarction (NSTEMI)     Past Surgical History:  Procedure Laterality Date   ABDOMINAL HYSTERECTOMY     BRAIN SURGERY  1989   tumor    CORONARY STENT INTERVENTION N/A 02/17/2020   Procedure: CORONARY STENT INTERVENTION;  Surgeon: Jettie Booze, MD;  Location: [facility];  Service: Cardiovascular;  Laterality: N/A;   RIGHT/LEFT HEART CATH AND CORONARY ANGIOGRAPHY N/A 02/17/2020   Procedure: RIGHT/LEFT HEART CATH AND CORONARY ANGIOGRAPHY;  Surgeon: Jettie Booze, MD;  Location: [facility];  Service: Cardiovascular;  Laterality: N/A;    Social History   Socioeconomic History   Marital status: Married    Spouse name: Not on file   Number of children: Not on file   Years of education: Not on file   Highest education level: Not on file  Occupational History   Not on file  Tobacco Use   Smoking status: Never   Smokeless tobacco: Never  Substance and Sexual Activity   Alcohol use: No   Drug use: No   Sexual activity: Not on file  Other Topics Concern   Not on file  Social History Narrative   Not on file   Social Determinants of Health   Financial Resource Strain: Not on file  Food Insecurity: Not on file  Transportation Needs: Not on file  Physical Activity: Not on file  Stress: Not on file  Social Connections: Not on file  Intimate Partner Violence: Not on file        Objective:    BP 129/73   Pulse 65   Temp (!) 97.3 F (36.3 C) (Temporal)   Ht '5\' 4"'  (1.626 m)   Wt 75.1 kg   SpO2 97%   BMI 28.43 kg/m   Wt Readings from Last 3 Encounters:  05/05/21 165 lb 9.6 oz (75.1 kg)  09/22/20 169 lb 3.2 oz (76.7 kg)  05/13/20 157 lb 12.8 oz (71.6 kg)  Physical Exam Vitals reviewed.  Constitutional:       General: She is not in acute distress.    Appearance: Normal appearance. She is overweight. She is not ill-appearing, toxic-appearing or diaphoretic.  HENT:     Head: Normocephalic and atraumatic.  Eyes:     General: No scleral icterus.       Right eye: No discharge.        Left eye: No discharge.     Conjunctiva/sclera: Conjunctivae normal.  Cardiovascular:     Rate and Rhythm: Normal rate and regular rhythm.     Heart sounds: Normal heart sounds. No murmur heard.   No friction rub. No gallop.  Pulmonary:     Effort: Pulmonary effort is normal. No respiratory distress.     Breath sounds: Normal breath sounds. No stridor. No wheezing, rhonchi or rales.  Musculoskeletal:        General: Normal range of motion.     Cervical back: Normal range of motion.  Skin:    General: Skin is warm and dry.     Capillary Refill: Capillary refill takes less than 2 seconds.  Neurological:     General: No focal deficit present.     Mental Status: She is alert and oriented to person, place, and time. Mental status is at baseline.  Psychiatric:        Mood and Affect: Mood normal.        Behavior: Behavior normal.        Thought Content: Thought content normal.        Judgment: Judgment normal.   Diabetic Foot Exam - Simple   No data filed     Lab Results  Component Value Date   TSH 9.890 (H) 09/22/2020   Lab Results  Component Value Date   WBC 7.5 09/22/2020   HGB 13.4 09/22/2020   HCT 40.7 09/22/2020   MCV 91 09/22/2020   PLT 236 09/22/2020   Lab Results  Component Value Date   NA 141 09/22/2020   K 4.3 09/22/2020   CO2 21 09/22/2020   GLUCOSE 168 (H) 09/22/2020   BUN 16 09/22/2020   CREATININE 1.02 (H) 09/22/2020   BILITOT 0.6 09/22/2020   ALKPHOS 99 09/22/2020   AST 15 09/22/2020   ALT 13 09/22/2020   PROT 7.1 09/22/2020   ALBUMIN 4.7 09/22/2020   CALCIUM 9.4 09/22/2020   ANIONGAP 14 05/13/2020   Lab Results  Component Value Date   CHOL 92 (L) 09/22/2020   Lab  Results  Component Value Date   HDL 32 (L) 09/22/2020   Lab Results  Component Value Date   LDLCALC 34 09/22/2020   Lab Results  Component Value Date   TRIG 155 (H) 09/22/2020   Lab Results  Component Value Date   CHOLHDL 2.9 09/22/2020   Lab Results  Component Value Date   HGBA1C 7.6 (H) 09/22/2020

## 2021-05-06 LAB — CMP14+EGFR
ALT: 14 IU/L (ref 0–32)
AST: 16 IU/L (ref 0–40)
Albumin/Globulin Ratio: 1.8 (ref 1.2–2.2)
Albumin: 4.7 g/dL (ref 3.8–4.8)
Alkaline Phosphatase: 110 IU/L (ref 44–121)
BUN/Creatinine Ratio: 17 (ref 12–28)
BUN: 17 mg/dL (ref 8–27)
Bilirubin Total: 0.6 mg/dL (ref 0.0–1.2)
CO2: 19 mmol/L — ABNORMAL LOW (ref 20–29)
Calcium: 9.3 mg/dL (ref 8.7–10.3)
Chloride: 104 mmol/L (ref 96–106)
Creatinine, Ser: 1.03 mg/dL — ABNORMAL HIGH (ref 0.57–1.00)
Globulin, Total: 2.6 g/dL (ref 1.5–4.5)
Glucose: 154 mg/dL — ABNORMAL HIGH (ref 70–99)
Potassium: 5.3 mmol/L — ABNORMAL HIGH (ref 3.5–5.2)
Sodium: 141 mmol/L (ref 134–144)
Total Protein: 7.3 g/dL (ref 6.0–8.5)
eGFR: 59 mL/min/{1.73_m2} — ABNORMAL LOW (ref >59)

## 2021-05-06 LAB — CBC WITH DIFFERENTIAL/PLATELET
Basophils Absolute: 0 10*3/uL (ref 0.0–0.2)
Basos: 1 %
EOS (ABSOLUTE): 0.2 10*3/uL (ref 0.0–0.4)
Eos: 2 %
Hematocrit: 38.5 % (ref 34.0–46.6)
Hemoglobin: 12.8 g/dL (ref 11.1–15.9)
Immature Grans (Abs): 0 10*3/uL (ref 0.0–0.1)
Immature Granulocytes: 1 %
Lymphocytes Absolute: 1.5 10*3/uL (ref 0.7–3.1)
Lymphs: 19 %
MCH: 30.6 pg (ref 26.6–33.0)
MCHC: 33.2 g/dL (ref 31.5–35.7)
MCV: 92 fL (ref 79–97)
Monocytes Absolute: 0.6 10*3/uL (ref 0.1–0.9)
Monocytes: 7 %
Neutrophils Absolute: 5.6 10*3/uL (ref 1.4–7.0)
Neutrophils: 70 %
Platelets: 233 10*3/uL (ref 150–450)
RBC: 4.18 x10E6/uL (ref 3.77–5.28)
RDW: 12.5 % (ref 11.7–15.4)
WBC: 8 10*3/uL (ref 3.4–10.8)

## 2021-05-06 LAB — LIPID PANEL
Chol/HDL Ratio: 2.7 ratio (ref 0.0–4.4)
Cholesterol, Total: 73 mg/dL — ABNORMAL LOW (ref 100–199)
HDL: 27 mg/dL — ABNORMAL LOW (ref >39)
LDL Chol Calc (NIH): 21 mg/dL (ref 0–99)
Triglycerides: 148 mg/dL (ref 0–149)
VLDL Cholesterol Cal: 25 mg/dL (ref 5–40)

## 2021-05-06 LAB — TSH: TSH: 5.22 u[IU]/mL — ABNORMAL HIGH (ref 0.450–4.500)

## 2021-05-18 ENCOUNTER — Ambulatory Visit (INDEPENDENT_AMBULATORY_CARE_PROVIDER_SITE_OTHER): Payer: Medicare Other

## 2021-05-18 ENCOUNTER — Other Ambulatory Visit: Payer: Self-pay

## 2021-05-18 DIAGNOSIS — Z23 Encounter for immunization: Secondary | ICD-10-CM

## 2021-05-20 ENCOUNTER — Telehealth (HOSPITAL_COMMUNITY): Payer: Self-pay | Admitting: Licensed Clinical Social Worker

## 2021-05-20 NOTE — Telephone Encounter (Signed)
4 boxes of Farxiga left for pt at front desk over a month ago returned to supply.  Jorge Ny, LCSW Clinical Social Worker [facility] Desk#: [phone] Cell#: [phone]

## 2021-06-20 IMAGING — MG MM BREAST BX W/ LOC DEV 1ST LESION IMAGE BX SPEC STEREO GUIDE*R*
8 of 15 series · 8 of 23 positions shown · non-contrast
Comparison: Previous exams.
COMPARISON: Previous exams.

Addendum:
CLINICAL DATA: 69-year-old female presenting for biopsy of right
breast calcifications.

EXAM:
RIGHT BREAST STEREOTACTIC CORE NEEDLE BIOPSY

[R (1 of 8)]
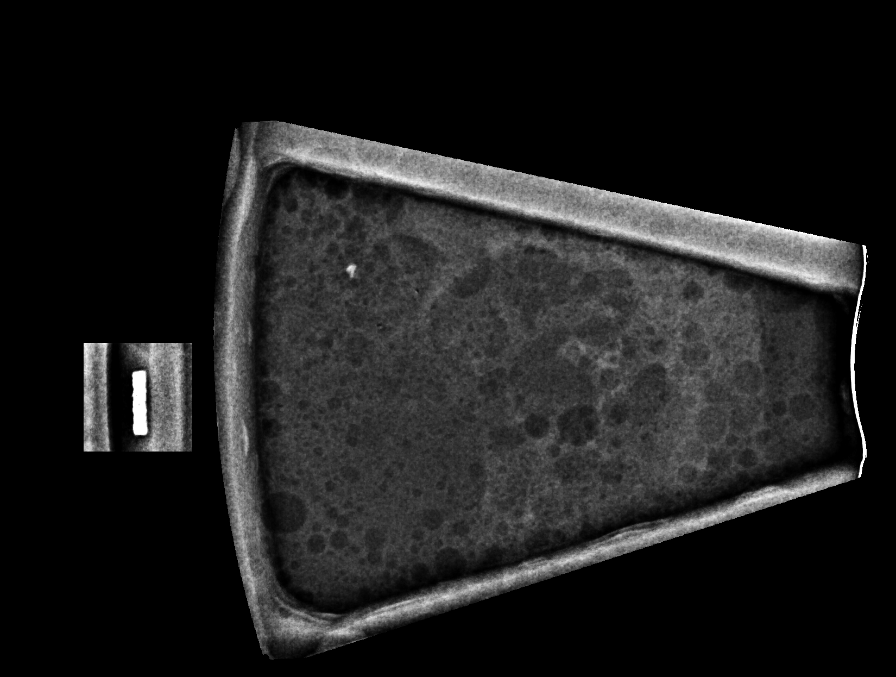

[R (2 of 8)]
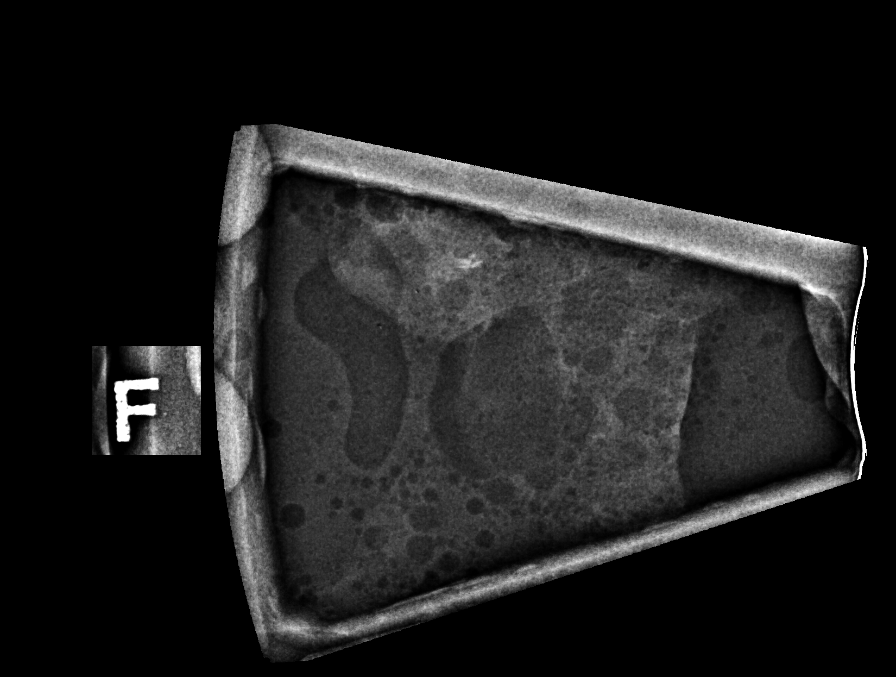

[R (3 of 8)]
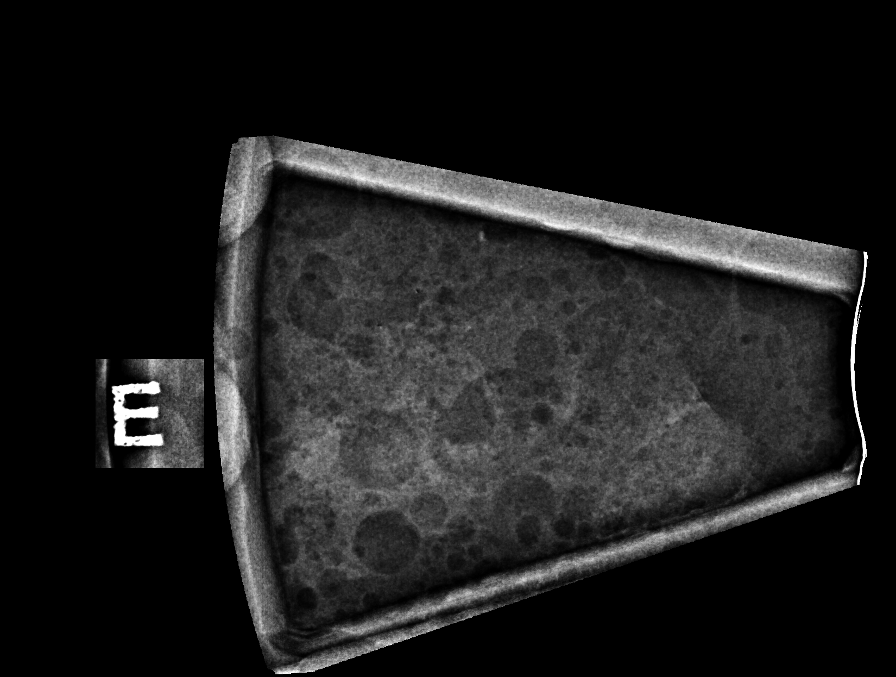

[R (4 of 8)]
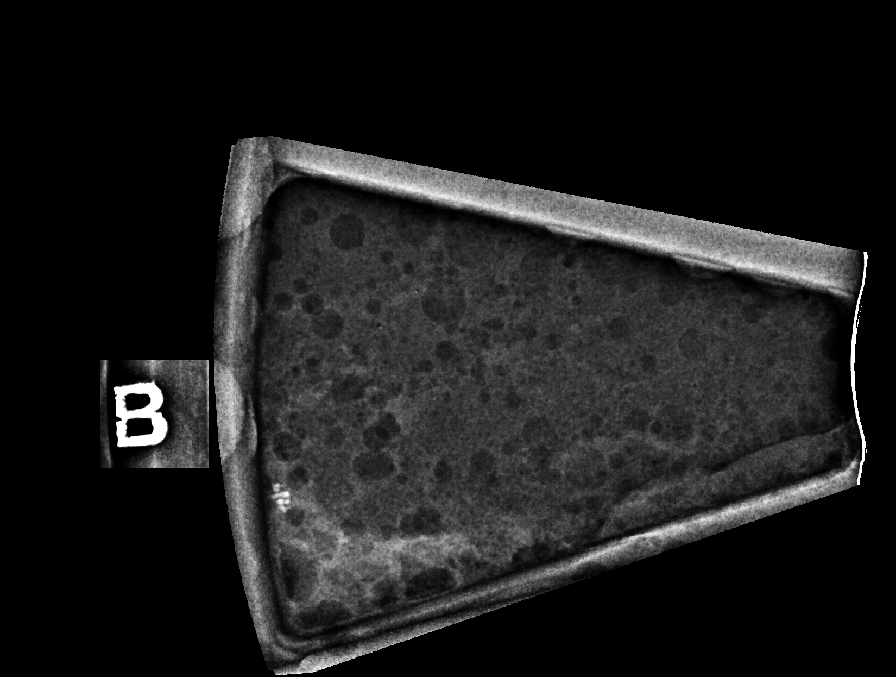

[R (5 of 8)]
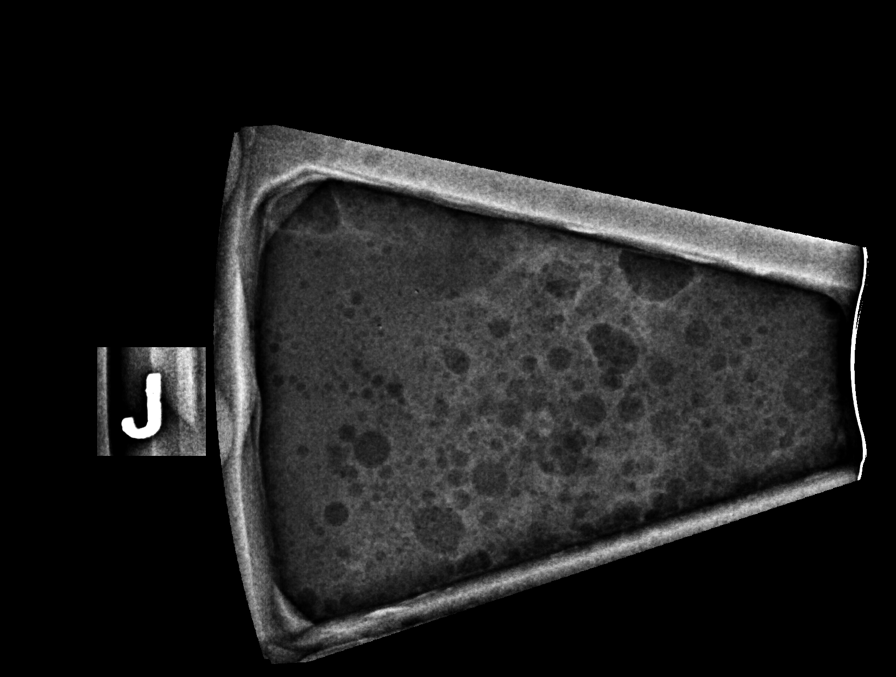

[R (6 of 8)]
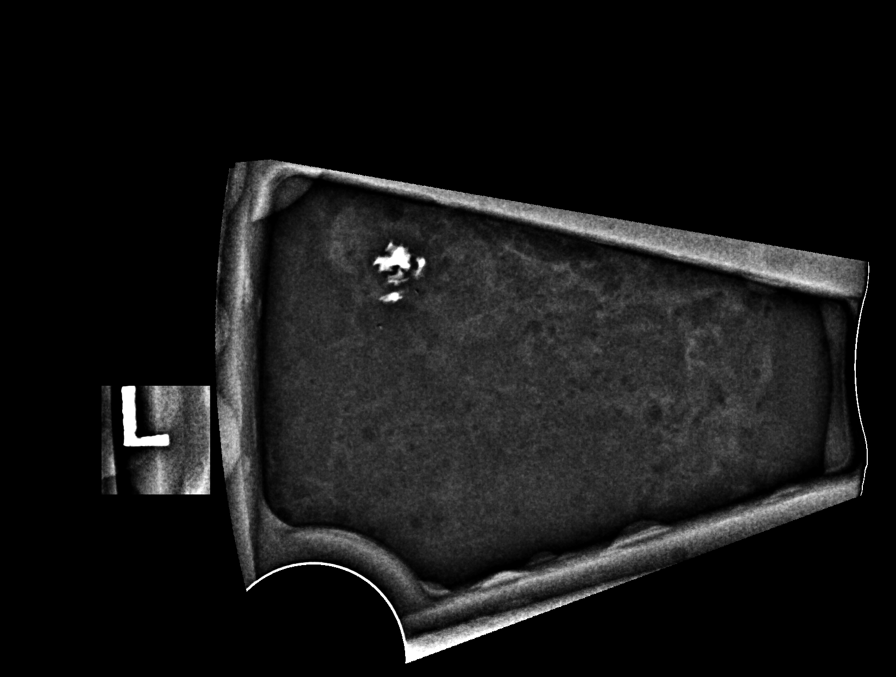

[R (7 of 8)]
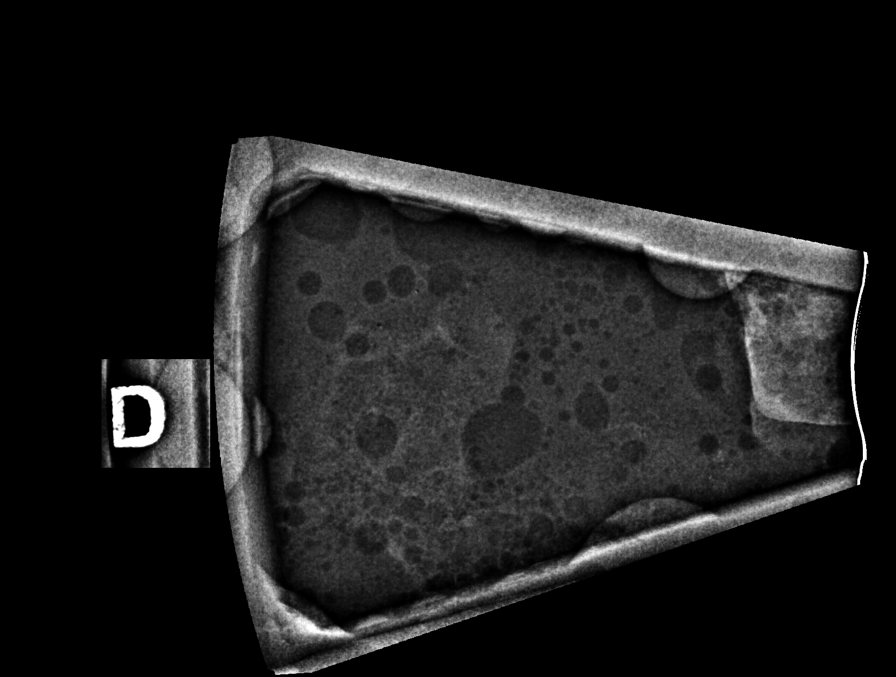

[R (8 of 8)]
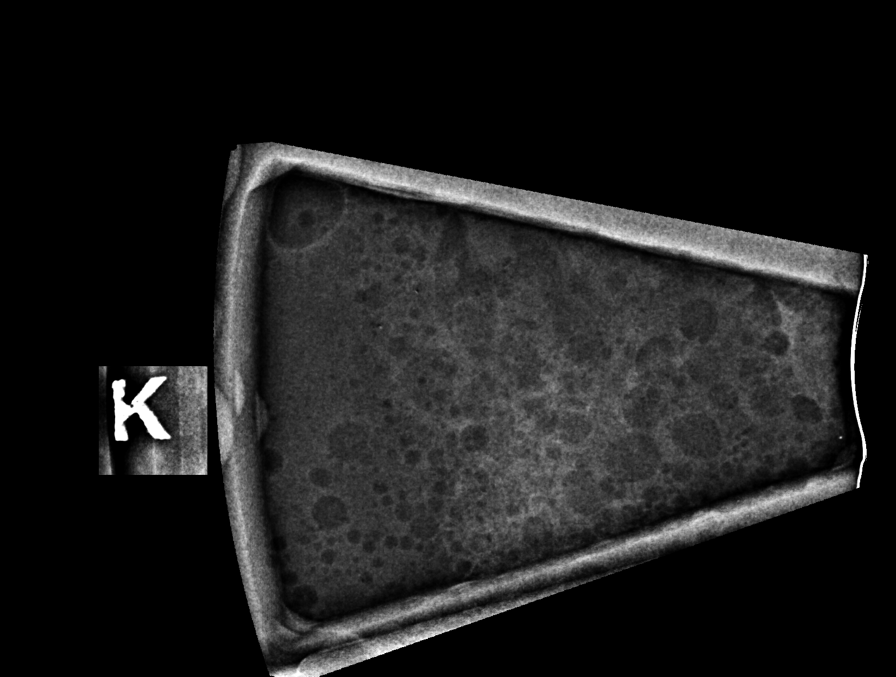

[8 of 23 positions shown; findings below may reference images not displayed]



Using sterile technique and 1% Lidocaine as local anesthetic, under
stereotactic guidance, a 9 gauge vacuum assisted device was used to
perform core needle biopsy of calcifications in the upper outer
right breast using a superior approach. Specimen radiograph was
performed showing at least 5 specimens with calcifications.
Specimens with calcifications are identified for pathology.

Lesion quadrant: Upper outer quadrant

At the conclusion of the procedure, a coil tissue marker clip was
deployed into the biopsy cavity. Follow-up 2-view mammogram was
performed and dictated separately.
IMPRESSION: Stereotactic-guided biopsy of calcifications in the upper outer
right breast. No apparent complications.

ADDENDUM:
Pathology revealed FIBROADENOMA WITH CALCIFICATIONS of the RIGHT
breast, upper outer. This was found to be concordant by Dr. Osuba
Tiger.

Pathology results were discussed with the patient by telephone. The
patient reported doing well after the biopsy with tenderness at the
site. Post biopsy instructions and care were reviewed and questions
were answered. The patient was encouraged to call The [REDACTED] for any additional concerns. My direct phone
number was provided.

The patient was instructed to return for annual screening
mammography, due in June 2021.

Pathology results reported by Deanne Tiger, RN on 08/18/2020.



Using sterile technique and 1% Lidocaine as local anesthetic, under
stereotactic guidance, a 9 gauge vacuum assisted device was used to
perform core needle biopsy of calcifications in the upper outer
right breast using a superior approach. Specimen radiograph was
performed showing at least 5 specimens with calcifications.
Specimens with calcifications are identified for pathology.

Lesion quadrant: Upper outer quadrant

At the conclusion of the procedure, a coil tissue marker clip was
deployed into the biopsy cavity. Follow-up 2-view mammogram was
performed and dictated separately.
IMPRESSION: Stereotactic-guided biopsy of calcifications in the upper outer
right breast. No apparent complications.

## 2021-07-07 ENCOUNTER — Encounter (HOSPITAL_COMMUNITY): Payer: Self-pay | Admitting: Cardiology

## 2021-07-15 ENCOUNTER — Ambulatory Visit (INDEPENDENT_AMBULATORY_CARE_PROVIDER_SITE_OTHER): Payer: Medicare Other

## 2021-07-15 VITALS — Ht 64.0 in | Wt 160.0 lb

## 2021-07-15 DIAGNOSIS — Z0000 Encounter for general adult medical examination without abnormal findings: Secondary | ICD-10-CM

## 2021-07-15 DIAGNOSIS — Z1231 Encounter for screening mammogram for malignant neoplasm of breast: Secondary | ICD-10-CM

## 2021-07-15 NOTE — Patient Instructions (Signed)
Becky Boyle , Thank you for taking time to come for your Medicare Wellness Visit. I appreciate your ongoing commitment to your health goals. Please review the following plan we discussed and let me know if I can assist you in the future.   Screening recommendations/referrals: Colonoscopy: Cologuard done 10/19/2020 - Repeat every 3 years Mammogram: Done 08/13/2020 - Repeat annually *ordered today Bone Density: Due - every 2-5 years Recommended yearly ophthalmology/optometry visit for glaucoma screening and checkup Recommended yearly dental visit for hygiene and checkup  Vaccinations: Influenza vaccine: Done 05/18/2021 - Repeat annually  Pneumococcal vaccine: Done 04/13/2020 - Ask about Pneumovax Tdap vaccine: Due Shingles vaccine: Timmothy Sours 04/13/2020 - Get second dose at next visit   Covid-19: Done 11/04/2019, 12/02/2019, & 06/15/2020  Advanced directives: Advance directive discussed with you today. Even though you declined this today, please call our office should you change your mind, and we can give you the proper paperwork for you to fill out.   Conditions/risks identified: Aim for 30 minutes of exercise or brisk walking each day, drink 6-8 glasses of water and eat lots of fruits and vegetables.   Next appointment: Follow up in one year for your annual wellness visit    Preventive Care 65 Years and Older, Female Preventive care refers to lifestyle choices and visits with your health care provider that can promote health and wellness. What does preventive care include? A yearly physical exam. This is also called an annual well check. Dental exams once or twice a year. Routine eye exams. Ask your health care provider how often you should have your eyes checked. Personal lifestyle choices, including: Daily care of your teeth and gums. Regular physical activity. Eating a healthy diet. Avoiding tobacco and drug use. Limiting alcohol use. Practicing safe sex. Taking low-dose aspirin every  day. Taking vitamin and mineral supplements as recommended by your health care provider. What happens during an annual well check? The services and screenings done by your health care provider during your annual well check will depend on your age, overall health, lifestyle risk factors, and family history of disease. Counseling  Your health care provider may ask you questions about your: Alcohol use. Tobacco use. Drug use. Emotional well-being. Home and relationship well-being. Sexual activity. Eating habits. History of falls. Memory and ability to understand (cognition). Work and work Statistician. Reproductive health. Screening  You may have the following tests or measurements: Height, weight, and BMI. Blood pressure. Lipid and cholesterol levels. These may be checked every 5 years, or more frequently if you are over 59 years old. Skin check. Lung cancer screening. You may have this screening every year starting at age 77 if you have a 30-pack-year history of smoking and currently smoke or have quit within the past 15 years. Fecal occult blood test (FOBT) of the stool. You may have this test every year starting at age 8. Flexible sigmoidoscopy or colonoscopy. You may have a sigmoidoscopy every 5 years or a colonoscopy every 10 years starting at age 73. Hepatitis C blood test. Hepatitis B blood test. Sexually transmitted disease (STD) testing. Diabetes screening. This is done by checking your blood sugar (glucose) after you have not eaten for a while (fasting). You may have this done every 1-3 years. Bone density scan. This is done to screen for osteoporosis. You may have this done starting at age 52. Mammogram. This may be done every 1-2 years. Talk to your health care provider about how often you should have regular mammograms. Talk with your  health care provider about your test results, treatment options, and if necessary, the need for more tests. Vaccines  Your health care  provider may recommend certain vaccines, such as: Influenza vaccine. This is recommended every year. Tetanus, diphtheria, and acellular pertussis (Tdap, Td) vaccine. You may need a Td booster every 10 years. Zoster vaccine. You may need this after age 59. Pneumococcal 13-valent conjugate (PCV13) vaccine. One dose is recommended after age 67. Pneumococcal polysaccharide (PPSV23) vaccine. One dose is recommended after age 24. Talk to your health care provider about which screenings and vaccines you need and how often you need them. This information is not intended to replace advice given to you by your health care provider. Make sure you discuss any questions you have with your health care provider. Document Released: 08/13/2015 Document Revised: 04/05/2016 Document Reviewed: 05/18/2015 Elsevier Interactive Patient Education  2017 [place] Prevention in the Home Falls can cause injuries. They can happen to people of all ages. There are many things you can do to make your home safe and to help prevent falls. What can I do on the outside of my home? Regularly fix the edges of walkways and driveways and fix any cracks. Remove anything that might make you trip as you walk through a door, such as a raised step or threshold. Trim any bushes or trees on the path to your home. Use bright outdoor lighting. Clear any walking paths of anything that might make someone trip, such as rocks or tools. Regularly check to see if handrails are loose or broken. Make sure that both sides of any steps have handrails. Any raised decks and porches should have guardrails on the edges. Have any leaves, snow, or ice cleared regularly. Use sand or salt on walking paths during winter. Clean up any spills in your garage right away. This includes oil or grease spills. What can I do in the bathroom? Use night lights. Install grab bars by the toilet and in the tub and shower. Do not use towel bars as grab  bars. Use non-skid mats or decals in the tub or shower. If you need to sit down in the shower, use a plastic, non-slip stool. Keep the floor dry. Clean up any water that spills on the floor as soon as it happens. Remove soap buildup in the tub or shower regularly. Attach bath mats securely with double-sided non-slip rug tape. Do not have throw rugs and other things on the floor that can make you trip. What can I do in the bedroom? Use night lights. Make sure that you have a light by your bed that is easy to reach. Do not use any sheets or blankets that are too big for your bed. They should not hang down onto the floor. Have a firm chair that has side arms. You can use this for support while you get dressed. Do not have throw rugs and other things on the floor that can make you trip. What can I do in the kitchen? Clean up any spills right away. Avoid walking on wet floors. Keep items that you use a lot in easy-to-reach places. If you need to reach something above you, use a strong step stool that has a grab bar. Keep electrical cords out of the way. Do not use floor polish or wax that makes floors slippery. If you must use wax, use non-skid floor wax. Do not have throw rugs and other things on the floor that can make you trip. What  can I do with my stairs? Do not leave any items on the stairs. Make sure that there are handrails on both sides of the stairs and use them. Fix handrails that are broken or loose. Make sure that handrails are as long as the stairways. Check any carpeting to make sure that it is firmly attached to the stairs. Fix any carpet that is loose or worn. Avoid having throw rugs at the top or bottom of the stairs. If you do have throw rugs, attach them to the floor with carpet tape. Make sure that you have a light switch at the top of the stairs and the bottom of the stairs. If you do not have them, ask someone to add them for you. What else can I do to help prevent  falls? Wear shoes that: Do not have high heels. Have rubber bottoms. Are comfortable and fit you well. Are closed at the toe. Do not wear sandals. If you use a stepladder: Make sure that it is fully opened. Do not climb a closed stepladder. Make sure that both sides of the stepladder are locked into place. Ask someone to hold it for you, if possible. Clearly mark and make sure that you can see: Any grab bars or handrails. First and last steps. Where the edge of each step is. Use tools that help you move around (mobility aids) if they are needed. These include: Canes. Walkers. Scooters. Crutches. Turn on the lights when you go into a dark area. Replace any light bulbs as soon as they burn out. Set up your furniture so you have a clear path. Avoid moving your furniture around. If any of your floors are uneven, fix them. If there are any pets around you, be aware of where they are. Review your medicines with your doctor. Some medicines can make you feel dizzy. This can increase your chance of falling. Ask your doctor what other things that you can do to help prevent falls. This information is not intended to replace advice given to you by your health care provider. Make sure you discuss any questions you have with your health care provider. Document Released: 05/13/2009 Document Revised: 12/23/2015 Document Reviewed: 08/21/2014 Elsevier Interactive Patient Education  2017 [employer].

## 2021-07-15 NOTE — Progress Notes (Signed)
Subjective:   Becky Boyle is a 70 y.o. female who presents for Medicare Annual (Subsequent) preventive examination.  Virtual Visit via Telephone Note  I connected with  Becky Boyle on 07/15/21 at  9:00 AM EST by telephone and verified that I am speaking with the correct person using two identifiers.  Location: Patient: Home Provider: [facility] Persons participating in the virtual visit: patient/Nurse Health Advisor   I discussed the limitations, risks, security and privacy concerns of performing an evaluation and management service by telephone and the availability of in person appointments. The patient expressed understanding and agreed to proceed.  Interactive audio and video telecommunications were attempted between this nurse and patient, however failed, due to patient having technical difficulties OR patient did not have access to video capability.  We continued and completed visit with audio only.  Some vital signs may be absent or patient reported.   Naomi Fitton E Anadelia Kintz, LPN   Review of Systems     Cardiac Risk Factors include: advanced age (>43men, >74 women);diabetes mellitus;dyslipidemia;hypertension;sedentary lifestyle;Other (see comment), Risk factor comments: hx of MI, chronic heart failure     Objective:    Today's Vitals   07/15/21 0909  Weight: 160 lb (72.6 kg)  Height: 5\' 4"  (1.626 m)   Body mass index is 27.46 kg/m.  Advanced Directives 07/15/2021 07/13/2020 03/24/2016  Does Patient Have a Medical Advance Directive? No No No  Would patient like information on creating a medical advance directive? No - Patient declined No - Patient declined No - patient declined information    Current Medications (verified) Outpatient Encounter Medications as of 07/15/2021  Medication Sig   atorvastatin (LIPITOR) 80 MG tablet Take 1 tablet (80 mg total) by mouth at bedtime.   carvedilol (COREG) 12.5 MG tablet Take 1 tablet (12.5 mg total) by mouth 2 (two) times daily with a  meal.   cetirizine (ZYRTEC) 10 MG tablet Take 10 mg by mouth as needed for allergies.   dapagliflozin propanediol (FARXIGA) 10 MG TABS tablet Take 1 tablet (10 mg total) by mouth daily before breakfast.   furosemide (LASIX) 20 MG tablet Take 1 tablet (20 mg total) by mouth daily as needed.   Lancets (ONETOUCH DELICA PLUS [id number]) MISC Apply 1 each topically daily.   metFORMIN (GLUCOPHAGE) 500 MG tablet Take 1 tablet (500 mg total) by mouth 2 (two) times daily with a meal.   nitroGLYCERIN (NITROSTAT) 0.4 MG SL tablet Place 1 tablet (0.4 mg total) under the tongue every 5 (five) minutes x 3 doses as needed for chest pain.   ONETOUCH VERIO test strip CHECK BLOOD SUGAR ONCE DAILY OR AS DIRECTED   sacubitril-valsartan (ENTRESTO) 49-51 MG Take 1 tablet by mouth 2 (two) times daily.   sodium chloride (OCEAN) 0.65 % SOLN nasal spray Place 1 spray into both nostrils as needed for congestion.   spironolactone (ALDACTONE) 25 MG tablet Take 1 tablet (25 mg total) by mouth daily.   ticagrelor (BRILINTA) 90 MG TABS tablet Take 1 tablet (90 mg total) by mouth 2 (two) times daily.   No facility-administered encounter medications on file as of 07/15/2021.    Allergies (verified) Latex, Penicillins, Codeine, and Tape   History: 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HCC)    CKD (chronic kidney disease) stage 3, GFR 30-59 ml/min (HCC)    Depression    Diabetes ([place])    Heart failure ([place])  History of non-ST elevation myocardial infarction (NSTEMI)    Past Surgical History:  Procedure Laterality Date   ABDOMINAL HYSTERECTOMY     BRAIN SURGERY  1989   tumor    CORONARY STENT INTERVENTION N/A 02/17/2020   Procedure: CORONARY STENT INTERVENTION;  Surgeon: Jettie Booze, MD;  Location: [facility];  Service: Cardiovascular;  Laterality: N/A;   RIGHT/LEFT HEART CATH AND CORONARY ANGIOGRAPHY N/A 02/17/2020   Procedure: RIGHT/LEFT HEART  CATH AND CORONARY ANGIOGRAPHY;  Surgeon: Jettie Booze, MD;  Location: [facility];  Service: Cardiovascular;  Laterality: N/A;   Family History  Problem Relation Age of Onset   Alzheimer's disease Mother    Cancer Father    Diabetes Son    High blood pressure Son    Social History   Socioeconomic History   Marital status: Married    Spouse name: Not on file   Number of children: 1   Years of education: Not on file   Highest education level: Not on file  Occupational History   Occupation: retired  Tobacco Use   Smoking status: Never   Smokeless tobacco: Never  Substance and Sexual Activity   Alcohol use: No   Drug use: No   Sexual activity: Not on file  Other Topics Concern   Not on file  Social History Narrative   Son living with them right now   Social Determinants of Health   Financial Resource Strain: Medium Risk   Difficulty of Paying Living Expenses: Somewhat hard  Food Insecurity: Landscape architect Present   Worried About Charity fundraiser in the Last Year: Often true   Arboriculturist in the Last Year: Sometimes true  Transportation Needs: No Transportation Needs   Lack of Transportation (Medical): No   Lack of Transportation (Non-Medical): No  Physical Activity: Insufficiently Active   Days of Exercise per Week: 5 days   Minutes of Exercise per Session: 10 min  Stress: Stress Concern Present   Feeling of Stress : To some extent  Social Connections: Engineer, building services of Communication with Friends and Family: Twice a week   Frequency of Social Gatherings with Friends and Family: Twice a week   Attends Religious Services: More than 4 times per year   Active Member of [employer] or Organizations: Yes   Attends Archivist Meetings: 1 to 4 times per year   Marital Status: Married    Tobacco Counseling Counseling given: Not Answered   Clinical Intake:  Pre-visit preparation completed: Yes  Pain : No/denies pain      BMI - recorded: 27.46 Nutritional Status: BMI 25 -29 Overweight Nutritional Risks: None Diabetes: Yes CBG done?: No Did pt. bring in CBG monitor from home?: No  How often do you need to have someone help you when you read instructions, pamphlets, or other written materials from your doctor or pharmacy?: 2 - Rarely  Diabetic? Nutrition Risk Assessment:  Has the patient had any N/V/D within the last 2 months?  No  Does the patient have any non-healing wounds?  No  Has the patient had any unintentional weight loss or weight gain?  No   Diabetes:  Is the patient diabetic?  Yes  If diabetic, was a CBG obtained today?  No  Did the patient bring in their glucometer from home?  No  How often do you monitor your CBG's? Once daily.   Financial Strains and Diabetes Management:  Are you having any  financial strains with the device, your supplies or your medication?  Yes, but already getting assistance .  Does the patient want to be seen by Chronic Care Management for management of their diabetes?  No  Would the patient like to be referred to a Nutritionist or for Diabetic Management?  No   Diabetic Exams:  Diabetic Eye Exam: Completed 07/2020. Has appt next month  Diabetic Foot Exam: Completed 09/22/2020. Pt has been advised about the importance in completing this exam. Pt is scheduled for diabetic foot exam on next year.    Interpreter Needed?: No  Information entered by :: Zyniah Ferraiolo, LPN   Activities of Daily Living In your present state of health, do you have any difficulty performing the following activities: 07/15/2021 07/15/2021  Hearing? N N  Vision? N N  Difficulty concentrating or making decisions? N N  Walking or climbing stairs? Y Y  Dressing or bathing? N N  Doing errands, shopping? N N  Preparing Food and eating ? N N  Using the Toilet? N N  In the past six months, have you accidently leaked urine? N N  Do you have problems with loss of bowel control? N N   Managing your Medications? N N  Managing your Finances? N N  Housekeeping or managing your Housekeeping? N N  Some recent data might be hidden    Patient Care Team: [name], FNP as PCP - General (Family Medicine) Larey Dresser, MD as Consulting Physician (Cardiology)  Indicate any recent Medical Services you may have received from other than [facility] providers in the past year (date may be approximate).     Assessment:   This is a routine wellness examination for Becky Boyle.  Hearing/Vision screen Hearing Screening - Comments:: Denies hearing difficulties  Vision Screening - Comments:: Wears readers prn only - up to date with annual eye exams with [facility] [place] issues and exercise activities discussed: Current Exercise Habits: The patient does not participate in regular exercise at present, Exercise limited by: psychological condition(s);cardiac condition(s)   Goals Addressed             This Visit's Progress    Patient Stated   Not on track    07/13/2020 AWV Goal: Exercise for General Health  Patient will verbalize understanding of the benefits of increased physical activity: Exercising regularly is important. It will improve your overall fitness, flexibility, and endurance. Regular exercise also will improve your overall health. It can help you control your weight, reduce stress, and improve your bone density. Over the next year, patient will increase physical activity as tolerated with a goal of at least 150 minutes of moderate physical activity per week.  You can tell that you are exercising at a moderate intensity if your heart starts beating faster and you start breathing faster but can still hold a conversation. Moderate-intensity exercise ideas include: Walking 1 mile (1.6 km) in about 15 minutes Biking Hiking Golfing Dancing Water aerobics Patient will verbalize understanding of everyday activities that increase physical  activity by providing examples like the following: Yard work, such as: Sales promotion account executive Gardening Washing windows or floors Patient will be able to explain general safety guidelines for exercising:  Before you start a new exercise program, talk with your health care provider. Do not exercise so much that you hurt yourself, feel dizzy, or get very short of breath. Wear  comfortable clothes and wear shoes with good support. Drink plenty of water while you exercise to prevent dehydration or heat stroke. Work out until your breathing and your heartbeat get faster.        Depression Screen PHQ 2/9 Scores 07/15/2021 05/05/2021 09/22/2020 07/13/2020 03/30/2020 03/22/2020 03/22/2020  PHQ - 2 Score 1 0 4 1 0 0 0  PHQ- 9 Score 4 5 16  - - 0 -    Fall Risk Fall Risk  07/15/2021 07/15/2021 05/05/2021 09/22/2020 07/13/2020  Falls in the past year? 1 1 0 0 0  Number falls in past yr: 1 1 - - -  Injury with Fall? 1 1 - - -  Risk for fall due to : History of fall(s);Other (Comment) - - - -  Risk for fall due to: Comment she has blacked out twice in the last few months - refused ER and PCP had no openings - she declines appt right now - will call if this occurs again - - - -  Follow up Education provided;Falls prevention discussed - - - Falls evaluation completed    FALL RISK PREVENTION PERTAINING TO THE HOME:  Any stairs in or around the home? No  If so, are there any without handrails? No  Home free of loose throw rugs in walkways, pet beds, electrical cords, etc? Yes  Adequate lighting in your home to reduce risk of falls? Yes   ASSISTIVE DEVICES UTILIZED TO PREVENT FALLS:  Life alert? No  Use of a cane, walker or w/c? No  Grab bars in the bathroom? Yes  Shower chair or bench in shower? Yes  Elevated toilet seat or a handicapped toilet? No   TIMED UP AND GO:  Was the test performed? No . Telephonic  visit  Cognitive Function:     6CIT Screen 07/15/2021 07/13/2020  What Year? 0 points 0 points  What month? 0 points 0 points  What time? 0 points 0 points  Count back from 20 0 points 0 points  Months in reverse 0 points 0 points  Repeat phrase 0 points 0 points  Total Score 0 0    Immunizations Immunization History  Administered Date(s) Administered   Fluad Quad(high Dose 65+) 06/10/2020, 05/18/2021   Moderna Sars-Covid-2 Vaccination 11/04/2019, 12/02/2019, 06/15/2020   Pneumococcal Conjugate-13 04/13/2020   Zoster Recombinat (Shingrix) 04/13/2020    TDAP status: Due, Education has been provided regarding the importance of this vaccine. Advised may receive this vaccine at local pharmacy or Health Dept. Aware to provide a copy of the vaccination record if obtained from local pharmacy or Health Dept. Verbalized acceptance and understanding.  Flu Vaccine status: Up to date  Pneumococcal vaccine status: Due, Education has been provided regarding the importance of this vaccine. Advised may receive this vaccine at local pharmacy or Health Dept. Aware to provide a copy of the vaccination record if obtained from local pharmacy or Health Dept. Verbalized acceptance and understanding.  Covid-19 vaccine status: Completed vaccines  Qualifies for Shingles Vaccine? Yes   Zostavax completed Yes   Shingrix Completed?: No.    Education has been provided regarding the importance of this vaccine. Patient has been advised to call insurance company to determine out of pocket expense if they have not yet received this vaccine. Advised may also receive vaccine at local pharmacy or Health Dept. Verbalized acceptance and understanding.  Screening Tests Health Maintenance  Topic Date Due   OPHTHALMOLOGY EXAM  Never done   COVID-19 Vaccine (4 - Booster for [employer]  series) 08/10/2020   Pneumonia Vaccine 7+ Years old (2 - PPSV23 if available, else PCV20) 04/13/2021   Zoster Vaccines- Shingrix (2 of  2) 08/05/2021 (Originally 06/08/2020)   DEXA SCAN  09/22/2021 (Originally [birth year])   TETANUS/TDAP  09/22/2021 (Originally 08/23/1969)   Hepatitis C Screening  09/22/2021 (Originally 08/23/1968)   MAMMOGRAM  08/05/2021   FOOT EXAM  09/22/2021   HEMOGLOBIN A1C  11/03/2021   Fecal DNA (Cologuard)  10/20/2023   INFLUENZA VACCINE  Completed   HPV VACCINES  Aged Out    Health Maintenance  Health Maintenance Due  Topic Date Due   OPHTHALMOLOGY EXAM  Never done   COVID-19 Vaccine (4 - Booster for Moderna series) 08/10/2020   Pneumonia Vaccine 33+ Years old (2 - PPSV23 if available, else PCV20) 04/13/2021    Colorectal cancer screening: Type of screening: Cologuard. Completed 10/19/2020. Repeat every 3 years  Mammogram status: Completed 08/13/2020. Repeat every year  Bone Density status: Ordered 07/15/21. Pt provided with contact info and advised to call to schedule appt.  Lung Cancer Screening: (Low Dose CT Chest recommended if Age 71-80 years, 30 pack-year currently smoking OR have quit w/in 15years.) does not qualify.   Additional Screening:  Hepatitis C Screening: does qualify; Due  Vision Screening: Recommended annual ophthalmology exams for early detection of glaucoma and other disorders of the eye. Is the patient up to date with their annual eye exam?  Yes  Who is the provider or what is the name of the office in which the patient attends annual eye exams? [facility] If pt is not established with a provider, would they like to be referred to a provider to establish care? No .   Dental Screening: Recommended annual dental exams for proper oral hygiene  Community Resource Referral / Chronic Care Management: CRR required this visit?  No   CCM required this visit?  No      Plan:     I have personally reviewed and noted the following in the patients chart:   Medical and social history Use of alcohol, tobacco or illicit drugs  Current  medications and supplements including opioid prescriptions.  Functional ability and status Nutritional status Physical activity Advanced directives List of other physicians Hospitalizations, surgeries, and ER visits in previous 12 months Vitals Screenings to include cognitive, depression, and falls Referrals and appointments  In addition, I have reviewed and discussed with patient certain preventive protocols, quality metrics, and best practice recommendations. A written personalized care plan for preventive services as well as general preventive health recommendations were provided to patient.     Sandrea Hammond, LPN   [id number]   Nurse Notes: She blacked out a few weeks ago, fell and hurt herself pretty bad - didn't feel it was an emergency, but wanted to get checked out. She called [facility] and was told we had no openings and to go to the ER - she is very upset about this. She wanted me to let you know. She has had 2 black out spells in the last few months - if it occurs again, she will call us and hopes we can fit her in. I offered appt, but she declined.

## 2021-07-26 ENCOUNTER — Other Ambulatory Visit (HOSPITAL_COMMUNITY): Payer: Self-pay | Admitting: Cardiology

## 2021-08-01 DIAGNOSIS — H524 Presbyopia: Secondary | ICD-10-CM | POA: Diagnosis not present

## 2021-08-01 LAB — HM DIABETES EYE EXAM

## 2021-08-08 ENCOUNTER — Other Ambulatory Visit: Payer: Self-pay

## 2021-08-08 ENCOUNTER — Ambulatory Visit (HOSPITAL_COMMUNITY)
Admission: RE | Admit: 2021-08-08 | Discharge: 2021-08-08 | Disposition: A | Discharge status: Home / Self Care | Payer: No Typology Code available for payment source | Source: Ambulatory Visit | Attending: Cardiology | Admitting: Cardiology

## 2021-08-08 ENCOUNTER — Encounter (HOSPITAL_COMMUNITY): Payer: Self-pay | Admitting: Cardiology

## 2021-08-08 ENCOUNTER — Other Ambulatory Visit (HOSPITAL_COMMUNITY): Payer: Self-pay | Admitting: Cardiology

## 2021-08-08 VITALS — BP 120/62 | HR 86 | Ht 64.0 in | Wt 165.0 lb

## 2021-08-08 DIAGNOSIS — Z596 Low income: Secondary | ICD-10-CM | POA: Diagnosis not present

## 2021-08-08 DIAGNOSIS — Z5941 Food insecurity: Secondary | ICD-10-CM | POA: Diagnosis not present

## 2021-08-08 DIAGNOSIS — Z955 Presence of coronary angioplasty implant and graft: Secondary | ICD-10-CM | POA: Insufficient documentation

## 2021-08-08 DIAGNOSIS — Z79899 Other long term (current) drug therapy: Secondary | ICD-10-CM | POA: Insufficient documentation

## 2021-08-08 DIAGNOSIS — R55 Syncope and collapse: Secondary | ICD-10-CM

## 2021-08-08 DIAGNOSIS — I5042 Chronic combined systolic (congestive) and diastolic (congestive) heart failure: Secondary | ICD-10-CM | POA: Insufficient documentation

## 2021-08-08 DIAGNOSIS — Z7902 Long term (current) use of antithrombotics/antiplatelets: Secondary | ICD-10-CM | POA: Diagnosis not present

## 2021-08-08 DIAGNOSIS — I2510 Atherosclerotic heart disease of native coronary artery without angina pectoris: Secondary | ICD-10-CM | POA: Diagnosis not present

## 2021-08-08 DIAGNOSIS — E119 Type 2 diabetes mellitus without complications: Secondary | ICD-10-CM | POA: Diagnosis not present

## 2021-08-08 DIAGNOSIS — Z7984 Long term (current) use of oral hypoglycemic drugs: Secondary | ICD-10-CM | POA: Insufficient documentation

## 2021-08-08 LAB — CBC
HCT: 40.1 % (ref 36.0–46.0)
Hemoglobin: 12.7 g/dL (ref 12.0–15.0)
MCH: 29.6 pg (ref 26.0–34.0)
MCHC: 31.7 g/dL (ref 30.0–36.0)
MCV: 93.5 fL (ref 80.0–100.0)
Platelets: 253 10*3/uL (ref 150–400)
RBC: 4.29 MIL/uL (ref 3.87–5.11)
RDW: 13 % (ref 11.5–15.5)
WBC: 9.3 10*3/uL (ref 4.0–10.5)
nRBC: 0 % (ref 0.0–0.2)

## 2021-08-08 LAB — COMPREHENSIVE METABOLIC PANEL
ALT: 17 U/L (ref 0–44)
AST: 17 U/L (ref 15–41)
Albumin: 4.4 g/dL (ref 3.5–5.0)
Alkaline Phosphatase: 93 U/L (ref 38–126)
Anion gap: 11 (ref 5–15)
BUN: 18 mg/dL (ref 8–23)
CO2: 25 mmol/L (ref 22–32)
Calcium: 9.3 mg/dL (ref 8.9–10.3)
Chloride: 105 mmol/L (ref 98–111)
Creatinine, Ser: 1.06 mg/dL — ABNORMAL HIGH (ref 0.44–1.00)
GFR, Estimated: 57 mL/min — ABNORMAL LOW (ref >60)
Glucose, Bld: 144 mg/dL — ABNORMAL HIGH (ref 70–99)
Potassium: 4.5 mmol/L (ref 3.5–5.1)
Sodium: 141 mmol/L (ref 135–145)
Total Bilirubin: 1 mg/dL (ref 0.3–1.2)
Total Protein: 7.8 g/dL (ref 6.5–8.1)

## 2021-08-08 LAB — TSH: TSH: 5.579 u[IU]/mL — ABNORMAL HIGH (ref 0.350–4.500)

## 2021-08-08 MED ORDER — CLOPIDOGREL BISULFATE 75 MG PO TABS: 75.0000 mg | ORAL_TABLET | Freq: Every day | ORAL | 6 refills | Status: DC

## 2021-08-08 NOTE — Progress Notes (Signed)
PCP: [name], FNP Cardiology: Dr. Aundra Dubin  71 y.o. with history of NSTEMI and ischemic cardiomyopathy presents for followup of CHF and CAD.  Patient was admitted in 7/21 with NSTEMI.  Cath showed LAD culprit, patient had DES x 2 to proximal LAD.  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 Echo was done showing EF 25-30%, septal akinesis. Patient was started on cardiac meds and was discharged home with a Lifevest. A new diagnosis of diabetes was made and she was started on metformin.   Echo in 10/21 showed EF up to 55-60% with normal RV.   She returns for followup of CHF and CAD.  She has had no chest pain.  She gets fatigued walking up steep stairs but no significant exertional dyspnea. No problems walking on flat ground.  In 10/22, she passed out twice with no prodrome, a couple days apart.  The first day, she was doing yardwork, episode seen by her husband.  The second time she was in the car.  She called her PCP who apparently advised her to go to the ER, but she did not go. She has had no episodes of lightheadedness or syncope since 10/22.   Labs (7/21): K 4.1, creatinine 0.87 => 0.98 Labs (9/21): BNP 59, K 4.1, creatinine 1.2, LDL 37, TGs 197, HDL 27 Labs (10/22): K 5.3, creatinine 1.03, LDL 21, TGs 148  ECG (personally reviewed): NSR, poor RWP  PMH: 1. Type 2 diabetes: New diagnosis in 7/21.  2. Hyperlipidemia 3. Chronic systolic CHF: Ischemic cardiomyopathy.   - Echo (7/21): EF 25-30%, septal akinesis.   - Echo (10/21): EF 55-60%, normal RV, mild MR.  4. CAD: NSTEMI in 7/21.  - LHC: DES x 2 to proximal LAD, CTO RCA with collaterals, moderate PLOM stenosis.  5. Syncope: x 2 in 10/22.   Social History   Socioeconomic History   Marital status: Married    Spouse name: Not on file   Number of children: 1   Years of education: Not on file   Highest education level: Not on file  Occupational History   Occupation: retired  Tobacco  Use   Smoking status: Never   Smokeless tobacco: Never  Substance and Sexual Activity   Alcohol use: No   Drug use: No   Sexual activity: Not on file  Other Topics Concern   Not on file  Social History Narrative   Son living with them right now   Social Determinants of Health   Financial Resource Strain: Medium Risk   Difficulty of Paying Living Expenses: Somewhat hard  Food Insecurity: Landscape architect Present   Worried About Charity fundraiser in the Last Year: Often true   Arboriculturist in the Last Year: Sometimes true  Transportation Needs: No Transportation Needs   Lack of Transportation (Medical): No   Lack of Transportation (Non-Medical): No  Physical Activity: Insufficiently Active   Days of Exercise per Week: 5 days   Minutes of Exercise per Session: 10 min  Stress: Stress Concern Present   Feeling of Stress : To some extent  Social Connections: Engineer, building services of Communication with Friends and Family: Twice a week   Frequency of Social Gatherings with Friends and Family: Twice a week   Attends Religious Services: More than 4 times per year   Active Member of [employer] or Organizations: Yes   Attends Archivist Meetings: 1 to 4 times per year  Marital Status: Married  Human resources officer Violence: Not At Risk   Fear of Current or Ex-Partner: No   Emotionally Abused: No   Physically Abused: No   Sexually Abused: No   Family History  Problem Relation Age of Onset   Alzheimer's disease Mother    Cancer Father    Diabetes Son    High blood pressure Son    ROS: All systems reviewed and negative except as noted in HPI.   Current Outpatient Medications  Medication Sig Dispense Refill   atorvastatin (LIPITOR) 80 MG tablet Take 1 tablet (80 mg total) by mouth at bedtime. 90 tablet 1   carvedilol (COREG) 12.5 MG tablet TAKE 1 TABLET 2 TIMES A DAY WITH A MEAL 180 tablet 0   cetirizine (ZYRTEC) 10 MG tablet Take 10 mg by mouth as needed for  allergies.     clopidogrel (PLAVIX) 75 MG tablet Take 1 tablet (75 mg total) by mouth daily. 30 tablet 6   dapagliflozin propanediol (FARXIGA) 10 MG TABS tablet Take 1 tablet (10 mg total) by mouth daily before breakfast. 90 tablet 3   furosemide (LASIX) 20 MG tablet Take 1 tablet (20 mg total) by mouth daily as needed. 30 tablet 5   Lancets (ONETOUCH DELICA PLUS [id number]) MISC Apply 1 each topically daily. 1 each 2   metFORMIN (GLUCOPHAGE) 500 MG tablet Take 1 tablet (500 mg total) by mouth 2 (two) times daily with a meal. 180 tablet 1   nitroGLYCERIN (NITROSTAT) 0.4 MG SL tablet Place 1 tablet (0.4 mg total) under the tongue every 5 (five) minutes x 3 doses as needed for chest pain. 25 tablet 2   ONETOUCH VERIO test strip CHECK BLOOD SUGAR ONCE DAILY OR AS DIRECTED 50 strip 0   sacubitril-valsartan (ENTRESTO) 49-51 MG Take 1 tablet by mouth 2 (two) times daily. 60 tablet 5   sodium chloride (OCEAN) 0.65 % SOLN nasal spray Place 1 spray into both nostrils as needed for congestion. 30 mL 3   spironolactone (ALDACTONE) 25 MG tablet Take 1 tablet (25 mg total) by mouth daily. 90 tablet 1   No current facility-administered medications for this encounter.   BP 120/62    Pulse 86    Ht 5\' 4"  (1.626 m)    Wt 74.8 kg (165 lb)    SpO2 98%    BMI 28.32 kg/m  General: NAD Neck: No JVD, no thyromegaly or thyroid nodule.  Lungs: Clear to auscultation bilaterally with normal respiratory effort. CV: Nondisplaced PMI.  Heart regular S1/S2, no S3/S4, no murmur.  No peripheral edema.  No carotid bruit.  Normal pedal pulses.  Abdomen: Soft, nontender, no hepatosplenomegaly, no distention.  Skin: Intact without lesions or rashes.  Neurologic: Alert and oriented x 3.  Psych: Normal affect. Extremities: No clubbing or cyanosis.  HEENT: Normal.   Assessment/Plan: 1. CAD: NSTEMI in 7/21 with DES x 2 to proximal LAD.  There was a chronic total occlusion of the RCA and moderate stenosis in the PLOM.  No further  chest pain.  - She can stop ticagrelor and start Plavix 75 mg daily (> 1 year post-PCI).   - Atorvastatin 80 mg daily, good LDL in 10/22.  2. Chronic systolic CHF: Ischemic cardiomyopathy.  Echo in 7/21 with EF 25-30%, akinetic septum.  Echo in 10/21 with EF up to 55-60%.  She is not volume overloaded on exam, NYHA class I-II.  - Continue Entresto 49/51 bid.  - Continue Coreg 12.5 mg bid.  - Continue  spironolactone 25 mg daily. BMET today.  - Continue Farxiga 10 mg daily.  - With episodes of syncope, I will repeat echo.  3. Syncope: 2 episodes of syncope in 10/22 worrisome for arrhythmia.  However, she has had no episodes since that time and did not seek medical care at the time.  - I will have her wear a Zio monitor x 1 month to look for worrisome arrhythmias.   Followup 6 wks with APP.    Loralie Champagne 08/08/2021

## 2021-08-08 NOTE — Patient Instructions (Signed)
Medication Changes:  Stop Ticagrelor (Brilinta)  Start Clopidogrel (Plavix) 75 mg Daily  Lab Work:  Labs done today, your results will be available in MyChart, we will contact you for abnormal readings.  Testing/Procedures:  Your physician has requested that you have an echocardiogram. Echocardiography is a painless test that uses sound waves to create images of your heart. It provides your doctor with information about the size and shape of your heart and how well your hearts chambers and valves are working. This procedure takes approximately one hour. There are no restrictions for this procedure.  Your provider has recommended that  you wear a Zio Patch for 14 days.  This monitor will record your heart rhythm for our review.  IF you have any symptoms while wearing the monitor please press the button.  If you have any issues with the patch or you notice a red or orange light on it please call the company at [phone].  Once you remove the patch please mail it back to the company as soon as possible so we can get the results.    Referrals:  None  Special Instructions // Education:  Do the following things EVERYDAY: Weigh yourself in the morning before breakfast. Write it down and keep it in a log. Take your medicines as prescribed Eat low salt foods--Limit salt (sodium) to 2000 mg per day.  Stay as active as you can everyday Limit all fluids for the day to less than 2 liters   Follow-Up in: 6 weeks  At the [facility], you and your health needs are our priority. We have a designated team specialized in the treatment of Heart Failure. This Care Team includes your primary Heart Failure Specialized Cardiologist (physician), Advanced Practice Providers (APPs- Physician Assistants and Nurse Practitioners), and Pharmacist who all work together to provide you with the care you need, when you need it.   You may see any of the following providers on your designated  Care Team at your next follow up:  Dr Glori Bickers Dr Haynes Kerns, NP Lyda Jester, [place] [facility] [place], [place] Audry Riles, PharmD   Please be sure to bring in all your medications bottles to every appointment.   Need to Contact us:  If you have any questions or concerns before your next appointment please send [place] a message through [place] or call our office at [phone].    TO LEAVE A MESSAGE FOR THE NURSE SELECT OPTION 2, PLEASE LEAVE A MESSAGE INCLUDING: YOUR NAME DATE OF BIRTH CALL BACK NUMBER REASON FOR CALL**this is important as we prioritize the call backs  YOU WILL RECEIVE A CALL BACK THE SAME DAY AS LONG AS YOU CALL BEFORE 4:00 PM

## 2021-08-10 ENCOUNTER — Telehealth: Payer: Self-pay | Admitting: Pharmacist

## 2021-08-12 NOTE — Telephone Encounter (Signed)
Patient aware and verbalizes understanding. 

## 2021-08-12 NOTE — Telephone Encounter (Signed)
Patient dropped off forms and someone placed in my box for [employer] patient assistance for Entresto--we do not prescribe her entresto and I have not seen this patient before  I usually defer cardiac meds to cardiologist for patient assistance  If PCP wants to complete--patient can schedule an appt with me--i'm booked out until end of February

## 2021-08-17 ENCOUNTER — Other Ambulatory Visit (HOSPITAL_COMMUNITY): Payer: Self-pay | Admitting: Cardiology

## 2021-08-17 MED ORDER — SACUBITRIL-VALSARTAN 49-51 MG PO TABS: 1.0000 | ORAL_TABLET | Freq: Two times a day (BID) | ORAL | 3 refills | Status: DC

## 2021-08-23 ENCOUNTER — Other Ambulatory Visit (HOSPITAL_COMMUNITY): Payer: Self-pay

## 2021-08-23 ENCOUNTER — Ambulatory Visit
Admission: RE | Admit: 2021-08-23 | Discharge: 2021-08-23 | Disposition: A | Discharge status: Home / Self Care | Payer: No Typology Code available for payment source | Source: Ambulatory Visit | Attending: Family Medicine | Admitting: Family Medicine

## 2021-08-23 DIAGNOSIS — Z1231 Encounter for screening mammogram for malignant neoplasm of breast: Secondary | ICD-10-CM

## 2021-08-23 MED ORDER — SACUBITRIL-VALSARTAN 49-51 MG PO TABS: 1.0000 | ORAL_TABLET | Freq: Two times a day (BID) | ORAL | 3 refills | Status: DC

## 2021-08-24 ENCOUNTER — Telehealth (HOSPITAL_COMMUNITY): Payer: Self-pay | Admitting: Pharmacy Technician

## 2021-08-24 NOTE — Telephone Encounter (Signed)
Advanced Heart Failure Patient Advocate Encounter  Sent in [employer] application via fax.  Will follow up.  

## 2021-08-26 DIAGNOSIS — R55 Syncope and collapse: Secondary | ICD-10-CM | POA: Diagnosis not present

## 2021-08-26 NOTE — Telephone Encounter (Signed)
Called and spoke with the patient.   Charlann Boxer, CPhT

## 2021-08-26 NOTE — Telephone Encounter (Signed)
Advanced Heart Failure Patient Advocate Encounter  Received fax from [employer], patient's ENTRESTO application has been APPROVED. Coverage dates are from 08/25/21 to 07/30/22.   Document scanned into chart   Phone# [phone] Fax# [phone]

## 2021-09-05 ENCOUNTER — Encounter (HOSPITAL_COMMUNITY): Payer: Self-pay

## 2021-09-08 NOTE — Progress Notes (Signed)
PCP: [name], FNP Cardiology: Dr. Aundra Dubin  71 y.o. with history of NSTEMI and ischemic cardiomyopathy presents for followup of CHF and CAD.  Patient was admitted in 7/21 with NSTEMI.  Cath showed LAD culprit, patient had DES x 2 to proximal LAD.  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 Echo was done showing EF 25-30%, septal akinesis. Patient was started on cardiac meds and was discharged home with a Lifevest. A new diagnosis of diabetes was made and she was started on metformin.   Echo in 10/21 showed EF up to 55-60% with normal RV.   Follow up 1/23, she had 2 syncopal episodes in 10/22 with no prodrome, PCP advised eval in ER however she declined. Zio placed and repeat echo arranged.   Echo today (09/12/21) results pending.  Today she returns for HF follow up. Main complaint is leg pain at night, so bad it is disturbing her sleep for the past week 1 week. No claudication-type pain with walking. She has dyspnea walking up steps. No further dizziness or syncopal events. Denies palpitations, abnormal bleeding, CP, edema, or PND/Orthopnea. Appetite ok. No fever or chills. Weight at home 166-167 pounds. Taking all medications. Took extra Lasix last week.  Labs (7/21): K 4.1, creatinine 0.87 => 0.98 Labs (9/21): BNP 59, K 4.1, creatinine 1.2, LDL 37, TGs 197, HDL 27 Labs (10/22): K 5.3, creatinine 1.03, LDL 21, TGs 148 Labs (1/23): K 4.5, creatinine 1.06  ECG (personally reviewed): none ordered today.  PMH: 1. Type 2 diabetes: New diagnosis in 7/21.  2. Hyperlipidemia 3. Chronic systolic CHF: Ischemic cardiomyopathy.   - Echo (7/21): EF 25-30%, septal akinesis.   - Echo (10/21): EF 55-60%, normal RV, mild MR.  4. CAD: NSTEMI in 7/21.  - LHC: DES x 2 to proximal LAD, CTO RCA with collaterals, moderate PLOM stenosis.  5. Syncope: x 2 in 10/22.   Social History   Socioeconomic History   Marital status: Married    Spouse name: Not on file    Number of children: 1   Years of education: Not on file   Highest education level: Not on file  Occupational History   Occupation: retired  Tobacco Use   Smoking status: Never   Smokeless tobacco: Never  Substance and Sexual Activity   Alcohol use: No   Drug use: No   Sexual activity: Not on file  Other Topics Concern   Not on file  Social History Narrative   Son living with them right now   Social Determinants of Health   Financial Resource Strain: Medium Risk   Difficulty of Paying Living Expenses: Somewhat hard  Food Insecurity: Landscape architect Present   Worried About Charity fundraiser in the Last Year: Often true   Arboriculturist in the Last Year: Sometimes true  Transportation Needs: No Transportation Needs   Lack of Transportation (Medical): No   Lack of Transportation (Non-Medical): No  Physical Activity: Insufficiently Active   Days of Exercise per Week: 5 days   Minutes of Exercise per Session: 10 min  Stress: Stress Concern Present   Feeling of Stress : To some extent  Social Connections: Engineer, building services of Communication with Friends and Family: Twice a week   Frequency of Social Gatherings with Friends and Family: Twice a week   Attends Religious Services: More than 4 times per year   Active Member of Clubs or Organizations: Yes   Attends  Club or Organization Meetings: 1 to 4 times per year   Marital Status: Married  Human resources officer Violence: Not At Risk   Fear of Current or Ex-Partner: No   Emotionally Abused: No   Physically Abused: No   Sexually Abused: No   Family History  Problem Relation Age of Onset   Alzheimer's disease Mother    Cancer Father    Diabetes Son    High blood pressure Son    ROS: All systems reviewed and negative except as noted in HPI.   Current Outpatient Medications  Medication Sig Dispense Refill   atorvastatin (LIPITOR) 80 MG tablet Take 1 tablet (80 mg total) by mouth at bedtime. 90 tablet 1    carvedilol (COREG) 12.5 MG tablet TAKE 1 TABLET 2 TIMES A DAY WITH A MEAL 180 tablet 0   cetirizine (ZYRTEC) 10 MG tablet Take 10 mg by mouth as needed for allergies.     clopidogrel (PLAVIX) 75 MG tablet Take 1 tablet (75 mg total) by mouth daily. 30 tablet 6   dapagliflozin propanediol (FARXIGA) 10 MG TABS tablet Take 1 tablet (10 mg total) by mouth daily before breakfast. 90 tablet 3   furosemide (LASIX) 20 MG tablet Take 1 tablet (20 mg total) by mouth daily as needed. 30 tablet 5   Lancets (ONETOUCH DELICA PLUS [id number]) MISC Apply 1 each topically daily. 1 each 2   metFORMIN (GLUCOPHAGE) 500 MG tablet Take 1 tablet (500 mg total) by mouth 2 (two) times daily with a meal. 180 tablet 1   nitroGLYCERIN (NITROSTAT) 0.4 MG SL tablet Place 1 tablet (0.4 mg total) under the tongue every 5 (five) minutes x 3 doses as needed for chest pain. 25 tablet 2   ONETOUCH VERIO test strip CHECK BLOOD SUGAR ONCE DAILY OR AS DIRECTED 50 strip 0   sacubitril-valsartan (ENTRESTO) 49-51 MG Take 1 tablet by mouth 2 (two) times daily. 180 tablet 3   sodium chloride (OCEAN) 0.65 % SOLN nasal spray Place 1 spray into both nostrils as needed for congestion. 30 mL 3   spironolactone (ALDACTONE) 25 MG tablet Take 1 tablet (25 mg total) by mouth daily. 90 tablet 1   No current facility-administered medications for this encounter.   Wt Readings from Last 3 Encounters:  09/12/21 76.2 kg (168 lb)  08/08/21 74.8 kg (165 lb)  07/15/21 72.6 kg (160 lb)   BP 132/70    Pulse 74    Wt 76.2 kg (168 lb)    SpO2 98%    BMI 28.84 kg/m   General:  NAD. No resp difficulty HEENT: Normal Neck: Supple. No JVD. Carotids 2+ bilat; no bruits. No lymphadenopathy or thryomegaly appreciated. Cor: PMI nondisplaced. Regular rate & rhythm. No rubs, gallops or murmurs. Lungs: Clear Abdomen: Soft, nontender, nondistended. No hepatosplenomegaly. No bruits or masses. Good bowel sounds. Extremities: No cyanosis, clubbing, rash, edema;  bilateral varicose veins. Neuro: Alert & oriented x 3, cranial nerves grossly intact. Moves all 4 extremities w/o difficulty. Affect pleasant.  Assessment/Plan: 1. CAD: NSTEMI in 7/21 with DES x 2 to proximal LAD.  There was a chronic total occlusion of the RCA and moderate stenosis in the PLOM.  No further chest pain.  - Continue Plavix 75 mg daily (> 1 year post-PCI).   - Atorvastatin 80 mg daily, good LDL in 10/22.  2. Chronic systolic CHF: Ischemic cardiomyopathy.  Echo in 7/21 with EF 25-30%, akinetic septum.  Echo in 10/21 with EF up to 55-60%.  She is  not volume overloaded on exam, stable NYHA class II.  - Increase Entresto to 97/103 mg bid. BMET today, repeat in 10-14 days. - Continue Coreg 12.5 mg bid.  - Continue spironolactone 25 mg daily.  - Continue Farxiga 10 mg daily.  3. Syncope: 2 episodes of syncope in 10/22 worrisome for arrhythmia.  However, she has had no episodes since that time and did not seek medical care at the time.  - Zio monitor x 1 month (2/23) showed no worrisome arrhythmias.  - Echo today, results pending. 4.Bilateral leg pain: Sounds like diabetic neuropathy vs RLS. Pedal pulses palpable. She has bilateral varicosities.  - Advised light compression hose during the day to see if this helps with symptoms. - Check LFTs + CK as she is on statin.  - Check magnesium. - Will need follow up with PCP. 5. DM2: hgb A1C 6.6 (10/22). On metformin. - Continue Farxiga. No GU symptoms.  Follow up in 3 months with Dr. Wynema Birch [facility] FNP 09/12/2021

## 2021-09-12 ENCOUNTER — Encounter (HOSPITAL_COMMUNITY): Payer: Self-pay

## 2021-09-12 ENCOUNTER — Ambulatory Visit (HOSPITAL_BASED_OUTPATIENT_CLINIC_OR_DEPARTMENT_OTHER)
Admission: RE | Admit: 2021-09-12 | Discharge: 2021-09-12 | Disposition: A | Discharge status: Home / Self Care | Payer: No Typology Code available for payment source | Source: Ambulatory Visit | Attending: Family Medicine | Admitting: Family Medicine

## 2021-09-12 ENCOUNTER — Other Ambulatory Visit: Payer: Self-pay

## 2021-09-12 ENCOUNTER — Ambulatory Visit (HOSPITAL_COMMUNITY)
Admission: RE | Admit: 2021-09-12 | Discharge: 2021-09-12 | Disposition: A | Discharge status: Home / Self Care | Payer: No Typology Code available for payment source | Source: Ambulatory Visit | Attending: Family Medicine | Admitting: Family Medicine

## 2021-09-12 VITALS — BP 132/70 | HR 74 | Wt 168.0 lb

## 2021-09-12 DIAGNOSIS — I255 Ischemic cardiomyopathy: Secondary | ICD-10-CM | POA: Diagnosis not present

## 2021-09-12 DIAGNOSIS — I5042 Chronic combined systolic (congestive) and diastolic (congestive) heart failure: Secondary | ICD-10-CM | POA: Diagnosis not present

## 2021-09-12 DIAGNOSIS — I428 Other cardiomyopathies: Secondary | ICD-10-CM | POA: Diagnosis not present

## 2021-09-12 DIAGNOSIS — I351 Nonrheumatic aortic (valve) insufficiency: Secondary | ICD-10-CM | POA: Diagnosis not present

## 2021-09-12 DIAGNOSIS — M79604 Pain in right leg: Secondary | ICD-10-CM | POA: Diagnosis not present

## 2021-09-12 DIAGNOSIS — E1159 Type 2 diabetes mellitus with other circulatory complications: Secondary | ICD-10-CM | POA: Diagnosis not present

## 2021-09-12 DIAGNOSIS — E119 Type 2 diabetes mellitus without complications: Secondary | ICD-10-CM | POA: Insufficient documentation

## 2021-09-12 DIAGNOSIS — I252 Old myocardial infarction: Secondary | ICD-10-CM | POA: Insufficient documentation

## 2021-09-12 DIAGNOSIS — I2510 Atherosclerotic heart disease of native coronary artery without angina pectoris: Secondary | ICD-10-CM | POA: Diagnosis not present

## 2021-09-12 DIAGNOSIS — M79605 Pain in left leg: Secondary | ICD-10-CM | POA: Diagnosis not present

## 2021-09-12 DIAGNOSIS — R55 Syncope and collapse: Secondary | ICD-10-CM

## 2021-09-12 LAB — CK: Total CK: 39 U/L (ref 38–234)

## 2021-09-12 LAB — COMPREHENSIVE METABOLIC PANEL
ALT: 19 U/L (ref 0–44)
AST: 17 U/L (ref 15–41)
Albumin: 4.3 g/dL (ref 3.5–5.0)
Alkaline Phosphatase: 85 U/L (ref 38–126)
Anion gap: 9 (ref 5–15)
BUN: 20 mg/dL (ref 8–23)
CO2: 24 mmol/L (ref 22–32)
Calcium: 9.2 mg/dL (ref 8.9–10.3)
Chloride: 107 mmol/L (ref 98–111)
Creatinine, Ser: 0.93 mg/dL (ref 0.44–1.00)
GFR, Estimated: >60 mL/min (ref >60)
Glucose, Bld: 156 mg/dL — ABNORMAL HIGH (ref 70–99)
Potassium: 4.3 mmol/L (ref 3.5–5.1)
Sodium: 140 mmol/L (ref 135–145)
Total Bilirubin: 0.6 mg/dL (ref 0.3–1.2)
Total Protein: 7.7 g/dL (ref 6.5–8.1)

## 2021-09-12 LAB — ECHOCARDIOGRAM COMPLETE
AV Vena cont: 0.2 cm
Area-P 1/2: 3.99 cm2
Calc EF: 58.4 %
P 1/2 time: 592 msec
S' Lateral: 3 cm
Single Plane A2C EF: 55.2 %
Single Plane A4C EF: 57 %

## 2021-09-12 LAB — MAGNESIUM: Magnesium: 2.4 mg/dL (ref 1.7–2.4)

## 2021-09-12 MED ORDER — ENTRESTO 97-103 MG PO TABS: 1.0000 | ORAL_TABLET | Freq: Two times a day (BID) | ORAL | 4 refills | Status: DC

## 2021-09-12 NOTE — Progress Notes (Signed)
°  Echocardiogram 2D Echocardiogram has been performed.  Fidel Levy 09/12/2021, 8:49 AM

## 2021-09-12 NOTE — Patient Instructions (Addendum)
Thank you for coming in today  Labs were done today, if any labs are abnormal the clinic will call you  INCREASE Entresto to 97/103 mg 1 tablet twice daily   Please call this numbert to discuss donating your medication [phone]   Your physician recommends that you schedule a follow-up appointment in: 3 months   At the [facility], you and your health needs are our priority. As part of our continuing mission to provide you with exceptional heart care, we have created designated Provider Care Teams. These Care Teams include your primary Cardiologist (physician) and Advanced Practice Providers (APPs- Physician Assistants and Nurse Practitioners) who all work together to provide you with the care you need, when you need it.   You may see any of the following providers on your designated Care Team at your next follow up: Dr Glori Bickers Dr Haynes Kerns, NP Lyda Jester, [place] [facility] [place], [place] Audry Riles, PharmD   Please be sure to bring in all your medications bottles to every appointment.   If you have any questions or concerns before your next appointment please send [place] a message through [place] or call our office at [phone].    TO LEAVE A MESSAGE FOR THE NURSE SELECT OPTION 2, PLEASE LEAVE A MESSAGE INCLUDING: YOUR NAME DATE OF BIRTH CALL BACK NUMBER REASON FOR CALL**this is important as we prioritize the call backs  YOU WILL RECEIVE A CALL BACK THE SAME DAY AS LONG AS YOU CALL BEFORE 4:00 PM

## 2021-09-13 ENCOUNTER — Telehealth (HOSPITAL_COMMUNITY): Payer: Self-pay

## 2021-09-13 NOTE — Telephone Encounter (Addendum)
Pt aware, agreeable, and verbalized understanding   ----- Message from Rafael Bihari, FNP sent at 09/12/2021  4:53 PM EST ----- Labs stable, no electrolyte disturbances that could explain leg pain. Will need to follow up with PCP.

## 2021-09-26 ENCOUNTER — Other Ambulatory Visit: Payer: Self-pay

## 2021-09-26 ENCOUNTER — Ambulatory Visit (HOSPITAL_COMMUNITY)
Admission: RE | Admit: 2021-09-26 | Discharge: 2021-09-26 | Disposition: A | Discharge status: Home / Self Care | Payer: No Typology Code available for payment source | Source: Ambulatory Visit | Attending: Internal Medicine | Admitting: Internal Medicine

## 2021-09-26 DIAGNOSIS — I5042 Chronic combined systolic (congestive) and diastolic (congestive) heart failure: Secondary | ICD-10-CM | POA: Diagnosis not present

## 2021-09-26 LAB — BASIC METABOLIC PANEL
Anion gap: 8 (ref 5–15)
BUN: 17 mg/dL (ref 8–23)
CO2: 24 mmol/L (ref 22–32)
Calcium: 9.4 mg/dL (ref 8.9–10.3)
Chloride: 108 mmol/L (ref 98–111)
Creatinine, Ser: 0.94 mg/dL (ref 0.44–1.00)
GFR, Estimated: >60 mL/min (ref >60)
Glucose, Bld: 151 mg/dL — ABNORMAL HIGH (ref 70–99)
Potassium: 4.5 mmol/L (ref 3.5–5.1)
Sodium: 140 mmol/L (ref 135–145)

## 2021-10-20 ENCOUNTER — Other Ambulatory Visit (HOSPITAL_COMMUNITY): Payer: Self-pay

## 2021-10-20 ENCOUNTER — Telehealth (HOSPITAL_COMMUNITY): Payer: Self-pay | Admitting: Pharmacy Technician

## 2021-10-20 MED ORDER — ENTRESTO 97-103 MG PO TABS: 1.0000 | ORAL_TABLET | Freq: Two times a day (BID) | ORAL | 3 refills | Status: DC

## 2021-10-20 NOTE — Telephone Encounter (Signed)
Advanced Heart Failure Patient Advocate Encounter ? ?Patient called in stating that the Entresto 97-'103mg'$  RX has not been sent to [employer] and that she is low on medication. Sent 90 day RX request to [employer], corporate) to send to [employer] ([facility] [employer]).  ? ?Becky Boyle, CPhT ? ? ?

## 2021-10-24 ENCOUNTER — Other Ambulatory Visit (HOSPITAL_COMMUNITY): Payer: Self-pay | Admitting: Cardiology

## 2021-11-01 ENCOUNTER — Other Ambulatory Visit: Payer: Self-pay | Admitting: Family Medicine

## 2021-11-01 DIAGNOSIS — E1165 Type 2 diabetes mellitus with hyperglycemia: Secondary | ICD-10-CM

## 2021-11-01 DIAGNOSIS — I252 Old myocardial infarction: Secondary | ICD-10-CM

## 2021-11-02 ENCOUNTER — Other Ambulatory Visit: Payer: Self-pay | Admitting: Family Medicine

## 2021-11-02 DIAGNOSIS — E1165 Type 2 diabetes mellitus with hyperglycemia: Secondary | ICD-10-CM

## 2021-11-03 ENCOUNTER — Encounter: Payer: Self-pay | Admitting: Family Medicine

## 2021-11-03 ENCOUNTER — Ambulatory Visit (INDEPENDENT_AMBULATORY_CARE_PROVIDER_SITE_OTHER): Payer: No Typology Code available for payment source | Admitting: Family Medicine

## 2021-11-03 VITALS — BP 123/71 | HR 74 | Temp 96.8°F | Ht 64.0 in | Wt 166.6 lb

## 2021-11-03 DIAGNOSIS — I5042 Chronic combined systolic (congestive) and diastolic (congestive) heart failure: Secondary | ICD-10-CM

## 2021-11-03 DIAGNOSIS — H6122 Impacted cerumen, left ear: Secondary | ICD-10-CM | POA: Diagnosis not present

## 2021-11-03 DIAGNOSIS — Z0001 Encounter for general adult medical examination with abnormal findings: Secondary | ICD-10-CM

## 2021-11-03 DIAGNOSIS — N1831 Chronic kidney disease, stage 3a: Secondary | ICD-10-CM

## 2021-11-03 DIAGNOSIS — E1165 Type 2 diabetes mellitus with hyperglycemia: Secondary | ICD-10-CM

## 2021-11-03 DIAGNOSIS — R7989 Other specified abnormal findings of blood chemistry: Secondary | ICD-10-CM | POA: Diagnosis not present

## 2021-11-03 DIAGNOSIS — I252 Old myocardial infarction: Secondary | ICD-10-CM

## 2021-11-03 DIAGNOSIS — Z0000 Encounter for general adult medical examination without abnormal findings: Secondary | ICD-10-CM

## 2021-11-03 LAB — BAYER DCA HB A1C WAIVED: HB A1C (BAYER DCA - WAIVED): 6.9 % — ABNORMAL HIGH (ref 4.8–5.6)

## 2021-11-03 MED ORDER — SPIRONOLACTONE 25 MG PO TABS: 25.0000 mg | ORAL_TABLET | Freq: Every day | ORAL | 1 refills | Status: DC

## 2021-11-03 NOTE — Progress Notes (Signed)
? ?Assessment & Plan:  ?1. Well adult exam ?Preventive health education provided. ? ?2. Type 2 diabetes mellitus with hyperglycemia, without long-term current use of insulin ([place]) ?Lab Results  ?Component Value Date  ? HGBA1C 6.9 (H) 11/03/2021  ? HGBA1C 6.6 (H) 05/05/2021  ? HGBA1C 7.6 (H) 09/22/2020  ?  ?- Diabetes is at goal of A1c < 7. ?- Medications: continue current medications ?- Home glucose monitoring: continue monitoring. ?- Patient is currently taking a statin. Patient is taking an ACE-inhibitor/ARB.  ?- Instruction/counseling given: discussed diet ? ?Diabetes Health Maintenance Due  ?Topic Date Due  ? OPHTHALMOLOGY EXAM  Never done  ? HEMOGLOBIN A1C  05/05/2022  ? FOOT EXAM  11/04/2022  ?  ?Lab Results  ?Component Value Date  ? LABMICR 21.7 03/22/2020  ? ?- Lipid panel ?- CMP14+EGFR ?- CBC with Differential/Platelet ?- Bayer DCA Hb A1c Waived ?- Vitamin B12 ?- Microalbumin / creatinine urine ratio ? ?3. Chronic combined systolic and diastolic heart failure ([place]) ?Well controlled on current regimen.  ?- spironolactone (ALDACTONE) 25 MG tablet; Take 1 tablet (25 mg total) by mouth daily.  Dispense: 90 tablet; Refill: 1 ? ?4. Stage 3a chronic kidney disease ([place]) ?Stable. Continue Farxiga.  ? ?5. History of non-ST elevation myocardial infarction (NSTEMI) ?Continue atorvastatin and Plavix.  ? ?6. Elevated TSH ?Labs to reassess. ?- TSH ? ?7. Impacted cerumen of left ear ?Encouraged to purchase Debrox wax removal kit over the counter. Drops (3-4) should be instilled twice daily x3 days, then the ear flushed with warm water on the 4th day.  ? ? ?Follow-up: Return in about 6 months (around 05/05/2022) for follow-up of chronic medication conditions.  ? ?Hendricks Limes, MSN, APRN, FNP-C ?[place] ? ?Subjective:  ?Patient ID: Becky Boyle, female    DOB: [birth year]  Age: 71 y.o. MRN: [id number] ? ?Patient Care Team: ?[name], FNP as PCP - General (Family Medicine) ?Larey Dresser,  MD as Consulting Physician (Cardiology)  ? ?CC:  ?Chief Complaint  ?Patient presents with  ? Annual Exam  ? ?HPI ?Becky Boyle presents for her annual physical.  ? ?Occupation: retired, Marital status: married, Substance use: none ?Diet: regular, Exercise: walking daily ?Last eye exam: October 2022 ?Last dental exam: unknown ?Last colorectal cancer screening: 10/19/2020 ?Last mammogram: 08/23/2021 ?DEXA: never ?Hepatitis C Screening: declined ?Immunizations:  ?Tdap Vaccine: declined  ?Shingrix Vaccine: up to date  ?COVID-19 Vaccine: up to date ?Pneumonia Vaccine: up to date ? ?Advanced Directives ?Patient does not have advanced directives including DNR, living will, healthcare power of attorney, financial power of attorney, and MOST form.  ? ?Diabetes: Patient presents for follow up of diabetes. Current symptoms include: hyperglycemia. Known diabetic complications: cardiovascular disease. Medication compliance: yes. Current diet: in general, a "healthy" diet  . Current exercise: walking. Home blood sugar records: BGs are running  consistent with Hgb A1C. Is she  on ACE inhibitor or angiotensin II receptor blocker? Yes Delene Loll). Is she on a statin? Yes (Atorvastatin).   ? ?Hypertension/Heart Failure/History of NSTEMI: Taking coreg 12.61m, Entresto 97-103 mg, furosemide 20 mg, and spironolactone 25 mg. She takes her BP at home and gets readings 120s-140s.  ?  ?Anxiety/Depression:  She does not want to take anything for anxiety at this time, she states she manages well without it.  ? ? ?  11/03/2021  ?  9:16 AM 05/05/2021  ?  2:45 PM 09/22/2020  ?  9:13 AM 03/22/2020  ?  9:43 AM  ?  GAD 7 : Generalized Anxiety Score  ?Nervous, Anxious, on Edge 0 0 3 0  ?Control/stop worrying 0 0 3 0  ?Worry too much - different things 0 1 3 0  ?Trouble relaxing 0 0 3 0  ?Restless 0 0 2 0  ?Easily annoyed or irritable 0 1 2 0  ?Afraid - awful might happen 0 0 2 0  ?Total GAD 7 Score 0 2 18 0  ?Anxiety Difficulty Not difficult at all Not  difficult at all Somewhat difficult Not difficult at all  ? ? ?  11/03/2021  ?  9:16 AM 07/15/2021  ?  9:13 AM 05/05/2021  ?  2:45 PM  ?Depression screen PHQ 2/9  ?Decreased Interest 0 0 0  ?Down, Depressed, Hopeless 0 1 0  ?PHQ - 2 Score 0 1 0  ?Altered sleeping 0 1 1  ?Tired, decreased energy 0 1 2  ?Change in appetite 0 1 2  ?Feeling bad or failure about yourself  0 0 0  ?Trouble concentrating 0 0 0  ?Moving slowly or fidgety/restless 0 0 0  ?Suicidal thoughts 0 0 0  ?PHQ-9 Score 0 4 5  ?Difficult doing work/chores Not difficult at all Somewhat difficult Not difficult at all  ? ? ?Review of Systems  ?Constitutional:  Negative for chills, fever, malaise/fatigue and weight loss.  ?HENT:  Negative for congestion, ear discharge, ear pain, nosebleeds, sinus pain, sore throat and tinnitus.   ?Eyes:  Negative for blurred vision, double vision, pain, discharge and redness.  ?Respiratory:  Negative for cough, shortness of breath and wheezing.   ?Cardiovascular:  Negative for chest pain, palpitations and leg swelling.  ?Gastrointestinal:  Negative for abdominal pain, constipation, diarrhea, heartburn, nausea and vomiting.  ?Genitourinary:  Negative for dysuria, frequency and urgency.  ?Musculoskeletal:  Negative for myalgias.  ?Skin:  Negative for rash.  ?Neurological:  Negative for dizziness, seizures, weakness and headaches.  ?Psychiatric/Behavioral:  Negative for depression, substance abuse and suicidal ideas. The patient is not nervous/anxious.   ? ? ?Current Outpatient Medications:  ?  atorvastatin (LIPITOR) 80 MG tablet, TAKE ONE TABLET AT BEDTIME, Disp: 90 tablet, Rfl: 0 ?  carvedilol (COREG) 12.5 MG tablet, TAKE 1 TABLET 2 TIMES A DAY WITH A MEAL, Disp: 180 tablet, Rfl: 0 ?  cetirizine (ZYRTEC) 10 MG tablet, Take 10 mg by mouth as needed for allergies., Disp: , Rfl:  ?  clopidogrel (PLAVIX) 75 MG tablet, Take 1 tablet (75 mg total) by mouth daily., Disp: 30 tablet, Rfl: 6 ?  dapagliflozin propanediol (FARXIGA) 10 MG  TABS tablet, Take 1 tablet (10 mg total) by mouth daily before breakfast., Disp: 90 tablet, Rfl: 3 ?  furosemide (LASIX) 20 MG tablet, Take 1 tablet (20 mg total) by mouth daily as needed., Disp: 30 tablet, Rfl: 5 ?  Lancets (ONETOUCH DELICA PLUS [id number]) MISC, Apply 1 each topically daily., Disp: 1 each, Rfl: 2 ?  metFORMIN (GLUCOPHAGE) 500 MG tablet, TAKE ONE TABLET TWICE DAILY CF, Disp: 180 tablet, Rfl: 0 ?  nitroGLYCERIN (NITROSTAT) 0.4 MG SL tablet, Place 1 tablet (0.4 mg total) under the tongue every 5 (five) minutes x 3 doses as needed for chest pain., Disp: 25 tablet, Rfl: 2 ?  ONETOUCH VERIO test strip, CHECK BLOOD SUGAR ONCE DAILY OR AS DIRECTED, Disp: 50 strip, Rfl: 0 ?  sacubitril-valsartan (ENTRESTO) 97-103 MG, Take 1 tablet by mouth 2 (two) times daily., Disp: 180 tablet, Rfl: 3 ?  sodium chloride (OCEAN) 0.65 % SOLN nasal spray, Place  1 spray into both nostrils as needed for congestion., Disp: 30 mL, Rfl: 3 ?  spironolactone (ALDACTONE) 25 MG tablet, Take 1 tablet (25 mg total) by mouth daily., Disp: 90 tablet, Rfl: 1 ? ?Allergies  ?Allergen Reactions  ? Latex Anaphylaxis  ? Penicillins Hives  ? Codeine Rash  ? Tape Rash  ? ? ?Past Medical History:  ?Diagnosis Date  ? Anxiety   ? with panic attacks  ? Benign brain tumor ([place])   ? CKD (chronic kidney disease) stage 3, GFR 30-59 ml/min (HCC)   ? Depression   ? Diabetes ([place])   ? Heart failure ([place])   ? History of non-ST elevation myocardial infarction (NSTEMI)   ? ? ?Past Surgical History:  ?Procedure Laterality Date  ? ABDOMINAL HYSTERECTOMY    ? BRAIN SURGERY  1989  ? tumor   ? CORONARY STENT INTERVENTION N/A 02/17/2020  ? Procedure: CORONARY STENT INTERVENTION;  Surgeon: Jettie Booze, MD;  Location: [place] [facility];  Service: Cardiovascular;  Laterality: N/A;  ? RIGHT/LEFT HEART CATH AND CORONARY ANGIOGRAPHY N/A 02/17/2020  ? Procedure: RIGHT/LEFT HEART CATH AND CORONARY ANGIOGRAPHY;  Surgeon: Jettie Booze, MD;  Location: [place] [facility];  Service: Cardiovascular;  Laterality: N/A;  ? ? ?Family History  ?Problem Relation Age of Onset  ? Alzheimer's disease Mother   ? Cancer Father   ? Diabetes Son   ? High blood pressure Son   ?

## 2021-11-04 ENCOUNTER — Encounter: Payer: Self-pay | Admitting: Family Medicine

## 2021-11-04 DIAGNOSIS — E538 Deficiency of other specified B group vitamins: Secondary | ICD-10-CM

## 2021-11-04 HISTORY — DX: Deficiency of other specified B group vitamins: E53.8

## 2021-11-04 LAB — LIPID PANEL
Chol/HDL Ratio: 3.3 ratio (ref 0.0–4.4)
Cholesterol, Total: 89 mg/dL — ABNORMAL LOW (ref 100–199)
HDL: 27 mg/dL — ABNORMAL LOW
LDL Chol Calc (NIH): 33 mg/dL (ref 0–99)
Triglycerides: 177 mg/dL — ABNORMAL HIGH (ref 0–149)
VLDL Cholesterol Cal: 29 mg/dL (ref 5–40)

## 2021-11-04 LAB — CMP14+EGFR
ALT: 12 IU/L (ref 0–32)
AST: 11 IU/L (ref 0–40)
Albumin/Globulin Ratio: 1.9 (ref 1.2–2.2)
Albumin: 4.7 g/dL (ref 3.8–4.8)
Alkaline Phosphatase: 95 IU/L (ref 44–121)
BUN/Creatinine Ratio: 15 (ref 12–28)
BUN: 17 mg/dL (ref 8–27)
Bilirubin Total: 0.8 mg/dL (ref 0.0–1.2)
CO2: 22 mmol/L (ref 20–29)
Calcium: 9.7 mg/dL (ref 8.7–10.3)
Chloride: 105 mmol/L (ref 96–106)
Creatinine, Ser: 1.1 mg/dL — ABNORMAL HIGH (ref 0.57–1.00)
Globulin, Total: 2.5 g/dL (ref 1.5–4.5)
Glucose: 147 mg/dL — ABNORMAL HIGH (ref 70–99)
Potassium: 4.4 mmol/L (ref 3.5–5.2)
Sodium: 143 mmol/L (ref 134–144)
Total Protein: 7.2 g/dL (ref 6.0–8.5)
eGFR: 54 mL/min/{1.73_m2} — ABNORMAL LOW (ref >59)

## 2021-11-04 LAB — CBC WITH DIFFERENTIAL/PLATELET
Basophils Absolute: 0 10*3/uL (ref 0.0–0.2)
Basos: 0 %
EOS (ABSOLUTE): 0.2 10*3/uL (ref 0.0–0.4)
Eos: 3 %
Hematocrit: 36.8 % (ref 34.0–46.6)
Hemoglobin: 12.6 g/dL (ref 11.1–15.9)
Immature Grans (Abs): 0 10*3/uL (ref 0.0–0.1)
Immature Granulocytes: 0 %
Lymphocytes Absolute: 1.5 10*3/uL (ref 0.7–3.1)
Lymphs: 22 %
MCH: 30.8 pg (ref 26.6–33.0)
MCHC: 34.2 g/dL (ref 31.5–35.7)
MCV: 90 fL (ref 79–97)
Monocytes Absolute: 0.7 10*3/uL (ref 0.1–0.9)
Monocytes: 10 %
Neutrophils Absolute: 4.4 10*3/uL (ref 1.4–7.0)
Neutrophils: 65 %
Platelets: 221 10*3/uL (ref 150–450)
RBC: 4.09 x10E6/uL (ref 3.77–5.28)
RDW: 12.3 % (ref 11.7–15.4)
WBC: 6.9 10*3/uL (ref 3.4–10.8)

## 2021-11-04 LAB — VITAMIN B12: Vitamin B-12: 214 pg/mL — ABNORMAL LOW (ref 232–1245)

## 2021-11-04 LAB — TSH: TSH: 10.1 u[IU]/mL — ABNORMAL HIGH (ref 0.450–4.500)

## 2021-11-05 ENCOUNTER — Other Ambulatory Visit: Payer: Self-pay | Admitting: Family Medicine

## 2021-11-05 ENCOUNTER — Encounter: Payer: Self-pay | Admitting: Family Medicine

## 2021-11-05 DIAGNOSIS — R7989 Other specified abnormal findings of blood chemistry: Secondary | ICD-10-CM

## 2021-11-07 ENCOUNTER — Other Ambulatory Visit: Payer: No Typology Code available for payment source

## 2021-11-07 ENCOUNTER — Other Ambulatory Visit (HOSPITAL_COMMUNITY): Payer: Self-pay

## 2021-11-07 DIAGNOSIS — R7989 Other specified abnormal findings of blood chemistry: Secondary | ICD-10-CM

## 2021-11-07 LAB — MICROALBUMIN / CREATININE URINE RATIO
Creatinine, Urine: 143.5 mg/dL
Microalb/Creat Ratio: 24 mg/g creat (ref 0–29)
Microalbumin, Urine: 33.9 ug/mL

## 2021-11-07 MED ORDER — DAPAGLIFLOZIN PROPANEDIOL 10 MG PO TABS: 10.0000 mg | ORAL_TABLET | Freq: Every day | ORAL | 3 refills | Status: DC

## 2021-11-08 LAB — TSH: TSH: 5.8 u[IU]/mL — ABNORMAL HIGH (ref 0.450–4.500)

## 2021-11-08 LAB — T3, FREE: T3, Free: 2.7 pg/mL (ref 2.0–4.4)

## 2021-11-08 LAB — T4, FREE: Free T4: 0.96 ng/dL (ref 0.82–1.77)

## 2021-12-14 ENCOUNTER — Ambulatory Visit (HOSPITAL_COMMUNITY)
Admission: RE | Admit: 2021-12-14 | Discharge: 2021-12-14 | Disposition: A | Discharge status: Home / Self Care | Payer: No Typology Code available for payment source | Source: Ambulatory Visit | Attending: Cardiology | Admitting: Cardiology

## 2021-12-14 ENCOUNTER — Encounter (HOSPITAL_COMMUNITY): Payer: Self-pay | Admitting: Cardiology

## 2021-12-14 ENCOUNTER — Encounter (HOSPITAL_COMMUNITY): Payer: No Typology Code available for payment source | Admitting: Cardiology

## 2021-12-14 VITALS — BP 120/78 | HR 69 | Wt 170.4 lb

## 2021-12-14 DIAGNOSIS — R55 Syncope and collapse: Secondary | ICD-10-CM | POA: Insufficient documentation

## 2021-12-14 DIAGNOSIS — Z7902 Long term (current) use of antithrombotics/antiplatelets: Secondary | ICD-10-CM | POA: Insufficient documentation

## 2021-12-14 DIAGNOSIS — Z955 Presence of coronary angioplasty implant and graft: Secondary | ICD-10-CM | POA: Insufficient documentation

## 2021-12-14 DIAGNOSIS — Z79899 Other long term (current) drug therapy: Secondary | ICD-10-CM | POA: Insufficient documentation

## 2021-12-14 DIAGNOSIS — I252 Old myocardial infarction: Secondary | ICD-10-CM | POA: Diagnosis not present

## 2021-12-14 DIAGNOSIS — I5022 Chronic systolic (congestive) heart failure: Secondary | ICD-10-CM | POA: Diagnosis not present

## 2021-12-14 DIAGNOSIS — I5042 Chronic combined systolic (congestive) and diastolic (congestive) heart failure: Secondary | ICD-10-CM | POA: Diagnosis not present

## 2021-12-14 DIAGNOSIS — I2510 Atherosclerotic heart disease of native coronary artery without angina pectoris: Secondary | ICD-10-CM

## 2021-12-14 DIAGNOSIS — I255 Ischemic cardiomyopathy: Secondary | ICD-10-CM | POA: Diagnosis not present

## 2021-12-14 LAB — BASIC METABOLIC PANEL
Anion gap: 7 (ref 5–15)
BUN: 23 mg/dL (ref 8–23)
CO2: 23 mmol/L (ref 22–32)
Calcium: 9 mg/dL (ref 8.9–10.3)
Chloride: 109 mmol/L (ref 98–111)
Creatinine, Ser: 0.84 mg/dL (ref 0.44–1.00)
GFR, Estimated: >60 mL/min (ref >60)
Glucose, Bld: 155 mg/dL — ABNORMAL HIGH (ref 70–99)
Potassium: 4.1 mmol/L (ref 3.5–5.1)
Sodium: 139 mmol/L (ref 135–145)

## 2021-12-14 NOTE — Progress Notes (Signed)
PCP: [name], FNP ?Cardiology: Dr. Aundra Dubin ? ?71 y.o. with history of NSTEMI and ischemic cardiomyopathy presents for followup of CHF and CAD.  Patient was admitted in 7/21 with NSTEMI.  Cath showed LAD culprit, patient had DES x 2 to proximal LAD.  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 Echo was done showing EF 25-30%, septal akinesis. Patient was started on cardiac meds and was discharged home with a Lifevest. A new diagnosis of diabetes was made and she was started on metformin.  ? ?Echo in 10/21 showed EF up to 55-60% with normal RV. Echo in 2/23 showed EF 55-60%, apical hypokinesis, normal RV, mild AI, IVC normal.  ? ?She returns for followup of CHF and CAD.  Main complaint is low back pain.  Rare atypical chest pain ("twinges").  No significant exertional dyspnea.  No orthopnea/PND.   ? ?Labs (7/21): K 4.1, creatinine 0.87 => 0.98 ?Labs (9/21): BNP 59, K 4.1, creatinine 1.2, LDL 37, TGs 197, HDL 27 ?Labs (10/22): K 5.3, creatinine 1.03, LDL 21, TGs 148 ?Labs (4/23): K 4.4, creatinine 1.1, LDL 33, TGs 177 ? ?ECG (personally reviewed): NSR, septal Qs ? ?PMH: ?1. Type 2 diabetes: New diagnosis in 7/21.  ?2. Hyperlipidemia ?3. Chronic systolic CHF: Ischemic cardiomyopathy.   ?- Echo (7/21): EF 25-30%, septal akinesis.   ?- Echo (10/21): EF 55-60%, normal RV, mild MR.  ?- Echo (2/23): EF 55-60%, apical hypokinesis, normal RV, mild AI, IVC normal.  ?4. CAD: NSTEMI in 7/21.  ?- LHC: DES x 2 to proximal LAD, CTO RCA with collaterals, moderate PLOM stenosis.  ?5. Syncope: x 2 in 10/22.  ?- Zio monitor (1/23): No worrisome arrhythmias.  ? ?Social History  ? ?Socioeconomic History  ? Marital status: Married  ?  Spouse name: Not on file  ? Number of children: 1  ? Years of education: Not on file  ? Highest education level: Not on file  ?Occupational History  ? Occupation: retired  ?Tobacco Use  ? Smoking status: Never  ? Smokeless tobacco: Never  ?Substance and Sexual  Activity  ? Alcohol use: No  ? Drug use: No  ? Sexual activity: Not on file  ?Other Topics Concern  ? Not on file  ?Social History Narrative  ? Son living with them right now  ? ?Social Determinants of Health  ? ?Financial Resource Strain: Medium Risk  ? Difficulty of Paying Living Expenses: Somewhat hard  ?Food Insecurity: Food Insecurity Present  ? Worried About Charity fundraiser in the Last Year: Often true  ? Ran Out of Food in the Last Year: Sometimes true  ?Transportation Needs: No Transportation Needs  ? Lack of Transportation (Medical): No  ? Lack of Transportation (Non-Medical): No  ?Physical Activity: Insufficiently Active  ? Days of Exercise per Week: 5 days  ? Minutes of Exercise per Session: 10 min  ?Stress: Stress Concern Present  ? Feeling of Stress : To some extent  ?Social Connections: Socially Integrated  ? Frequency of Communication with Friends and Family: Twice a week  ? Frequency of Social Gatherings with Friends and Family: Twice a week  ? Attends Religious Services: More than 4 times per year  ? Active Member of Clubs or Organizations: Yes  ? Attends Archivist Meetings: 1 to 4 times per year  ? Marital Status: Married  ?Intimate Partner Violence: Not At Risk  ? Fear of Current or Ex-Partner: No  ? Emotionally Abused: No  ?  Physically Abused: No  ? Sexually Abused: No  ? ?Family History  ?Problem Relation Age of Onset  ? Alzheimer's disease Mother   ? Cancer Father   ? Diabetes Son   ? High blood pressure Son   ? ?ROS: All systems reviewed and negative except as noted in HPI.  ? ?Current Outpatient Medications  ?Medication Sig Dispense Refill  ? atorvastatin (LIPITOR) 80 MG tablet TAKE ONE TABLET AT BEDTIME 90 tablet 0  ? carvedilol (COREG) 12.5 MG tablet TAKE 1 TABLET 2 TIMES A DAY WITH A MEAL 180 tablet 0  ? cetirizine (ZYRTEC) 10 MG tablet Take 10 mg by mouth as needed for allergies.    ? clopidogrel (PLAVIX) 75 MG tablet Take 1 tablet (75 mg total) by mouth daily. 30 tablet 6   ? dapagliflozin propanediol (FARXIGA) 10 MG TABS tablet Take 1 tablet (10 mg total) by mouth daily before breakfast. 90 tablet 3  ? furosemide (LASIX) 20 MG tablet Take 1 tablet (20 mg total) by mouth daily as needed. 30 tablet 5  ? Lancets (ONETOUCH DELICA PLUS [id number]) MISC Apply 1 each topically daily. 1 each 2  ? metFORMIN (GLUCOPHAGE) 500 MG tablet TAKE ONE TABLET TWICE DAILY CF 180 tablet 0  ? nitroGLYCERIN (NITROSTAT) 0.4 MG SL tablet Place 1 tablet (0.4 mg total) under the tongue every 5 (five) minutes x 3 doses as needed for chest pain. 25 tablet 2  ? ONETOUCH VERIO test strip CHECK BLOOD SUGAR ONCE DAILY OR AS DIRECTED 50 strip 0  ? sacubitril-valsartan (ENTRESTO) 97-103 MG Take 1 tablet by mouth 2 (two) times daily. 180 tablet 3  ? sodium chloride (OCEAN) 0.65 % SOLN nasal spray Place 1 spray into both nostrils as needed for congestion. 30 mL 3  ? spironolactone (ALDACTONE) 25 MG tablet Take 1 tablet (25 mg total) by mouth daily. 90 tablet 1  ? ?No current facility-administered medications for this encounter.  ? ?BP 120/78   Pulse 69   Wt 77.3 kg (170 lb 6.4 oz)   SpO2 95%   BMI 29.25 kg/m?  ?General: NAD ?Neck: No JVD, no thyromegaly or thyroid nodule.  ?Lungs: Clear to auscultation bilaterally with normal respiratory effort. ?CV: Nondisplaced PMI.  Heart regular S1/S2, no S3/S4, no murmur.  No peripheral edema.  No carotid bruit.  Normal pedal pulses.  ?Abdomen: Soft, nontender, no hepatosplenomegaly, no distention.  ?Skin: Intact without lesions or rashes.  ?Neurologic: Alert and oriented x 3.  ?Psych: Normal affect. ?Extremities: No clubbing or cyanosis.  ?HEENT: Normal.  ? ?Assessment/Plan: ?1. CAD: NSTEMI in 7/21 with DES x 2 to proximal LAD.  There was a chronic total occlusion of the RCA and moderate stenosis in the PLOM.  No further chest pain.  ?- Continue Plavix 75 mg daily.    ?- Atorvastatin 80 mg daily, good LDL in 4/23.  ?2. Chronic systolic CHF: Ischemic cardiomyopathy.  Echo in  7/21 with EF 25-30%, akinetic septum.  Echo in 10/21 with EF up to 55-60%.  Echo in 2/23 showed EF 55-60%, apical hypokinesis, normal RV, mild AI, IVC normal.  She is not volume overloaded on exam, NYHA class I-II.  ?- Continue Entresto 49/51 bid.  ?- Continue Coreg 12.5 mg bid.  ?- Continue spironolactone 25 mg daily. BMET today.  ?- Continue Farxiga 10 mg daily.  ?3. Syncope: 2 episodes of syncope in 10/22 worrisome for arrhythmia.  However, she has had no episodes since that time and did not seek medical care at  the time. Zio monitor 1/23 with no significant arrhythmias.  ? ?Followup 6 months with APP.   ? ?Loralie Champagne ?12/14/2021 ? ? ?

## 2021-12-14 NOTE — Patient Instructions (Signed)
Good to see you today! ? ?Labs done today, your results will be available in MyChart, we will contact you for abnormal readings. ? ?Your physician recommends that you schedule a follow-up appointment in: 6 months November. Call in September to schedule an appointment ? ?If you have any questions or concerns before your next appointment please send [place] a message through [place] or call our office at [phone].   ? ?TO LEAVE A MESSAGE FOR THE NURSE SELECT OPTION 2, PLEASE LEAVE A MESSAGE INCLUDING: ?YOUR NAME ?DATE OF BIRTH ?CALL BACK NUMBER ?REASON FOR CALL**this is important as we prioritize the call backs ? ?YOU WILL RECEIVE A CALL BACK THE SAME DAY AS LONG AS YOU CALL BEFORE 4:00 PM ? ?At the [facility], you and your health needs are our priority. As part of our continuing mission to provide you with exceptional heart care, we have created designated Provider Care Teams. These Care Teams include your primary Cardiologist (physician) and Advanced Practice Providers (APPs- Physician Assistants and Nurse Practitioners) who all work together to provide you with the care you need, when you need it.  ? ?You may see any of the following providers on your designated Care Team at your next follow up: ?Dr Glori Bickers ?Dr Loralie Champagne ?Darrick Grinder, NP ?Lyda Jester, PA ?Jessica Milford,NP ?Marlyce Huge, PA ?Audry Riles, PharmD ? ? ?Please be sure to bring in all your medications bottles to every appointment.  ? ? ?

## 2021-12-30 ENCOUNTER — Encounter (HOSPITAL_COMMUNITY): Payer: No Typology Code available for payment source | Admitting: Cardiology

## 2022-01-19 ENCOUNTER — Other Ambulatory Visit: Payer: Self-pay | Admitting: Family Medicine

## 2022-01-19 ENCOUNTER — Other Ambulatory Visit (HOSPITAL_COMMUNITY): Payer: Self-pay | Admitting: Cardiology

## 2022-01-19 ENCOUNTER — Other Ambulatory Visit: Payer: Self-pay | Admitting: *Deleted

## 2022-01-19 DIAGNOSIS — I252 Old myocardial infarction: Secondary | ICD-10-CM

## 2022-01-19 DIAGNOSIS — E1165 Type 2 diabetes mellitus with hyperglycemia: Secondary | ICD-10-CM

## 2022-01-19 MED ORDER — ATORVASTATIN CALCIUM 80 MG PO TABS: 80.0000 mg | ORAL_TABLET | Freq: Every day | ORAL | 0 refills | Status: DC

## 2022-01-19 NOTE — Telephone Encounter (Signed)
Fax from [facility]: Atorvastatin 80 mg Pt will be due for RF on 01/29/22 RF sent to [facility] next [place] in October Fax sent back to [facility] w/ this information as directed

## 2022-01-20 ENCOUNTER — Other Ambulatory Visit: Payer: Self-pay | Admitting: *Deleted

## 2022-01-20 DIAGNOSIS — E1165 Type 2 diabetes mellitus with hyperglycemia: Secondary | ICD-10-CM

## 2022-01-20 MED ORDER — METFORMIN HCL 500 MG PO TABS: ORAL_TABLET | ORAL | 0 refills | Status: DC

## 2022-03-03 ENCOUNTER — Other Ambulatory Visit (HOSPITAL_COMMUNITY): Payer: Self-pay | Admitting: Cardiology

## 2022-03-06 ENCOUNTER — Encounter: Payer: Self-pay | Admitting: *Deleted

## 2022-04-19 ENCOUNTER — Other Ambulatory Visit: Payer: Self-pay | Admitting: *Deleted

## 2022-04-19 DIAGNOSIS — E1165 Type 2 diabetes mellitus with hyperglycemia: Secondary | ICD-10-CM

## 2022-04-19 DIAGNOSIS — I252 Old myocardial infarction: Secondary | ICD-10-CM

## 2022-04-19 MED ORDER — ATORVASTATIN CALCIUM 80 MG PO TABS: 80.0000 mg | ORAL_TABLET | Freq: Every day | ORAL | 0 refills | Status: DC

## 2022-04-19 NOTE — Telephone Encounter (Signed)
Fax from [facility] RF Atorvastatin to pt's pharmacy, sent to [facility], Next OV 05/05/22, fax sent back that RF has been sent.

## 2022-04-20 ENCOUNTER — Other Ambulatory Visit: Payer: Self-pay | Admitting: *Deleted

## 2022-04-20 DIAGNOSIS — E1165 Type 2 diabetes mellitus with hyperglycemia: Secondary | ICD-10-CM

## 2022-04-20 MED ORDER — METFORMIN HCL 500 MG PO TABS: ORAL_TABLET | ORAL | 0 refills | Status: DC

## 2022-04-20 NOTE — Telephone Encounter (Signed)
Fax from [facility] RF Metformin to pt's pharmacy, sent to [facility], Next OV 05/05/22, fax sent back that RF has been sent.

## 2022-05-05 ENCOUNTER — Ambulatory Visit (INDEPENDENT_AMBULATORY_CARE_PROVIDER_SITE_OTHER): Payer: No Typology Code available for payment source | Admitting: Nurse Practitioner

## 2022-05-05 ENCOUNTER — Ambulatory Visit: Payer: No Typology Code available for payment source | Admitting: Family Medicine

## 2022-05-05 ENCOUNTER — Encounter: Payer: Self-pay | Admitting: Nurse Practitioner

## 2022-05-05 VITALS — BP 148/85 | HR 73 | Temp 98.6°F | Ht 64.0 in | Wt 171.0 lb

## 2022-05-05 DIAGNOSIS — R7989 Other specified abnormal findings of blood chemistry: Secondary | ICD-10-CM | POA: Diagnosis not present

## 2022-05-05 DIAGNOSIS — G4709 Other insomnia: Secondary | ICD-10-CM | POA: Diagnosis not present

## 2022-05-05 DIAGNOSIS — E538 Deficiency of other specified B group vitamins: Secondary | ICD-10-CM

## 2022-05-05 DIAGNOSIS — E1165 Type 2 diabetes mellitus with hyperglycemia: Secondary | ICD-10-CM

## 2022-05-05 DIAGNOSIS — R6889 Other general symptoms and signs: Secondary | ICD-10-CM | POA: Diagnosis not present

## 2022-05-05 DIAGNOSIS — R5383 Other fatigue: Secondary | ICD-10-CM

## 2022-05-05 LAB — BAYER DCA HB A1C WAIVED: HB A1C (BAYER DCA - WAIVED): 7.1 % — ABNORMAL HIGH (ref 4.8–5.6)

## 2022-05-05 NOTE — Assessment & Plan Note (Signed)
Unresolved fatigue, completed B12 labs , results pending.

## 2022-05-05 NOTE — Assessment & Plan Note (Signed)
Labs completed results pending.  

## 2022-05-05 NOTE — Patient Instructions (Addendum)
Insomnia Insomnia is a sleep disorder that makes it difficult to fall asleep or stay asleep. Insomnia can cause fatigue, low energy, difficulty concentrating, mood swings, and poor performance at work or school. There are three different ways to classify insomnia: Difficulty falling asleep. Difficulty staying asleep. Waking up too early in the morning. Any type of insomnia can be long-term (chronic) or short-term (acute). Both are common. Short-term insomnia usually lasts for 3 months or less. Chronic insomnia occurs at least three times a week for longer than 3 months. What are the causes? Insomnia may be caused by another condition, situation, or substance, such as: Having certain mental health conditions, such as anxiety and depression. Using caffeine, alcohol, tobacco, or drugs. Having gastrointestinal conditions, such as gastroesophageal reflux disease (GERD). Having certain medical conditions. These include: Asthma. Alzheimer's disease. Stroke. Chronic pain. An overactive thyroid gland (hyperthyroidism). Other sleep disorders, such as restless legs syndrome and sleep apnea. Menopause. Sometimes, the cause of insomnia may not be known. What increases the risk? Risk factors for insomnia include: Gender. Females are affected more often than males. Age. Insomnia is more common as people get older. Stress and certain medical and mental health conditions. Lack of exercise. Having an irregular work schedule. This may include working night shifts and traveling between different time zones. What are the signs or symptoms? If you have insomnia, the main symptom is having trouble falling asleep or having trouble staying asleep. This may lead to other symptoms, such as: Feeling tired or having low energy. Feeling nervous about going to sleep. Not feeling rested in the morning. Having trouble concentrating. Feeling irritable, anxious, or depressed. How is this diagnosed? This condition  may be diagnosed based on: Your symptoms and medical history. Your health care provider may ask about: Your sleep habits. Any medical conditions you have. Your mental health. A physical exam. How is this treated? Treatment for insomnia depends on the cause. Treatment may focus on treating an underlying condition that is causing the insomnia. Treatment may also include: Medicines to help you sleep. Counseling or therapy. Lifestyle adjustments to help you sleep better. Follow these instructions at home: Eating and drinking  Limit or avoid alcohol, caffeinated beverages, and products that contain nicotine and tobacco, especially close to bedtime. These can disrupt your sleep. Do not eat a large meal or eat spicy foods right before bedtime. This can lead to digestive discomfort that can make it hard for you to sleep. Sleep habits  Keep a sleep diary to help you and your health care provider figure out what could be causing your insomnia. Write down: When you sleep. When you wake up during the night. How well you sleep and how rested you feel the next day. Any side effects of medicines you are taking. What you eat and drink. Make your bedroom a dark, comfortable place where it is easy to fall asleep. Put up shades or blackout curtains to block light from outside. Use a white noise machine to block noise. Keep the temperature cool. Limit screen use before bedtime. This includes: Not watching TV. Not using your smartphone, tablet, or computer. Stick to a routine that includes going to bed and waking up at the same times every day and night. This can help you fall asleep faster. Consider making a quiet activity, such as reading, part of your nighttime routine. Try to avoid taking naps during the day so that you sleep better at night. Get out of bed if you are still awake after   15 minutes of trying to sleep. Keep the lights down, but try reading or doing a quiet activity. When you feel  sleepy, go back to bed. General instructions Take over-the-counter and prescription medicines only as told by your health care provider. Exercise regularly as told by your health care provider. However, avoid exercising in the hours right before bedtime. Use relaxation techniques to manage stress. Ask your health care provider to suggest some techniques that may work well for you. These may include: Breathing exercises. Routines to release muscle tension. Visualizing peaceful scenes. Make sure that you drive carefully. Do not drive if you feel very sleepy. Keep all follow-up visits. This is important. Contact a health care provider if: You are tired throughout the day. You have trouble in your daily routine due to sleepiness. You continue to have sleep problems, or your sleep problems get worse. Get help right away if: You have thoughts about hurting yourself or someone else. Get help right away if you feel like you may hurt yourself or others, or have thoughts about taking your own life. Go to your nearest emergency room or: Call 911. Call the [place] at [phone] or 988. This is open 24 hours a day. Text the Crisis Text Line at 903-447-3015. Summary Insomnia is a sleep disorder that makes it difficult to fall asleep or stay asleep. Insomnia can be long-term (chronic) or short-term (acute). Treatment for insomnia depends on the cause. Treatment may focus on treating an underlying condition that is causing the insomnia. Keep a sleep diary to help you and your health care provider figure out what could be causing your insomnia. This information is not intended to replace advice given to you by your health care provider. Make sure you discuss any questions you have with your health care provider. Document Revised: 06/27/2021 Document Reviewed: 06/27/2021 Elsevier Patient Education  [place]. Fatigue If you have fatigue, you feel tired all the time and  have a lack of energy or a lack of motivation. Fatigue may make it difficult to start or complete tasks because of exhaustion. Occasional or mild fatigue is often a normal response to activity or life. However, long-term (chronic) or extreme fatigue may be a symptom of a medical condition such as: Depression. Not having enough red blood cells or hemoglobin in the blood (anemia). A problem with a small gland located in the lower front part of the neck (thyroid disorder). Rheumatologic conditions. These are problems related to the body's defense system (immune system). Infections, especially certain viral infections. Fatigue can also lead to negative health outcomes over time. Follow these instructions at home: Medicines Take over-the-counter and prescription medicines only as told by your health care provider. Take a multivitamin if told by your health care provider. Do not use herbal or dietary supplements unless they are approved by your health care provider. Eating and drinking  Avoid heavy meals in the evening. Eat a well-balanced diet, which includes lean proteins, whole grains, plenty of fruits and vegetables, and low-fat dairy products. Avoid eating or drinking too many products with caffeine in them. Avoid alcohol. Drink enough fluid to keep your urine pale yellow. Activity  Exercise regularly, as told by your health care provider. Use or practice techniques to help you relax, such as yoga, tai chi, meditation, or massage therapy. Lifestyle Change situations that cause you stress. Try to keep your work and personal schedules in balance. Do not use recreational or illegal drugs. General instructions Monitor your fatigue  for any changes. Go to bed and get up at the same time every day. Avoid fatigue by pacing yourself during the day and getting enough sleep at night. Maintain a healthy weight. Contact a health care provider if: Your fatigue does not get better. You have a  fever. You suddenly lose or gain weight. You have headaches. You have trouble falling asleep or sleeping through the night. You feel angry, guilty, anxious, or sad. You have swelling in your legs or another part of your body. Get help right away if: You feel confused, feel like you might faint, or faint. Your vision is blurry or you have a severe headache. You have severe pain in your abdomen, your back, or the area between your waist and hips (pelvis). You have chest pain, shortness of breath, or an irregular or fast heartbeat. You are unable to urinate, or you urinate less than normal. You have abnormal bleeding from the rectum, nose, lungs, nipples, or, if you are female, the vagina. You vomit blood. You have thoughts about hurting yourself or others. These symptoms may be an emergency. Get help right away. Call 911. Do not wait to see if the symptoms will go away. Do not drive yourself to the hospital. Get help right away if you feel like you may hurt yourself or others, or have thoughts about taking your own life. Go to your nearest emergency room or: Call 911. Call the [place] at [phone] or 988. This is open 24 hours a day. Text the Crisis Text Line at 218-730-9258. Summary If you have fatigue, you feel tired all the time and have a lack of energy or a lack of motivation. Fatigue may make it difficult to start or complete tasks because of exhaustion. Long-term (chronic) or extreme fatigue may be a symptom of a medical condition. Exercise regularly, as told by your health care provider. Change situations that cause you stress. Try to keep your work and personal schedules in balance. This information is not intended to replace advice given to you by your health care provider. Make sure you discuss any questions you have with your health care provider. Document Revised: 05/09/2021 Document Reviewed: 05/09/2021 Elsevier Patient Education  [place].

## 2022-05-05 NOTE — Progress Notes (Signed)
Established Patient Office Visit  Subjective   Patient ID: Becky Boyle, female    DOB: 06/17/1951  Age: 71 y.o. MRN: [id number]  Chief Complaint  Patient presents with   Medical Management of Chronic Issues    6 month    Insomnia    Pt states this has been going on for about a month now    Anxiety    Pt has a lot of family stress going on recently       The patient presents with history of type 2 diabetes mellitus without complications. Patient was diagnosed in 03/22/2020. Compliance with treatment has been good; the patient takes medication as directed , maintains a diabetic diet and an exercise regimen , follows up as directed , and is keeping a glucose diary. Patient specifically denies associated symptoms, including blurred vision, fatigue, polydipsia, polyphagia and polyuria . Patient denies hypoglycemia. In regard to preventative care, the patient performs foot self-exams daily and ophthalmology exam .   Insomnia Primary symptoms: sleep disturbance, difficulty falling asleep, malaise/fatigue.   The current episode started one month. The onset quality is gradual. The problem occurs every several days. The problem is unchanged. The symptoms are aggravated by family issues. Nothing relieves the symptoms. Past treatments include meditation (Melatonin). The treatment provided mild relief. Typical bedtime:  Other.  How long after going to bed to you fall asleep: over an hour.   Duration of naps:  Two to four hours.  PMH includes: hypertension.  Prior diagnostic workup includes:  No prior workup.  Anxiety Presents for initial visit. Onset was 1 to 4 weeks ago. The problem has been unchanged. Symptoms include insomnia and nervous/anxious behavior. Patient reports no chest pain, compulsions, confusion or decreased concentration. Symptoms occur constantly. Nothing aggravates the symptoms. The quality of sleep is poor. Nighttime awakenings: several.   Past treatments include nothing.     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[place])    CKD (chronic kidney disease) stage 3, GFR 30-59 ml/min (HCC)    Depression    Diabetes ([place])    Generalized anxiety disorder with panic attacks 09/24/2020   Heart failure ([place])    History of non-ST elevation myocardial infarction (NSTEMI)    Vitamin B12 deficiency 11/04/2021   Past Surgical History:  Procedure Laterality Date   ABDOMINAL HYSTERECTOMY     BRAIN SURGERY  1989   tumor    CORONARY STENT INTERVENTION N/A 02/17/2020   Procedure: CORONARY STENT INTERVENTION;  Surgeon: Jettie Booze, MD;  Location: [facility];  Service: Cardiovascular;  Laterality: N/A;   RIGHT/LEFT HEART CATH AND CORONARY ANGIOGRAPHY N/A 02/17/2020   Procedure: RIGHT/LEFT HEART CATH AND CORONARY ANGIOGRAPHY;  Surgeon: Jettie Booze, MD;  Location: [facility];  Service: Cardiovascular;  Laterality: N/A;   Social History   Tobacco Use   Smoking status: Never   Smokeless tobacco: Never  Substance Use Topics   Alcohol use: No   Drug  use: No   Social History   Socioeconomic History   Marital status: Married    Spouse name: Not on file   Number of children: 1   Years of education: Not on file   Highest education level: Not on file  Occupational History   Occupation: retired  Tobacco Use   Smoking status: Never   Smokeless tobacco: Never  Substance and Sexual Activity   Alcohol use: No   Drug use: No   Sexual activity: Not on file   Other Topics Concern   Not on file  Social History Narrative   Son living with them right now   Social Determinants of Health   Financial Resource Strain: Medium Risk (07/15/2021)   Overall Financial Resource Strain (CARDIA)    Difficulty of Paying Living Expenses: Somewhat hard  Food Insecurity: Food Insecurity Present (07/15/2021)   Hunger Vital Sign    Worried About [place] in the Last Year: Often true    Ran Out of Food in the Last Year: Sometimes true  Transportation Needs: No Transportation Needs (07/15/2021)   PRAPARE - Hydrologist (Medical): No    Lack of Transportation (Non-Medical): No  Physical Activity: Insufficiently Active (07/15/2021)   Exercise Vital Sign    Days of Exercise per Week: 5 days    Minutes of Exercise per Session: 10 min  Stress: Stress Concern Present (07/15/2021)   [place]    Feeling of Stress : To some extent  Social Connections: Socially Integrated (07/15/2021)   Social Connection and Isolation Panel [NHANES]    Frequency of Communication with Friends and Family: Twice a week    Frequency of Social Gatherings with Friends and Family: Twice a week    Attends Religious Services: More than 4 times per year    Active Member of [employer] or Organizations: Yes    Attends Archivist Meetings: 1 to 4 times per year    Marital Status: Married  Human resources officer Violence: Not At Risk (07/15/2021)   Humiliation, Afraid, Rape, and Kick questionnaire    Fear of Current or Ex-Partner: No    Emotionally Abused: No    Physically Abused: No    Sexually Abused: No   Family Status  Relation Name Status   Mother  Deceased   Father  Deceased   Son  Alive   Son  Alive   Family History  Problem Relation Age of Onset   Alzheimer's disease Mother    Cancer Father    Diabetes Son    High blood pressure Son    Allergies  Allergen Reactions    Latex Anaphylaxis   Penicillins Hives   Codeine Rash   Tape Rash      Review of Systems  Constitutional:  Positive for malaise/fatigue.  HENT: Negative.    Eyes: Negative.   Respiratory: Negative.    Cardiovascular: Negative.  Negative for chest pain.  Genitourinary: Negative.   Musculoskeletal: Negative.   Skin: Negative.  Negative for itching and rash.  Psychiatric/Behavioral:  Positive for sleep disturbance. Negative for confusion and decreased concentration. The patient is nervous/anxious and has insomnia.   All other systems reviewed and are negative.     Objective:     BP (!) 148/85   Pulse 73   Temp 98.6 F (37 C)   Ht '5\' 4"'  (1.626 m)   Wt 171 lb (77.6 kg)   SpO2 97%  BMI 29.35 kg/m  BP Readings from Last 3 Encounters:  05/05/22 (!) 148/85  12/14/21 120/78  11/03/21 123/71   Wt Readings from Last 3 Encounters:  05/05/22 171 lb (77.6 kg)  12/14/21 170 lb 6.4 oz (77.3 kg)  11/03/21 166 lb 9.6 oz (75.6 kg)      Physical Exam Vitals reviewed.  Constitutional:      Appearance: Normal appearance.  HENT:     Head: Normocephalic.     Right Ear: External ear normal.     Left Ear: External ear normal.     Nose: Nose normal.     Mouth/Throat:     Mouth: Mucous membranes are moist.     Pharynx: Oropharynx is clear.  Eyes:     Conjunctiva/sclera: Conjunctivae normal.  Cardiovascular:     Rate and Rhythm: Normal rate and regular rhythm.     Pulses: Normal pulses.     Heart sounds: Normal heart sounds.  Abdominal:     General: Bowel sounds are normal.  Skin:    General: Skin is warm.     Findings: No erythema or rash.  Neurological:     General: No focal deficit present.     Mental Status: She is alert and oriented to person, place, and time.  Psychiatric:        Behavior: Behavior normal.      No results found for any visits on 05/05/22.  Last CBC Lab Results  Component Value Date   WBC 6.9 11/03/2021   HGB 12.6 11/03/2021   HCT 36.8  11/03/2021   MCV 90 11/03/2021   MCH 30.8 11/03/2021   RDW 12.3 11/03/2021   PLT 221 63/87/5643   Last metabolic panel Lab Results  Component Value Date   GLUCOSE 155 (H) 12/14/2021   NA 139 12/14/2021   K 4.1 12/14/2021   CL 109 12/14/2021   CO2 23 12/14/2021   BUN 23 12/14/2021   CREATININE 0.84 12/14/2021   GFRNONAA >60 12/14/2021   CALCIUM 9.0 12/14/2021   PROT 7.2 11/03/2021   ALBUMIN 4.7 11/03/2021   LABGLOB 2.5 11/03/2021   AGRATIO 1.9 11/03/2021   BILITOT 0.8 11/03/2021   ALKPHOS 95 11/03/2021   AST 11 11/03/2021   ALT 12 11/03/2021   ANIONGAP 7 12/14/2021   Last lipids Lab Results  Component Value Date   CHOL 89 (L) 11/03/2021   HDL 27 (L) 11/03/2021   LDLCALC 33 11/03/2021   TRIG 177 (H) 11/03/2021   CHOLHDL 3.3 11/03/2021   Last hemoglobin A1c Lab Results  Component Value Date   HGBA1C 6.9 (H) 11/03/2021   Last thyroid functions Lab Results  Component Value Date   TSH 5.800 (H) 11/07/2021   Last vitamin D No results found for: "25OHVITD2", "25OHVITD3", "VD25OH" Last vitamin B12 and Folate Lab Results  Component Value Date   VITAMINB12 214 (L) 11/03/2021      The ASCVD Risk score (Arnett DK, et al., 2019) failed to calculate for the following reasons:   The patient has a prior MI or stroke diagnosis    Assessment & Plan:   Problem List Items Addressed This Visit       Endocrine   Type 2 diabetes mellitus with hyperglycemia, without long-term current use of insulin ([place]) - Primary    Completed labs results pending. No new or worsening signs of hyperglycemia. F/u in  Six months. Continue low sodium diet, and exeresis as tolerated.       Relevant Orders   CBC  with Differential/Platelet   CMP14+EGFR   Lipid panel   Bayer DCA Hb A1c Waived   VITAMIN D 25 Hydroxy (Vit-D Deficiency, Fractures)     Other   Elevated TSH    Labs completed results pending      Relevant Orders   Thyroid Panel With TSH   Vitamin B12 deficiency    Labs  completed results pending      Relevant Orders   Vitamin B12   Other fatigue    Unresolved fatigue, completed B12 labs , results pending.       Other Visit Diagnoses     Other insomnia           Return in about 6 months (around 11/04/2022) for chronic disease management.    Ivy Lynn, NP

## 2022-05-05 NOTE — Assessment & Plan Note (Signed)
Completed labs results pending. No new or worsening signs of hyperglycemia. F/u in  Six months. Continue low sodium diet, and exeresis as tolerated.

## 2022-05-08 ENCOUNTER — Other Ambulatory Visit: Payer: Self-pay | Admitting: Nurse Practitioner

## 2022-05-08 DIAGNOSIS — E559 Vitamin D deficiency, unspecified: Secondary | ICD-10-CM

## 2022-05-08 DIAGNOSIS — E782 Mixed hyperlipidemia: Secondary | ICD-10-CM

## 2022-05-08 DIAGNOSIS — E039 Hypothyroidism, unspecified: Secondary | ICD-10-CM

## 2022-05-08 MED ORDER — ATORVASTATIN CALCIUM 40 MG PO TABS: 40.0000 mg | ORAL_TABLET | Freq: Every day | ORAL | 0 refills | Status: DC

## 2022-05-08 MED ORDER — LEVOTHYROXINE SODIUM 25 MCG PO TABS: 25.0000 ug | ORAL_TABLET | Freq: Every day | ORAL | 1 refills | Status: DC

## 2022-05-08 MED ORDER — VITAMIN D3 50 MCG (2000 UT) PO CAPS: 2000.0000 [IU] | ORAL_CAPSULE | Freq: Every day | ORAL | 0 refills | Status: DC

## 2022-05-08 NOTE — Progress Notes (Signed)
Medication sent to pharmacy, follow up in 3 months to repeat labs

## 2022-05-10 ENCOUNTER — Other Ambulatory Visit: Payer: Self-pay | Admitting: Nurse Practitioner

## 2022-05-10 LAB — CMP14+EGFR
ALT: 13 IU/L (ref 0–32)
AST: 12 IU/L (ref 0–40)
Albumin/Globulin Ratio: 1.6 (ref 1.2–2.2)
Albumin: 4.4 g/dL (ref 3.9–4.9)
Alkaline Phosphatase: 103 IU/L (ref 44–121)
BUN/Creatinine Ratio: 25 (ref 12–28)
BUN: 22 mg/dL (ref 8–27)
Bilirubin Total: 0.5 mg/dL (ref 0.0–1.2)
CO2: 20 mmol/L (ref 20–29)
Calcium: 9.7 mg/dL (ref 8.7–10.3)
Chloride: 104 mmol/L (ref 96–106)
Creatinine, Ser: 0.87 mg/dL (ref 0.57–1.00)
Globulin, Total: 2.7 g/dL (ref 1.5–4.5)
Glucose: 130 mg/dL — ABNORMAL HIGH (ref 70–99)
Potassium: 4.8 mmol/L (ref 3.5–5.2)
Sodium: 140 mmol/L (ref 134–144)
Total Protein: 7.1 g/dL (ref 6.0–8.5)
eGFR: 72 mL/min/{1.73_m2} (ref >59)

## 2022-05-10 LAB — CBC WITH DIFFERENTIAL/PLATELET
Basophils Absolute: 0.1 10*3/uL (ref 0.0–0.2)
Basos: 1 %
EOS (ABSOLUTE): 0.2 10*3/uL (ref 0.0–0.4)
Eos: 3 %
Hematocrit: 38.6 % (ref 34.0–46.6)
Hemoglobin: 12.7 g/dL (ref 11.1–15.9)
Immature Grans (Abs): 0 10*3/uL (ref 0.0–0.1)
Immature Granulocytes: 1 %
Lymphocytes Absolute: 1.7 10*3/uL (ref 0.7–3.1)
Lymphs: 22 %
MCH: 29.3 pg (ref 26.6–33.0)
MCHC: 32.9 g/dL (ref 31.5–35.7)
MCV: 89 fL (ref 79–97)
Monocytes Absolute: 0.6 10*3/uL (ref 0.1–0.9)
Monocytes: 8 %
Neutrophils Absolute: 5 10*3/uL (ref 1.4–7.0)
Neutrophils: 65 %
Platelets: 240 10*3/uL (ref 150–450)
RBC: 4.33 x10E6/uL (ref 3.77–5.28)
RDW: 12.6 % (ref 11.7–15.4)
WBC: 7.5 10*3/uL (ref 3.4–10.8)

## 2022-05-10 LAB — LIPID PANEL
Chol/HDL Ratio: 3.2 ratio (ref 0.0–4.4)
Cholesterol, Total: 87 mg/dL — ABNORMAL LOW (ref 100–199)
HDL: 27 mg/dL — ABNORMAL LOW (ref >39)
LDL Chol Calc (NIH): 35 mg/dL (ref 0–99)
Triglycerides: 140 mg/dL (ref 0–149)
VLDL Cholesterol Cal: 25 mg/dL (ref 5–40)

## 2022-05-10 LAB — THYROID PANEL WITH TSH
Free Thyroxine Index: 1.8 (ref 1.2–4.9)
T3 Uptake Ratio: 25 % (ref 24–39)
T4, Total: 7 ug/dL (ref 4.5–12.0)
TSH: 9.56 u[IU]/mL — ABNORMAL HIGH (ref 0.450–4.500)

## 2022-05-10 LAB — VITAMIN D 25 HYDROXY (VIT D DEFICIENCY, FRACTURES): Vit D, 25-Hydroxy: 21.9 ng/mL — ABNORMAL LOW (ref 30.0–100.0)

## 2022-05-10 LAB — VITAMIN B12: Vitamin B-12: 966 pg/mL (ref 232–1245)

## 2022-06-12 ENCOUNTER — Telehealth (HOSPITAL_COMMUNITY): Payer: Self-pay

## 2022-06-12 ENCOUNTER — Other Ambulatory Visit (HOSPITAL_COMMUNITY): Payer: Self-pay

## 2022-06-12 ENCOUNTER — Other Ambulatory Visit (HOSPITAL_COMMUNITY): Payer: Self-pay | Admitting: *Deleted

## 2022-06-12 MED ORDER — ENTRESTO 97-103 MG PO TABS: 1.0000 | ORAL_TABLET | Freq: Two times a day (BID) | ORAL | 3 refills | Status: DC

## 2022-06-12 MED ORDER — DAPAGLIFLOZIN PROPANEDIOL 10 MG PO TABS: 10.0000 mg | ORAL_TABLET | Freq: Every day | ORAL | 3 refills | Status: DC

## 2022-06-12 NOTE — Telephone Encounter (Signed)
Advanced Heart Failure Patient Advocate Encounter  The patient was approved for a [employer] grant that will help cover the cost of Delene Loll, Farx[facility].  Total amount awarded, $10,000.  Effective: 05/13/2022 - 05/13/2023.  BIN [id number] PCN [id number] Group [id number] ID [id number]  New prescription(s) sent to [facility]. Patient provided with approval and processing information via email.  Clista Bernhardt, CPhT Rx Patient Advocate Phone: [phone]

## 2022-07-18 ENCOUNTER — Ambulatory Visit (INDEPENDENT_AMBULATORY_CARE_PROVIDER_SITE_OTHER): Payer: No Typology Code available for payment source

## 2022-07-18 VITALS — Ht 63.0 in | Wt 155.0 lb

## 2022-07-18 DIAGNOSIS — Z780 Asymptomatic menopausal state: Secondary | ICD-10-CM

## 2022-07-18 DIAGNOSIS — Z0000 Encounter for general adult medical examination without abnormal findings: Secondary | ICD-10-CM | POA: Diagnosis not present

## 2022-07-18 NOTE — Patient Instructions (Signed)
Becky Boyle , Thank you for taking time to come for your Medicare Wellness Visit. I appreciate your ongoing commitment to your health goals. Please review the following plan we discussed and let me know if I can assist you in the future.   These are the goals we discussed:  Goals      Patient Stated     07/13/2020 AWV Goal: Exercise for General Health  Patient will verbalize understanding of the benefits of increased physical activity: Exercising regularly is important. It will improve your overall fitness, flexibility, and endurance. Regular exercise also will improve your overall health. It can help you control your weight, reduce stress, and improve your bone density. Over the next year, patient will increase physical activity as tolerated with a goal of at least 150 minutes of moderate physical activity per week.  You can tell that you are exercising at a moderate intensity if your heart starts beating faster and you start breathing faster but can still hold a conversation. Moderate-intensity exercise ideas include: Walking 1 mile (1.6 km) in about 15 minutes Biking Hiking Golfing Dancing Water aerobics Patient will verbalize understanding of everyday activities that increase physical activity by providing examples like the following: Yard work, such as: Sales promotion account executive Gardening Washing windows or floors Patient will be able to explain general safety guidelines for exercising:  Before you start a new exercise program, talk with your health care provider. Do not exercise so much that you hurt yourself, feel dizzy, or get very short of breath. Wear comfortable clothes and wear shoes with good support. Drink plenty of water while you exercise to prevent dehydration or heat stroke. Work out until your breathing and your heartbeat get faster.         This is a list of the screening recommended for  you and due dates:  Health Maintenance  Topic Date Due   DTaP/Tdap/Td vaccine (1 - Tdap) Never done   DEXA scan (bone density measurement)  11/04/2022*   Hepatitis C Screening: USPSTF Recommendation to screen - Ages 18-79 yo.  11/04/2022*   Eye exam for diabetics  08/01/2022   Mammogram  08/23/2022   Yearly kidney health urinalysis for diabetes  11/04/2022   Complete foot exam   11/04/2022   Hemoglobin A1C  11/04/2022   Yearly kidney function blood test for diabetes  05/06/2023   Medicare Annual Wellness Visit  07/19/2023   Cologuard (Stool DNA test)  10/20/2023   Pneumonia Vaccine  Completed   Flu Shot  Completed   COVID-19 Vaccine  Completed   Zoster (Shingles) Vaccine  Completed   HPV Vaccine  Aged Out  *Topic was postponed. The date shown is not the original due date.    Advanced directives: Advance directive discussed with you today. I have provided a copy for you to complete at home and have notarized. Once this is complete please bring a copy in to our office so we can scan it into your chart.   Conditions/risks identified: Aim for 30 minutes of exercise or brisk walking, 6-8 glasses of water, and 5 servings of fruits and vegetables each day.   Next appointment: Follow up in one year for your annual wellness visit    Preventive Care 65 Years and Older, Female Preventive care refers to lifestyle choices and visits with your health care provider that can promote health and wellness. What does preventive care include? A yearly physical  exam. This is also called an annual well check. Dental exams once or twice a year. Routine eye exams. Ask your health care provider how often you should have your eyes checked. Personal lifestyle choices, including: Daily care of your teeth and gums. Regular physical activity. Eating a healthy diet. Avoiding tobacco and drug use. Limiting alcohol use. Practicing safe sex. Taking low-dose aspirin every day. Taking vitamin and mineral  supplements as recommended by your health care provider. What happens during an annual well check? The services and screenings done by your health care provider during your annual well check will depend on your age, overall health, lifestyle risk factors, and family history of disease. Counseling  Your health care provider may ask you questions about your: Alcohol use. Tobacco use. Drug use. Emotional well-being. Home and relationship well-being. Sexual activity. Eating habits. History of falls. Memory and ability to understand (cognition). Work and work Statistician. Reproductive health. Screening  You may have the following tests or measurements: Height, weight, and BMI. Blood pressure. Lipid and cholesterol levels. These may be checked every 5 years, or more frequently if you are over 33 years old. Skin check. Lung cancer screening. You may have this screening every year starting at age 27 if you have a 30-pack-year history of smoking and currently smoke or have quit within the past 15 years. Fecal occult blood test (FOBT) of the stool. You may have this test every year starting at age 53. Flexible sigmoidoscopy or colonoscopy. You may have a sigmoidoscopy every 5 years or a colonoscopy every 10 years starting at age 30. Hepatitis C blood test. Hepatitis B blood test. Sexually transmitted disease (STD) testing. Diabetes screening. This is done by checking your blood sugar (glucose) after you have not eaten for a while (fasting). You may have this done every 1-3 years. Bone density scan. This is done to screen for osteoporosis. You may have this done starting at age 61. Mammogram. This may be done every 1-2 years. Talk to your health care provider about how often you should have regular mammograms. Talk with your health care provider about your test results, treatment options, and if necessary, the need for more tests. Vaccines  Your health care provider may recommend certain  vaccines, such as: Influenza vaccine. This is recommended every year. Tetanus, diphtheria, and acellular pertussis (Tdap, Td) vaccine. You may need a Td booster every 10 years. Zoster vaccine. You may need this after age 55. Pneumococcal 13-valent conjugate (PCV13) vaccine. One dose is recommended after age 65. Pneumococcal polysaccharide (PPSV23) vaccine. One dose is recommended after age 52. Talk to your health care provider about which screenings and vaccines you need and how often you need them. This information is not intended to replace advice given to you by your health care provider. Make sure you discuss any questions you have with your health care provider. Document Released: 08/13/2015 Document Revised: 04/05/2016 Document Reviewed: 05/18/2015 Elsevier Interactive Patient Education  2017 [employer] Prevention in the Home Falls can cause injuries. They can happen to people of all ages. There are many things you can do to make your home safe and to help prevent falls. What can I do on the outside of my home? Regularly fix the edges of walkways and driveways and fix any cracks. Remove anything that might make you trip as you walk through a door, such as a raised step or threshold. Trim any bushes or trees on the path to your home. Use bright outdoor lighting.  Clear any walking paths of anything that might make someone trip, such as rocks or tools. Regularly check to see if handrails are loose or broken. Make sure that both sides of any steps have handrails. Any raised decks and porches should have guardrails on the edges. Have any leaves, snow, or ice cleared regularly. Use sand or salt on walking paths during winter. Clean up any spills in your garage right away. This includes oil or grease spills. What can I do in the bathroom? Use night lights. Install grab bars by the toilet and in the tub and shower. Do not use towel bars as grab bars. Use non-skid mats or decals in  the tub or shower. If you need to sit down in the shower, use a plastic, non-slip stool. Keep the floor dry. Clean up any water that spills on the floor as soon as it happens. Remove soap buildup in the tub or shower regularly. Attach bath mats securely with double-sided non-slip rug tape. Do not have throw rugs and other things on the floor that can make you trip. What can I do in the bedroom? Use night lights. Make sure that you have a light by your bed that is easy to reach. Do not use any sheets or blankets that are too big for your bed. They should not hang down onto the floor. Have a firm chair that has side arms. You can use this for support while you get dressed. Do not have throw rugs and other things on the floor that can make you trip. What can I do in the kitchen? Clean up any spills right away. Avoid walking on wet floors. Keep items that you use a lot in easy-to-reach places. If you need to reach something above you, use a strong step stool that has a grab bar. Keep electrical cords out of the way. Do not use floor polish or wax that makes floors slippery. If you must use wax, use non-skid floor wax. Do not have throw rugs and other things on the floor that can make you trip. What can I do with my stairs? Do not leave any items on the stairs. Make sure that there are handrails on both sides of the stairs and use them. Fix handrails that are broken or loose. Make sure that handrails are as long as the stairways. Check any carpeting to make sure that it is firmly attached to the stairs. Fix any carpet that is loose or worn. Avoid having throw rugs at the top or bottom of the stairs. If you do have throw rugs, attach them to the floor with carpet tape. Make sure that you have a light switch at the top of the stairs and the bottom of the stairs. If you do not have them, ask someone to add them for you. What else can I do to help prevent falls? Wear shoes that: Do not have high  heels. Have rubber bottoms. Are comfortable and fit you well. Are closed at the toe. Do not wear sandals. If you use a stepladder: Make sure that it is fully opened. Do not climb a closed stepladder. Make sure that both sides of the stepladder are locked into place. Ask someone to hold it for you, if possible. Clearly mark and make sure that you can see: Any grab bars or handrails. First and last steps. Where the edge of each step is. Use tools that help you move around (mobility aids) if they are needed. These include: Canes.  Walkers. Scooters. Crutches. Turn on the lights when you go into a dark area. Replace any light bulbs as soon as they burn out. Set up your furniture so you have a clear path. Avoid moving your furniture around. If any of your floors are uneven, fix them. If there are any pets around you, be aware of where they are. Review your medicines with your doctor. Some medicines can make you feel dizzy. This can increase your chance of falling. Ask your doctor what other things that you can do to help prevent falls. This information is not intended to replace advice given to you by your health care provider. Make sure you discuss any questions you have with your health care provider. Document Released: 05/13/2009 Document Revised: 12/23/2015 Document Reviewed: 08/21/2014 Elsevier Interactive Patient Education  2017 [employer].

## 2022-07-18 NOTE — Progress Notes (Addendum)
Subjective:   Becky Boyle is a 71 y.o. female who presents for Medicare Annual (Subsequent) preventive examination. I connected with  Treacy Holcomb Ripoll on 07/18/22 by a audio enabled telemedicine application and verified that I am speaking with the correct person using two identifiers.  Patient Location: Home  Provider Location: Home Office  I discussed the limitations of evaluation and management by telemedicine. The patient expressed understanding and agreed to proceed.  Review of Systems     Cardiac Risk Factors include: advanced age (>80mn, >>73women);diabetes mellitus;hypertension     Objective:    Today's Vitals   07/18/22 0951  Weight: 155 lb (70.3 kg)  Height: _0  (1.6 m)   Body mass index is 27.46 kg/m.     07/18/2022    9:58 AM 07/15/2021   10:27 AM 07/13/2020    9:49 AM 03/24/2016    9:01 AM  Advanced Directives  Does Patient Have a Medical Advance Directive? No No No No  Would patient like information on creating a medical advance directive? No - Patient declined No - Patient declined No - Patient declined No - patient declined information    Current Medications (verified) Outpatient Encounter Medications as of 07/18/2022  Medication Sig   atorvastatin (LIPITOR) 40 MG tablet Take 1 tablet (40 mg total) by mouth daily.   carvedilol (COREG) 12.5 MG tablet TAKE 1 TABLET 2 TIMES A DAY WITH A MEAL   cetirizine (ZYRTEC) 10 MG tablet Take 10 mg by mouth as needed for allergies.   Cholecalciferol (VITAMIN D3) 50 MCG (2000 UT) capsule Take 1 capsule (2,000 Units total) by mouth daily.   clopidogrel (PLAVIX) 75 MG tablet TAKE ONE TABLET ONCE DAILY   dapagliflozin propanediol (FARXIGA) 10 MG TABS tablet Take 1 tablet (10 mg total) by mouth daily before breakfast.   furosemide (LASIX) 20 MG tablet Take 1 tablet (20 mg total) by mouth daily as needed.   Lancets (ONETOUCH DELICA PLUS [id number] MISC Apply 1 each topically daily.   levothyroxine (SYNTHROID) 25 MCG  tablet Take 1 tablet (25 mcg total) by mouth daily before breakfast.   metFORMIN (GLUCOPHAGE) 500 MG tablet TAKE ONE TABLET TWICE DAILY CF   nitroGLYCERIN (NITROSTAT) 0.4 MG SL tablet Place 1 tablet (0.4 mg total) under the tongue every 5 (five) minutes x 3 doses as needed for chest pain.   ONETOUCH VERIO test strip CHECK BLOOD SUGAR ONCE DAILY OR AS DIRECTED   sacubitril-valsartan (ENTRESTO) 97-103 MG Take 1 tablet by mouth 2 (two) times daily.   sodium chloride (OCEAN) 0.65 % SOLN nasal spray Place 1 spray into both nostrils as needed for congestion.   spironolactone (ALDACTONE) 25 MG tablet Take 1 tablet (25 mg total) by mouth daily.   No facility-administered encounter medications on file as of 07/18/2022.    Allergies (verified) Latex, Penicillins, Codeine, and Tape   History: 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HCC)    CKD (chronic kidney disease) stage 3, GFR 30-59 ml/min (HCC)    Depression    [place]    Generalized anxiety disorder with panic attacks 09/24/2020   Heart [place] ([facility]    History of non-ST elevation myocardial infarction (NSTEMI)    Vitamin B12 deficiency 11/04/2021   Past Surgical History:  Procedure Laterality Date   ABDOMINAL HYSTERECTOMY     BRAIN SURGERY  1989   tumor    CORONARY STENT INTERVENTION N/A  02/17/2020   Procedure: CORONARY STENT INTERVENTION;  Surgeon: Jettie Booze, MD;  Location: [facility];  Service: Cardiovascular;  Laterality: N/A;   RIGHT/LEFT HEART CATH AND CORONARY ANGIOGRAPHY N/A 02/17/2020   Procedure: RIGHT/LEFT HEART CATH AND CORONARY ANGIOGRAPHY;  Surgeon: Jettie Booze, MD;  Location: [facility];  Service: Cardiovascular;  Laterality: N/A;   Family History  Problem Relation Age of Onset   Alzheimer's disease Mother    Cancer Father    Diabetes Son    High blood pressure Son    Social History   Socioeconomic History   Marital  status: Married    Spouse name: Not on file   Number of children: 1   Years of education: Not on file   Highest education level: Not on file  Occupational History   Occupation: retired  Tobacco Use   Smoking status: Never   Smokeless tobacco: Never  Substance and Sexual Activity   Alcohol use: No   Drug use: No   Sexual activity: Not on file  Other Topics Concern   Not on file  Social History Narrative   Son living with them right now   Social Determinants of Health   Financial Resource Strain: Low Risk  (07/18/2022)   Overall Financial Resource Strain (CARDIA)    Difficulty of Paying Living Expenses: Not hard at all  Food Insecurity: No Food Insecurity (07/18/2022)   Hunger Vital Sign    Worried About Running Out of Food in the Last Year: Never true    [place] in the Last Year: Never true  Transportation Needs: No Transportation Needs (07/18/2022)   PRAPARE - Hydrologist (Medical): No    Lack of Transportation (Non-Medical): No  Physical Activity: Insufficiently Active (07/18/2022)   Exercise Vital Sign    Days of Exercise per Week: 3 days    Minutes of Exercise per Session: 30 min  Stress: No Stress Concern Present (07/18/2022)   [place]    Feeling of Stress : Not at all  Social Connections: [place] (07/18/2022)   Social Connection and Isolation Panel [NHANES]    Frequency of Communication with Friends and Family: More than three times a week    Frequency of Social Gatherings with Friends and Family: More than three times a week    Attends Religious Services: More than 4 times per year    Active Member of [employer] or Organizations: Yes    Attends Archivist Meetings: 1 to 4 times per year    Marital Status: Married    Tobacco Counseling Counseling given: Not Answered   Clinical Intake:  Pre-visit preparation completed: Yes  Pain :  No/denies pain     Nutritional Risks: None Diabetes: No  How often do you need to have someone help you when you read instructions, pamphlets, or other written materials from your doctor or pharmacy?: 1 - Never  Diabetic?yes  Nutrition Risk Assessment:  Has the patient had any N/V/D within the last 2 months?  No  Does the patient have any non-healing wounds?  No  Has the patient had any unintentional weight loss or weight gain?  No   Diabetes:  Is the patient diabetic?  Yes  If diabetic, was a CBG obtained today?  No  Did the patient bring in their glucometer from home?  No  How often do you monitor your CBG's? 3 x day .  Financial Strains and Diabetes Management:  Are you having any financial strains with the device, your supplies or your medication? No .  Does the patient want to be seen by Chronic Care Management for management of their diabetes?  No  Would the patient like to be referred to a Nutritionist or for Diabetic Management?  No   Diabetic Exams:  Diabetic Eye Exam: Completed 07/2021 Diabetic Foot Exam: Overdue, Pt has been advised about the importance in completing this exam. Pt is scheduled for diabetic foot exam on next office visit .   Interpreter Needed?: No  Information entered by :: Jadene Pierini, LPN   Activities of Daily Living    07/18/2022    9:58 AM  In your present state of health, do you have any difficulty performing the following activities:  Hearing? 0  Vision? 0  Difficulty concentrating or making decisions? 0  Walking or climbing stairs? 0  Dressing or bathing? 0  Doing errands, shopping? 0  Preparing Food and eating ? N  Using the Toilet? N  In the past six months, have you accidently leaked urine? N  Do you have problems with loss of bowel control? N  Managing your Medications? N  Managing your Finances? N  Housekeeping or managing your Housekeeping? N    Patient Care Team: Ivy Lynn, NP as PCP - General (Nurse  Practitioner)  Indicate any recent Medical Services you may have received from other than [facility] providers in the past year (date may be approximate).     Assessment:   This is a routine wellness examination for Jerald.  Hearing/Vision screen Vision Screening - Comments:: Wears rx glasses - up to date with routine eye exams with [facility]    Dietary issues and exercise activities discussed: Current Exercise Habits: Home exercise routine, Type of exercise: walking, Time (Minutes): 30, Frequency (Times/Week): 3, Weekly Exercise (Minutes/Week): 90, Exercise limited by: None identified   Goals Addressed             This Visit's Progress    Patient Stated   On track    07/13/2020 AWV Goal: Exercise for General Health  Patient will verbalize understanding of the benefits of increased physical activity: Exercising regularly is important. It will improve your overall fitness, flexibility, and endurance. Regular exercise also will improve your overall health. It can help you control your weight, reduce stress, and improve your bone density. Over the next year, patient will increase physical activity as tolerated with a goal of at least 150 minutes of moderate physical activity per week.  You can tell that you are exercising at a moderate intensity if your heart starts beating faster and you start breathing faster but can still hold a conversation. Moderate-intensity exercise ideas include: Walking 1 mile (1.6 km) in about 15 minutes Biking Hiking Golfing Dancing Water aerobics Patient will verbalize understanding of everyday activities that increase physical activity by providing examples like the following: Yard work, such as: Sales promotion account executive Gardening Washing windows or floors Patient will be able to explain general safety guidelines for exercising:  Before you start a new exercise program,  talk with your health care provider. Do not exercise so much that you hurt yourself, feel dizzy, or get very short of breath. Wear comfortable clothes and wear shoes with good support. Drink plenty of water while you exercise to prevent dehydration or heat stroke. Work out until your breathing  and your heartbeat get faster.        Depression Screen    07/18/2022    9:55 AM 05/05/2022    9:12 AM 11/03/2021    9:16 AM 07/15/2021    9:13 AM 05/05/2021    2:45 PM 09/22/2020    9:13 AM 07/13/2020    9:59 AM  PHQ 2/9 Scores  PHQ - 2 Score 0 2 0 1 0 4 1  PHQ- 9 Score  4 0 _0 Fall Risk    07/18/2022    9:52 AM 05/05/2022    9:14 AM 11/03/2021    9:16 AM 07/15/2021   10:27 AM 07/15/2021    4:59 AM  Fall Risk   Falls in the past year? 0 0 0 1 1  Number falls in past yr: 0   1 1  Injury with Fall? 0   1 1  Risk for fall due to : No Fall Risks   History of fall(s);Other (Comment)   Risk for fall due to: Comment    she has blacked out twice in the last few months - refused ER and PCP had no openings - she declines appt right now - will call if this occurs again   Follow up Falls prevention discussed   Education provided;Falls prevention discussed     FALL RISK PREVENTION PERTAINING TO THE HOME:  Any stairs in or around the home? Yes  If so, are there any without handrails? No  Home free of loose throw rugs in walkways, pet beds, electrical cords, etc? Yes  Adequate lighting in your home to reduce risk of falls? Yes   ASSISTIVE DEVICES UTILIZED TO PREVENT FALLS:  Life alert? No  Use of a cane, walker or w/c? No  Grab bars in the bathroom? No  Shower chair or bench in shower? No  Elevated toilet seat or a handicapped toilet? No          07/18/2022    9:59 AM 07/15/2021   10:29 AM 07/13/2020    9:52 AM  6CIT Screen  What Year? 0 points 0 points 0 points  What month? 0 points 0 points 0 points  What time? 0 points 0 points 0 points  Count back from 20 0 points 0  points 0 points  Months in reverse 0 points 0 points 0 points  Repeat phrase 0 points 0 points 0 points  Total Score 0 points 0 points 0 points    Immunizations Immunization History  Administered Date(s) Administered   COVID-19, mRNA, vaccine(Comirnaty)12 years and older 05/25/2022   Fluad Quad(high Dose 65+) 06/10/2020, 05/18/2021, 04/24/2022   Moderna Covid-19 Vaccine Bivalent Booster 75yr & up 08/09/2021   Moderna Sars-Covid-2 Vaccination 11/04/2019, 12/02/2019, 06/15/2020, 02/07/2022   PNEUMOCOCCAL CONJUGATE-20 08/31/2021   Pneumococcal Conjugate-13 04/13/2020   Respiratory Syncytial Virus Vaccine,Recomb Aduvanted(Arexvy) 05/10/2022   Zoster Recombinat (Shingrix) 04/13/2020, 08/31/2021, 11/01/2021    TDAP status: Due, Education has been provided regarding the importance of this vaccine. Advised may receive this vaccine at local pharmacy or Health Dept. Aware to provide a copy of the vaccination record if obtained from local pharmacy or Health Dept. Verbalized acceptance and understanding.  Flu Vaccine status: Up to date  Pneumococcal vaccine status: Up to date  Covid-19 vaccine status: Information provided on how to obtain vaccines.   Qualifies for Shingles Vaccine? Yes   Zostavax completed Yes   Shingrix Completed?: Yes  Screening Tests Health Maintenance  Topic Date Due  DTaP/Tdap/Td (1 - Tdap) Never done   DEXA SCAN  11/04/2022 (Originally Oct 15, 1950)   Hepatitis C Screening  11/04/2022 (Originally 08/23/1968)   OPHTHALMOLOGY EXAM  08/01/2022   MAMMOGRAM  08/23/2022   Diabetic kidney evaluation - Urine ACR  11/04/2022   FOOT EXAM  11/04/2022   HEMOGLOBIN A1C  11/04/2022   Diabetic kidney evaluation - eGFR measurement  05/06/2023   Medicare Annual Wellness (AWV)  07/19/2023   Fecal DNA (Cologuard)  10/20/2023   Pneumonia Vaccine 60+ Years old  Completed   INFLUENZA VACCINE  Completed   COVID-19 Vaccine  Completed   Zoster Vaccines- Shingrix  Completed   HPV  VACCINES  Aged Out    Health Maintenance  Health Maintenance Due  Topic Date Due   DTaP/Tdap/Td (1 - Tdap) Never done    Colorectal cancer screening: Type of screening: Cologuard. Completed 10/19/2020. Repeat every 3 years  Mammogram status: Completed 08/23/2021. Repeat every year  Bone Density status: Ordered 07/18/2022. Pt provided with contact info and advised to call to schedule appt.  Lung Cancer Screening: (Low Dose CT Chest recommended if Age 73-80 years, 30 pack-year currently smoking OR have quit w/in 15years.) does not qualify.   Lung Cancer Screening Referral: n/a  Additional Screening:  Hepatitis C Screening: does not qualify;   Vision Screening: Recommended annual ophthalmology exams for early detection of glaucoma and other disorders of the eye. Is the patient up to date with their annual eye exam?  Yes  Who is the provider or what is the name of the office in which the patient attends annual eye exams? [facility]  If pt is not established with a provider, would they like to be referred to a provider to establish care? No .   Dental Screening: Recommended annual dental exams for proper oral hygiene  Community Resource Referral / Chronic Care Management: CRR required this visit?  No   CCM required this visit?  No      Plan:     I have personally reviewed and noted the following in the patient's chart:   Medical and social history Use of alcohol, tobacco or illicit drugs  Current medications and supplements including opioid prescriptions. Patient is not currently taking opioid prescriptions. Functional ability and status Nutritional status Physical activity Advanced directives List of other physicians Hospitalizations, surgeries, and ER visits in previous 12 months Vitals Screenings to include cognitive, depression, and falls Referrals and appointments  In addition, I have reviewed and discussed with patient certain preventive protocols, quality  metrics, and best practice recommendations. A written personalized care plan for preventive services as well as general preventive health recommendations were provided to patient.     Daphane Shepherd, LPN   00/17/[id number]   Nurse Notes: Due Tdap [place] Area exam

## 2022-08-04 ENCOUNTER — Ambulatory Visit: Payer: No Typology Code available for payment source | Admitting: Nurse Practitioner

## 2022-08-04 ENCOUNTER — Other Ambulatory Visit: Payer: No Typology Code available for payment source

## 2022-08-09 ENCOUNTER — Other Ambulatory Visit: Payer: Self-pay | Admitting: Nurse Practitioner

## 2022-08-09 ENCOUNTER — Other Ambulatory Visit: Payer: Self-pay | Admitting: Family Medicine

## 2022-08-09 DIAGNOSIS — E1165 Type 2 diabetes mellitus with hyperglycemia: Secondary | ICD-10-CM

## 2022-08-09 DIAGNOSIS — E782 Mixed hyperlipidemia: Secondary | ICD-10-CM

## 2022-08-10 ENCOUNTER — Other Ambulatory Visit: Payer: Self-pay | Admitting: Family Medicine

## 2022-08-10 DIAGNOSIS — I5042 Chronic combined systolic (congestive) and diastolic (congestive) heart failure: Secondary | ICD-10-CM

## 2022-08-15 ENCOUNTER — Other Ambulatory Visit: Payer: Self-pay | Admitting: Nurse Practitioner

## 2022-08-15 DIAGNOSIS — Z1231 Encounter for screening mammogram for malignant neoplasm of breast: Secondary | ICD-10-CM

## 2022-08-17 ENCOUNTER — Encounter (HOSPITAL_COMMUNITY): Payer: No Typology Code available for payment source | Admitting: Cardiology

## 2022-08-24 ENCOUNTER — Other Ambulatory Visit (HOSPITAL_COMMUNITY): Payer: Self-pay

## 2022-08-24 ENCOUNTER — Telehealth (HOSPITAL_COMMUNITY): Payer: Self-pay

## 2022-08-24 ENCOUNTER — Other Ambulatory Visit: Payer: Self-pay | Admitting: Family Medicine

## 2022-08-24 ENCOUNTER — Ambulatory Visit (INDEPENDENT_AMBULATORY_CARE_PROVIDER_SITE_OTHER): Payer: No Typology Code available for payment source

## 2022-08-24 DIAGNOSIS — Z780 Asymptomatic menopausal state: Secondary | ICD-10-CM

## 2022-08-24 DIAGNOSIS — Z0000 Encounter for general adult medical examination without abnormal findings: Secondary | ICD-10-CM

## 2022-08-24 NOTE — Telephone Encounter (Signed)
Advanced Heart Failure Patient Advocate Encounter  Prior authorization is required for [place]. PA submitted and APPROVED on 08/24/22.  Key [id number] Effective: 08/24/22 - 08/24/23  Clista Bernhardt, CPhT Rx Patient Advocate Phone: [phone]

## 2022-08-25 DIAGNOSIS — M85852 Other specified disorders of bone density and structure, left thigh: Secondary | ICD-10-CM | POA: Diagnosis not present

## 2022-08-25 DIAGNOSIS — Z780 Asymptomatic menopausal state: Secondary | ICD-10-CM | POA: Diagnosis not present

## 2022-09-04 ENCOUNTER — Telehealth: Payer: Self-pay | Admitting: Family Medicine

## 2022-09-04 ENCOUNTER — Other Ambulatory Visit: Payer: Self-pay | Admitting: Family Medicine

## 2022-09-04 DIAGNOSIS — I5042 Chronic combined systolic (congestive) and diastolic (congestive) heart failure: Secondary | ICD-10-CM

## 2022-09-04 DIAGNOSIS — E1165 Type 2 diabetes mellitus with hyperglycemia: Secondary | ICD-10-CM

## 2022-09-04 MED ORDER — METFORMIN HCL 500 MG PO TABS: ORAL_TABLET | ORAL | 0 refills | Status: DC

## 2022-09-04 NOTE — Telephone Encounter (Signed)
Pt has appt on 09/27/22 refill sent to pharmacy

## 2022-09-04 NOTE — Telephone Encounter (Signed)
  Prescription Request  09/04/2022  Is this a "Controlled Substance" medicine? metFORMIN (GLUCOPHAGE) 500 MG tablet   Have you seen your PCP in the last 2 weeks? no  If YES, route message to pool  -  If NO, patient needs to be scheduled for appointment.  What is the name of the medication or equipment? metFORMIN (GLUCOPHAGE) 500 MG tablet   Have you contacted your pharmacy to request a refill? Pharmacy called   Which pharmacy would you like this sent to? [facility]   Patient notified that their request is being sent to the clinical staff for review and that they should receive a response within 2 business days.

## 2022-09-05 ENCOUNTER — Ambulatory Visit (HOSPITAL_COMMUNITY)
Admission: RE | Admit: 2022-09-05 | Discharge: 2022-09-05 | Disposition: A | Discharge status: Home / Self Care | Payer: No Typology Code available for payment source | Source: Ambulatory Visit | Attending: Cardiology | Admitting: Cardiology

## 2022-09-05 ENCOUNTER — Encounter (HOSPITAL_COMMUNITY): Payer: Self-pay | Admitting: Cardiology

## 2022-09-05 VITALS — BP 110/70 | HR 83 | Wt 173.8 lb

## 2022-09-05 DIAGNOSIS — I255 Ischemic cardiomyopathy: Secondary | ICD-10-CM | POA: Diagnosis not present

## 2022-09-05 DIAGNOSIS — E785 Hyperlipidemia, unspecified: Secondary | ICD-10-CM | POA: Diagnosis not present

## 2022-09-05 DIAGNOSIS — I5042 Chronic combined systolic (congestive) and diastolic (congestive) heart failure: Secondary | ICD-10-CM | POA: Diagnosis not present

## 2022-09-05 DIAGNOSIS — Z79899 Other long term (current) drug therapy: Secondary | ICD-10-CM | POA: Insufficient documentation

## 2022-09-05 DIAGNOSIS — Z955 Presence of coronary angioplasty implant and graft: Secondary | ICD-10-CM | POA: Insufficient documentation

## 2022-09-05 DIAGNOSIS — Z7902 Long term (current) use of antithrombotics/antiplatelets: Secondary | ICD-10-CM | POA: Diagnosis not present

## 2022-09-05 DIAGNOSIS — I214 Non-ST elevation (NSTEMI) myocardial infarction: Secondary | ICD-10-CM | POA: Diagnosis not present

## 2022-09-05 DIAGNOSIS — M79606 Pain in leg, unspecified: Secondary | ICD-10-CM | POA: Diagnosis not present

## 2022-09-05 DIAGNOSIS — I252 Old myocardial infarction: Secondary | ICD-10-CM | POA: Diagnosis not present

## 2022-09-05 DIAGNOSIS — Z7984 Long term (current) use of oral hypoglycemic drugs: Secondary | ICD-10-CM | POA: Insufficient documentation

## 2022-09-05 DIAGNOSIS — Z79890 Hormone replacement therapy (postmenopausal): Secondary | ICD-10-CM | POA: Insufficient documentation

## 2022-09-05 DIAGNOSIS — I2510 Atherosclerotic heart disease of native coronary artery without angina pectoris: Secondary | ICD-10-CM | POA: Insufficient documentation

## 2022-09-05 DIAGNOSIS — E119 Type 2 diabetes mellitus without complications: Secondary | ICD-10-CM | POA: Insufficient documentation

## 2022-09-05 LAB — BASIC METABOLIC PANEL
Anion gap: 14 (ref 5–15)
BUN: 22 mg/dL (ref 8–23)
CO2: 23 mmol/L (ref 22–32)
Calcium: 9.7 mg/dL (ref 8.9–10.3)
Chloride: 102 mmol/L (ref 98–111)
Creatinine, Ser: 1.05 mg/dL — ABNORMAL HIGH (ref 0.44–1.00)
GFR, Estimated: 57 mL/min — ABNORMAL LOW (ref >60)
Glucose, Bld: 213 mg/dL — ABNORMAL HIGH (ref 70–99)
Potassium: 4.2 mmol/L (ref 3.5–5.1)
Sodium: 139 mmol/L (ref 135–145)

## 2022-09-05 LAB — BRAIN NATRIURETIC PEPTIDE: B Natriuretic Peptide: 64.1 pg/mL (ref 0.0–100.0)

## 2022-09-05 NOTE — Patient Instructions (Signed)
There has been no changes to your medications.  Labs done today, your results will be available in MyChart, we will contact you for abnormal readings.  How to Prepare for Your Cardiac PET/CT Stress Test:  1. Please do not take these medications before your test:   Medications that may interfere with the cardiac pharmacological stress agent (ex. nitrates - including erectile dysfunction medications, isosorbide mononitrate, tamulosin or beta-blockers) the day of the exam. (Erectile dysfunction medication should be held for at least 72 hrs prior to test) Theophylline containing medications for 12 hours. Dipyridamole 48 hours prior to the test. Your remaining medications may be taken with water.  2. Nothing to eat or drink, except water, 3 hours prior to arrival time.   NO caffeine/decaffeinated products, or chocolate 12 hours prior to arrival.  3. NO perfume, cologne or lotion  4. Total time is 1 to 2 hours; you may want to bring reading material for the waiting time.  5. Please report to Radiology at the [facility] Main Entrance 30 minutes early for your test.  [place], [place] [address]  Diabetic Preparation:  Hold oral medications. You may take NPH and Lantus insulin. Do not take Humalog or Humulin R (Regular Insulin) the day of your test. Check blood sugars prior to leaving the house. If able to eat breakfast prior to 3 hour fasting, you may take all medications, including your insulin, Do not worry if you miss your breakfast dose of insulin - start at your next meal.  IF YOU THINK YOU MAY BE PREGNANT, OR ARE NURSING PLEASE INFORM THE TECHNOLOGIST.  In preparation for your appointment, medication and supplies will be purchased.  Appointment availability is limited, so if you need to cancel or reschedule, please call the Radiology Department at [phone]  24 hours in advance to avoid a cancellation fee of $100.00  What to Expect After you  Arrive:  Once you arrive and check in for your appointment, you will be taken to a preparation room within the Radiology Department.  A technologist or Nurse will obtain your medical history, verify that you are correctly prepped for the exam, and explain the procedure.  Afterwards,  an IV will be started in your arm and electrodes will be placed on your skin for EKG monitoring during the stress portion of the exam. Then you will be escorted to the PET/CT scanner.  There, staff will get you positioned on the scanner and obtain a blood pressure and EKG.  During the exam, you will continue to be connected to the EKG and blood pressure machines.  A small, safe amount of a radioactive tracer will be injected in your IV to obtain a series of pictures of your heart along with an injection of a stress agent.    After your Exam:  It is recommended that you eat a meal and drink a caffeinated beverage to counter act any effects of the stress agent.  Drink plenty of fluids for the remainder of the day and urinate frequently for the first couple of hours after the exam.  Your doctor will inform you of your test results within 7-10 business days.  For questions about your test or how to prepare for your test, please call: Marchia Bond, Cardiac Imaging Nurse Navigator  Gordy Clement, Cardiac Imaging Nurse Navigator Office: [phone]  ONCE YOUR INSURANCE HAS APPROVED YOUR TEST. YOU WILL BE CALLED TO HAVE THE TEST ARRANGED.  Your provider has ordered Ultra sound  of your legs. You will be called to have this test arranged.  Your physician recommends that you schedule a follow-up appointment in: 4 months   If you have any questions or concerns before your next appointment please send [place] a message through [place] or call our office at [phone].    TO LEAVE A MESSAGE FOR THE NURSE SELECT OPTION 2, PLEASE LEAVE A MESSAGE INCLUDING: YOUR NAME DATE OF BIRTH CALL BACK NUMBER REASON FOR CALL**this is  important as we prioritize the call backs  YOU WILL RECEIVE A CALL BACK THE SAME DAY AS LONG AS YOU CALL BEFORE 4:00 PM  At the [facility], you and your health needs are our priority. As part of our continuing mission to provide you with exceptional heart care, we have created designated Provider Care Teams. These Care Teams include your primary Cardiologist (physician) and Advanced Practice Providers (APPs- Physician Assistants and Nurse Practitioners) who all work together to provide you with the care you need, when you need it.   You may see any of the following providers on your designated Care Team at your next follow up: Dr Glori Bickers Dr Loralie Champagne Dr. Roxana Hires, NP Lyda Jester, [place] [facility] [place], [place] Forestine Na, NP Audry Riles, PharmD   Please be sure to bring in all your medications bottles to every appointment.    Thank you for choosing [facility]

## 2022-09-05 NOTE — Progress Notes (Signed)
PCP: Baruch Gouty, FNP Cardiology: Dr. Aundra Dubin  72 y.o. with history of NSTEMI and ischemic cardiomyopathy presents for followup of CHF and CAD.  Patient was admitted in 7/21 with NSTEMI.  Cath showed LAD culprit, patient had DES x 2 to proximal LAD.  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 Echo was done showing EF 25-30%, septal akinesis. Patient was started on cardiac meds and was discharged home with a Lifevest. A new diagnosis of diabetes was made and she was started on metformin.   Echo in 10/21 showed EF up to 55-60% with normal RV. Echo in 2/23 showed EF 55-60%, apical hypokinesis, normal RV, mild AI, IVC normal.   She returns for followup of CHF and CAD.  She has been having episodes of left-sided chest pain for the last 3 wks.  Episodes will last for a couple of minutes at a time then will resolve.  Not exertional.  She gets short of breath walking up stairs but this does not cause chest pain.  No NTG use.  She has been under a lot of emotional stress at home.  She also reports pain in her bilateral lower legs, often at night while in bed. Weight up 3 lbs.   Labs (7/21): K 4.1, creatinine 0.87 => 0.98 Labs (9/21): BNP 59, K 4.1, creatinine 1.2, LDL 37, TGs 197, HDL 27 Labs (10/22): K 5.3, creatinine 1.03, LDL 21, TGs 148 Labs (4/23): K 4.4, creatinine 1.1, LDL 33, TGs 177 Labs (10/23): LDL 35, TGs 140, K 4.8, creatinine 0.87  ECG (personally reviewed): NSR, normal  PMH: 1. Type 2 diabetes: New diagnosis in 7/21.  2. Hyperlipidemia 3. Chronic systolic CHF: Ischemic cardiomyopathy.   - Echo (7/21): EF 25-30%, septal akinesis.   - Echo (10/21): EF 55-60%, normal RV, mild MR.  - Echo (2/23): EF 55-60%, apical hypokinesis, normal RV, mild AI, IVC normal.  4. CAD: NSTEMI in 7/21.  - LHC: DES x 2 to proximal LAD, CTO RCA with collaterals, moderate PLOM stenosis.  5. Syncope: x 2 in 10/22.  - Zio monitor (1/23): No worrisome arrhythmias.    Social History   Socioeconomic History   Marital status: Married    Spouse name: Not on file   Number of children: 1   Years of education: Not on file   Highest education level: Not on file  Occupational History   Occupation: retired  Tobacco Use   Smoking status: Never   Smokeless tobacco: Never  Substance and Sexual Activity   Alcohol use: No   Drug use: No   Sexual activity: Not on file  Other Topics Concern   Not on file  Social History Narrative   Son living with them right now   Social Determinants of Health   Financial Resource Strain: Low Risk  (07/18/2022)   Overall Financial Resource Strain (CARDIA)    Difficulty of Paying Living Expenses: Not hard at all  Food Insecurity: No Food Insecurity (07/18/2022)   Hunger Vital Sign    Worried About Running Out of Food in the Last Year: Never true    [place] in the Last Year: Never true  Transportation Needs: No Transportation Needs (07/18/2022)   PRAPARE - Hydrologist (Medical): No    Lack of Transportation (Non-Medical): No  Physical Activity: Insufficiently Active (07/18/2022)   Exercise Vital Sign    Days of Exercise per Week: 3 days    Minutes  of Exercise per Session: 30 min  Stress: No Stress Concern Present (07/18/2022)   [place]    Feeling of Stress : Not at all  Social Connections: [place] (07/18/2022)   Social Connection and Isolation Panel [NHANES]    Frequency of Communication with Friends and Family: More than three times a week    Frequency of Social Gatherings with Friends and Family: More than three times a week    Attends Religious Services: More than 4 times per year    Active Member of [employer] or Organizations: Yes    Attends Archivist Meetings: 1 to 4 times per year    Marital Status: Married  Human resources officer Violence: Not At Risk (07/18/2022)   Humiliation, Afraid,  Rape, and Kick questionnaire    Fear of Current or Ex-Partner: No    Emotionally Abused: No    Physically Abused: No    Sexually Abused: No   Family History  Problem Relation Age of Onset   Alzheimer's disease Mother    Cancer Father    Diabetes Son    High blood pressure Son    ROS: All systems reviewed and negative except as noted in HPI.   Current Outpatient Medications  Medication Sig Dispense Refill   atorvastatin (LIPITOR) 40 MG tablet TAKE ONE TABLET DAILY 90 tablet 0   carvedilol (COREG) 12.5 MG tablet TAKE 1 TABLET 2 TIMES A DAY WITH A MEAL 180 tablet 3   cetirizine (ZYRTEC) 10 MG tablet Take 10 mg by mouth as needed for allergies.     Cholecalciferol (VITAMIN D3) 50 MCG (2000 UT) capsule Take 1 capsule (2,000 Units total) by mouth daily. 90 capsule 0   clopidogrel (PLAVIX) 75 MG tablet TAKE ONE TABLET ONCE DAILY 30 tablet 6   Cyanocobalamin (VITAMIN B 12 PO) Take 1 tablet by mouth daily.     dapagliflozin propanediol (FARXIGA) 10 MG TABS tablet Take 1 tablet (10 mg total) by mouth daily before breakfast. 90 tablet 3   furosemide (LASIX) 20 MG tablet Take 1 tablet (20 mg total) by mouth daily as needed. (NEEDS TO BE SEEN BEFORE NEXT REFILL) 30 tablet 5   Lancets (ONETOUCH DELICA PLUS [id number]) MISC Apply 1 each topically daily. 1 each 2   levothyroxine (SYNTHROID) 25 MCG tablet Take 1 tablet (25 mcg total) by mouth daily before breakfast. 90 tablet 1   metFORMIN (GLUCOPHAGE) 500 MG tablet TAKE ONE TABLET TWICE DAILY WITH FOOD 60 tablet 0   nitroGLYCERIN (NITROSTAT) 0.4 MG SL tablet Place 1 tablet (0.4 mg total) under the tongue every 5 (five) minutes x 3 doses as needed for chest pain. 25 tablet 2   ONETOUCH VERIO test strip CHECK BLOOD SUGAR ONCE DAILY OR AS DIRECTED 50 strip 0   sacubitril-valsartan (ENTRESTO) 97-103 MG Take 1 tablet by mouth 2 (two) times daily. 180 tablet 3   sodium chloride (OCEAN) 0.65 % SOLN nasal spray Place 1 spray into both nostrils as needed for  congestion. 30 mL 3   spironolactone (ALDACTONE) 25 MG tablet Take 1 tablet (25 mg total) by mouth daily. (NEEDS TO BE SEEN BEFORE NEXT REFILL) 30 tablet 0   No current facility-administered medications for this encounter.   BP 110/70   Pulse 83   Wt 78.8 kg (173 lb 12.8 oz)   SpO2 97%   BMI 30.79 kg/m  General: NAD Neck: No JVD, no thyromegaly or thyroid nodule.  Lungs: Clear to auscultation  bilaterally with normal respiratory effort. CV: Nondisplaced PMI.  Heart regular S1/S2, no S3/S4, no murmur.  No peripheral edema.  No carotid bruit.  Unable to palpate pedal pulses.  Abdomen: Soft, nontender, no hepatosplenomegaly, no distention.  Skin: Intact without lesions or rashes.  Neurologic: Alert and oriented x 3.  Psych: Normal affect. Extremities: No clubbing or cyanosis.  HEENT: Normal.   Assessment/Plan: 1. CAD: NSTEMI in 7/21 with DES x 2 to proximal LAD.  There was a chronic total occlusion of the RCA and moderate stenosis in the PLOM.  She has been having episodes of atypical chest pain x 3 wks.  - Continue Plavix 75 mg daily.    - Atorvastatin 80 mg daily, good LDL in 10/23.  - I will arrange for cardiac PET to assess for ischemia given chest pain.  2. Chronic systolic CHF: Ischemic cardiomyopathy.  Echo in 7/21 with EF 25-30%, akinetic septum.  Echo in 10/21 with EF up to 55-60%.  Echo in 2/23 showed EF 55-60%, apical hypokinesis, normal RV, mild AI, IVC normal.  She is not volume overloaded on exam, NYHA class II.  - Continue Entresto 97/103 bid.  - Continue Coreg 12.5 mg bid.  - Continue spironolactone 25 mg daily. BMET/BNP today.  - Continue Farxiga 10 mg daily.  3. Leg pain: Not clearly claudication but it is difficult to palpate pedal pulses.  - I will arrange for ABIs.   Followup 4 months with APP.    Loralie Champagne 09/05/2022

## 2022-09-08 ENCOUNTER — Other Ambulatory Visit (HOSPITAL_COMMUNITY): Payer: Self-pay | Admitting: Cardiology

## 2022-09-08 ENCOUNTER — Telehealth (HOSPITAL_COMMUNITY): Payer: Self-pay | Admitting: *Deleted

## 2022-09-08 DIAGNOSIS — I739 Peripheral vascular disease, unspecified: Secondary | ICD-10-CM

## 2022-09-08 DIAGNOSIS — I5042 Chronic combined systolic (congestive) and diastolic (congestive) heart failure: Secondary | ICD-10-CM

## 2022-09-08 NOTE — Telephone Encounter (Signed)
   PET SCAN [facility]

## 2022-09-11 ENCOUNTER — Encounter (HOSPITAL_COMMUNITY): Payer: Self-pay

## 2022-09-19 ENCOUNTER — Ambulatory Visit (HOSPITAL_COMMUNITY)
Admission: RE | Admit: 2022-09-19 | Discharge: 2022-09-19 | Disposition: A | Discharge status: Home / Self Care | Payer: No Typology Code available for payment source | Source: Ambulatory Visit | Attending: Cardiology | Admitting: Cardiology

## 2022-09-19 DIAGNOSIS — I739 Peripheral vascular disease, unspecified: Secondary | ICD-10-CM | POA: Diagnosis not present

## 2022-09-19 DIAGNOSIS — I5042 Chronic combined systolic (congestive) and diastolic (congestive) heart failure: Secondary | ICD-10-CM | POA: Diagnosis not present

## 2022-09-19 LAB — VAS US ABI WITH/WO TBI
Left ABI: 1.12
Right ABI: 1.2

## 2022-09-20 ENCOUNTER — Telehealth (HOSPITAL_COMMUNITY): Payer: Self-pay | Admitting: *Deleted

## 2022-09-27 ENCOUNTER — Encounter: Payer: Self-pay | Admitting: Family Medicine

## 2022-09-27 ENCOUNTER — Ambulatory Visit: Payer: No Typology Code available for payment source | Admitting: Family Medicine

## 2022-09-27 VITALS — BP 129/85 | HR 83 | Temp 97.0°F | Ht 63.0 in | Wt 171.4 lb

## 2022-09-27 DIAGNOSIS — H6123 Impacted cerumen, bilateral: Secondary | ICD-10-CM

## 2022-09-27 DIAGNOSIS — G2581 Restless legs syndrome: Secondary | ICD-10-CM | POA: Diagnosis not present

## 2022-09-27 DIAGNOSIS — E1169 Type 2 diabetes mellitus with other specified complication: Secondary | ICD-10-CM | POA: Diagnosis not present

## 2022-09-27 DIAGNOSIS — E1165 Type 2 diabetes mellitus with hyperglycemia: Secondary | ICD-10-CM | POA: Diagnosis not present

## 2022-09-27 DIAGNOSIS — N183 Chronic kidney disease, stage 3 unspecified: Secondary | ICD-10-CM | POA: Diagnosis not present

## 2022-09-27 DIAGNOSIS — E785 Hyperlipidemia, unspecified: Secondary | ICD-10-CM | POA: Diagnosis not present

## 2022-09-27 DIAGNOSIS — E538 Deficiency of other specified B group vitamins: Secondary | ICD-10-CM | POA: Diagnosis not present

## 2022-09-27 DIAGNOSIS — E559 Vitamin D deficiency, unspecified: Secondary | ICD-10-CM | POA: Insufficient documentation

## 2022-09-27 DIAGNOSIS — I5042 Chronic combined systolic (congestive) and diastolic (congestive) heart failure: Secondary | ICD-10-CM | POA: Diagnosis not present

## 2022-09-27 DIAGNOSIS — E039 Hypothyroidism, unspecified: Secondary | ICD-10-CM | POA: Insufficient documentation

## 2022-09-27 DIAGNOSIS — E1122 Type 2 diabetes mellitus with diabetic chronic kidney disease: Secondary | ICD-10-CM

## 2022-09-27 DIAGNOSIS — E782 Mixed hyperlipidemia: Secondary | ICD-10-CM | POA: Diagnosis not present

## 2022-09-27 LAB — BAYER DCA HB A1C WAIVED: HB A1C (BAYER DCA - WAIVED): 7.5 % — ABNORMAL HIGH (ref 4.8–5.6)

## 2022-09-27 MED ORDER — SEMAGLUTIDE(0.25 OR 0.5MG/DOS) 2 MG/3ML ~~LOC~~ SOPN: 0.2500 mg | PEN_INJECTOR | SUBCUTANEOUS | 3 refills | Status: DC

## 2022-09-27 MED ORDER — SPIRONOLACTONE 25 MG PO TABS: 25.0000 mg | ORAL_TABLET | Freq: Every day | ORAL | 1 refills | Status: DC

## 2022-09-27 MED ORDER — METFORMIN HCL 500 MG PO TABS: ORAL_TABLET | ORAL | 1 refills | Status: DC

## 2022-09-27 NOTE — Progress Notes (Signed)
Subjective:  Patient ID: Becky Boyle, female    DOB: August 18, 1950, 72 y.o.   MRN: [id number]  Patient Care Team: Baruch Gouty, FNP as PCP - General (Family Medicine)   Chief Complaint:  Establish Care (Je patient ), Medical Management of Chronic Issues, and Leg Pain (Bilateral leg pain at night that has been ongoing)   HPI: JEANNET Boyle is a 72 y.o. female presenting on 09/27/2022 for [place] (Je patient ), Medical Management of Chronic Issues, and Leg Pain (Bilateral leg pain at night that has been ongoing)   1. Type 2 diabetes mellitus with hyperglycemia, without long-term current use of insulin (Becky Boyle) Currently on metformin and farxiga, tolerating well. Denies polyuria, polyphagia, or polydipsia. No visual changes. Aware she needs to have her eye exam completed.   2. Acquired hypothyroidism Compliant with medications - Yes Current medications - Synthroid 25 mcg Adverse side effects - No Weight - stable  Bowel habit changes - No Heat or cold intolerance - No Mood changes - No Changes in sleep habits - No Fatigue - No Skin, hair, or nail changes - No Tremor - No Palpitations - No Edema - No Shortness of breath - No  3. Hyperlipidemia due to type 2 diabetes mellitus (Becky Boyle) On statin therapy and tolerating well. Does not follow a specific diet or exercise routine. No significant myalgias but does have worsening restless leg symptoms.   4. Vitamin D deficiency Pt is taking oral repletion therapy. Denies bone pain and tenderness, muscle weakness, fracture, and difficulty walking. Lab Results  Component Value Date   VD25OH 21.9 (L) 05/05/2022   Lab Results  Component Value Date   CALCIUM 9.7 09/05/2022     5. Chronic combined systolic and diastolic heart failure (Becky Boyle) Followed by cardiology on a regular basis. No new or worsening symptoms. Compliant with medications.   6. Vitamin B12 deficiency On oral repletion therapy, does report worsening restless legs  over the last several weeks.   7. CKD stage 3 due to type 2 diabetes mellitus (Becky Boyle) Currently on Farxiga and tolerating well. No changes in urine output. No weakness, confusion, or fatigue.      Relevant past medical, surgical, family, and social history reviewed and updated as indicated.  Allergies and medications reviewed and updated. Data reviewed: Chart in Epic.   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[place])    CKD (chronic kidney disease) stage 3, GFR 30-59 ml/min (HCC)    Depression    Diabetes ([place])    Generalized anxiety disorder with panic attacks 09/24/2020   Heart failure ([place])    History of non-ST elevation myocardial infarction (NSTEMI)    Vitamin B12 deficiency 11/04/2021    Past Surgical History:  Procedure Laterality Date   ABDOMINAL HYSTERECTOMY     BRAIN SURGERY  1989   tumor    CORONARY STENT INTERVENTION N/A 02/17/2020   Procedure: CORONARY STENT INTERVENTION;  Surgeon: Jettie Booze, MD;  Location: [facility];  Service: Cardiovascular;  Laterality: N/A;   RIGHT/LEFT HEART CATH AND CORONARY ANGIOGRAPHY N/A 02/17/2020   Procedure: RIGHT/LEFT HEART CATH AND CORONARY ANGIOGRAPHY;  Surgeon: Jettie Booze, MD;  Location: [facility];  Service: Cardiovascular;  Laterality: N/A;    Social History   Socioeconomic History   Marital status: Married    Spouse name: Not on file   Number of children: 1  Years of education: Not on file   Highest education level: Not on file  Occupational History   Occupation: retired  Tobacco Use   Smoking status: Never   Smokeless tobacco: Never  Vaping Use   Vaping Use: Never used  Substance and Sexual Activity   Alcohol use: No   Drug use: No   Sexual activity: Not on file  Other Topics Concern   Not on file  Social History Narrative   Son living with them right now   Social Determinants of Health   Financial Resource Strain: Low  Risk  (07/18/2022)   Overall Financial Resource Strain (CARDIA)    Difficulty of Paying Living Expenses: Not hard at all  Food Insecurity: No Food Insecurity (07/18/2022)   Hunger Vital Sign    Worried About Running Out of Food in the Last Year: Never true    [place] in the Last Year: Never true  Transportation Needs: No Transportation Needs (07/18/2022)   PRAPARE - Hydrologist (Medical): No    Lack of Transportation (Non-Medical): No  Physical Activity: Insufficiently Active (07/18/2022)   Exercise Vital Sign    Days of Exercise per Week: 3 days    Minutes of Exercise per Session: 30 min  Stress: No Stress Concern Present (07/18/2022)   [place]    Feeling of Stress : Not at all  Social Connections: [place] (07/18/2022)   Social Connection and Isolation Panel [NHANES]    Frequency of Communication with Friends and Family: More than three times a week    Frequency of Social Gatherings with Friends and Family: More than three times a week    Attends Religious Services: More than 4 times per year    Active Member of [employer] or Organizations: Yes    Attends Archivist Meetings: 1 to 4 times per year    Marital Status: Married  Human resources officer Violence: Not At Risk (07/18/2022)   Humiliation, Afraid, Rape, and Kick questionnaire    Fear of Current or Ex-Partner: No    Emotionally Abused: No    Physically Abused: No    Sexually Abused: No    Outpatient Encounter Medications as of 09/27/2022  Medication Sig   atorvastatin (LIPITOR) 40 MG tablet TAKE ONE TABLET DAILY   carvedilol (COREG) 12.5 MG tablet TAKE 1 TABLET 2 TIMES A DAY WITH A MEAL   cetirizine (ZYRTEC) 10 MG tablet Take 10 mg by mouth as needed for allergies.   Cholecalciferol (VITAMIN D3) 50 MCG (2000 UT) capsule Take 1 capsule (2,000 Units total) by mouth daily. (Patient taking differently: Take 500  Units by mouth daily.)   clopidogrel (PLAVIX) 75 MG tablet TAKE ONE TABLET ONCE DAILY   Cyanocobalamin (VITAMIN B 12 PO) Take 1 tablet by mouth daily.   dapagliflozin propanediol (FARXIGA) 10 MG TABS tablet Take 1 tablet (10 mg total) by mouth daily before breakfast.   furosemide (LASIX) 20 MG tablet Take 1 tablet (20 mg total) by mouth daily as needed. (NEEDS TO BE SEEN BEFORE NEXT REFILL)   Lancets (ONETOUCH DELICA PLUS [id number]) MISC Apply 1 each topically daily.   levothyroxine (SYNTHROID) 25 MCG tablet Take 1 tablet (25 mcg total) by mouth daily before breakfast.   nitroGLYCERIN (NITROSTAT) 0.4 MG SL tablet Place 1 tablet (0.4 mg total) under the tongue every 5 (five) minutes x 3 doses as needed for chest pain.   ONETOUCH VERIO test  strip CHECK BLOOD SUGAR ONCE DAILY OR AS DIRECTED   sacubitril-valsartan (ENTRESTO) 97-103 MG Take 1 tablet by mouth 2 (two) times daily.   Semaglutide,0.25 or 0.'5MG'$ /DOS, 2 MG/3ML SOPN Inject 0.25 mg into the skin once a week.   sodium chloride (OCEAN) 0.65 % SOLN nasal spray Place 1 spray into both nostrils as needed for congestion.   [DISCONTINUED] metFORMIN (GLUCOPHAGE) 500 MG tablet TAKE ONE TABLET TWICE DAILY WITH FOOD   [DISCONTINUED] spironolactone (ALDACTONE) 25 MG tablet Take 1 tablet (25 mg total) by mouth daily. (NEEDS TO BE SEEN BEFORE NEXT REFILL)   metFORMIN (GLUCOPHAGE) 500 MG tablet TAKE ONE TABLET TWICE DAILY WITH FOOD   spironolactone (ALDACTONE) 25 MG tablet Take 1 tablet (25 mg total) by mouth daily.   No facility-administered encounter medications on file as of 09/27/2022.    Allergies  Allergen Reactions   Latex Anaphylaxis   Penicillins Hives   Codeine Rash   Tape Rash    Review of Systems  Constitutional:  Negative for activity change, appetite change, chills, diaphoresis, fatigue, fever and unexpected weight change.  HENT:  Positive for tinnitus.   Eyes: Negative.  Negative for photophobia and visual disturbance.  Respiratory:   Negative for apnea, cough, choking, chest tightness, shortness of breath, wheezing and stridor.   Cardiovascular:  Positive for leg swelling. Negative for chest pain and palpitations.  Gastrointestinal:  Negative for abdominal pain, blood in stool, constipation, diarrhea, nausea and vomiting.  Endocrine: Negative.  Negative for polydipsia, polyphagia and polyuria.  Genitourinary:  Negative for decreased urine volume, difficulty urinating, dysuria, frequency and urgency.  Musculoskeletal:  Negative for arthralgias and myalgias.  Skin: Negative.   Allergic/Immunologic: Negative.   Neurological:  Negative for dizziness, tremors, seizures, syncope, facial asymmetry, speech difficulty, weakness, light-headedness and headaches.       BLE paresthesia and restless legs  Hematological: Negative.   Psychiatric/Behavioral:  Negative for confusion, hallucinations, sleep disturbance and suicidal ideas.   All other systems reviewed and are negative.       Objective:  BP 129/85   Pulse 83   Temp (!) 97 F (36.1 C) (Temporal)   Ht '5\' 3"'$  (1.6 m)   Wt 171 lb 6.4 oz (77.7 kg)   SpO2 97%   BMI 30.36 kg/m    Wt Readings from Last 3 Encounters:  09/27/22 171 lb 6.4 oz (77.7 kg)  09/05/22 173 lb 12.8 oz (78.8 kg)  07/18/22 155 lb (70.3 kg)    Physical Exam Vitals and nursing note reviewed.  Constitutional:      General: She is not in acute distress.    Appearance: Normal appearance. She is well-developed and well-groomed. She is obese. She is not ill-appearing, toxic-appearing or diaphoretic.  HENT:     Head: Normocephalic and atraumatic.     Jaw: There is normal jaw occlusion.     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 There is impacted cerumen.     Left Ear: Hearing normal. There is impacted cerumen.     Nose: Nose normal.     Mouth/Throat:     Lips: Pink.     Mouth: Mucous membranes are moist.     Pharynx: Oropharynx is clear. Uvula midline.   Eyes:     General: Lids are normal.     Extraocular Movements: Extraocular movements intact.     Conjunctiva/sclera: Conjunctivae normal.     Pupils: Pupils are equal, round, and reactive to light.  Neck:     Thyroid: No thyroid mass, thyromegaly or thyroid tenderness.     Vascular: No carotid bruit or JVD.     Trachea: Trachea and phonation normal.  Cardiovascular:     Rate and Rhythm: Normal rate and regular rhythm.     Chest Wall: PMI is not displaced.     Pulses: Normal pulses.     Heart sounds: Normal heart sounds. No murmur heard.    No friction rub. No gallop.  Pulmonary:     Effort: Pulmonary effort is normal. No respiratory distress.     Breath sounds: Normal breath sounds. No wheezing.  Abdominal:     General: Bowel sounds are normal. There is no distension or abdominal bruit.     Palpations: Abdomen is soft. There is no hepatomegaly or splenomegaly.     Tenderness: There is no abdominal tenderness. There is no right CVA tenderness or left CVA tenderness.     Hernia: No hernia is present.  Musculoskeletal:        General: Normal range of motion.     Cervical back: Normal range of motion and neck supple.     Right lower leg: No edema.     Left lower leg: No edema.  Lymphadenopathy:     Cervical: No cervical adenopathy.  Skin:    General: Skin is warm and dry.     Capillary Refill: Capillary refill takes less than 2 seconds.     Coloration: Skin is not cyanotic, jaundiced or pale.     Findings: No rash.  Neurological:     General: No focal deficit present.     Mental Status: She is alert and oriented to person, place, and time.     Sensory: Sensation is intact.     Motor: Motor function is intact.     Coordination: Coordination is intact.     Gait: Gait is intact.     Deep Tendon Reflexes: Reflexes are normal and symmetric.  Psychiatric:        Attention and Perception: Attention and perception normal.        Mood and Affect: Mood and affect normal.         Speech: Speech normal.        Behavior: Behavior normal. Behavior is cooperative.        Thought Content: Thought content normal.        Cognition and Memory: Cognition and memory normal.        Judgment: Judgment normal.     Results for orders placed or performed during the hospital encounter of 09/19/22  VAS [place] ABI WITH/WO TBI  Result Value Ref Range   Right ABI 1.20    Left ABI 1.12        Pertinent labs & imaging results that were available during my care of the patient were reviewed by me and considered in my medical decision making.  Assessment & Plan:  Lalana was seen today for establish care, medical management of chronic issues and leg pain.  Diagnoses and all orders for this visit:  Type 2 diabetes mellitus with hyperglycemia, without long-term current use of insulin (HCC) A1C 7.5 today. Significant cardiac history and not on GLP1. Will initiate Ozempic today. Continue other medications. Diet and exercise encouraged. May be able to discontinue metformin if pt reaches better control with Ozempic.  -     Bayer DCA Hb A1c Waived -     metFORMIN (GLUCOPHAGE) 500 MG tablet; TAKE ONE TABLET TWICE  DAILY WITH FOOD -     Semaglutide,0.25 or 0.'5MG'$ /DOS, 2 MG/3ML SOPN; Inject 0.25 mg into the skin once a week.  Acquired hypothyroidism Thyroid disease has been well controlled. Labs are pending. Adjustments to regimen will be made if warranted. Make sure to take medications on an empty stomach with a full glass of water. Make sure to avoid vitamins or supplements for at least 4 hours before and 4 hours after taking medications. Repeat labs in 3 months if adjustments are made and in 6 months if stable.   -     Thyroid Panel With TSH  Hyperlipidemia due to type 2 diabetes mellitus ([place]) Diet encouraged - increase intake of fresh fruits and vegetables, increase intake of lean proteins. Bake, broil, or grill foods. Avoid fried, greasy, and fatty foods. Avoid fast foods. Increase intake of  fiber-rich whole grains. Exercise encouraged - at least 150 minutes per week and advance as tolerated.  Goal BMI < 25. Continue medications as prescribed. Follow up in 3-6 months as discussed.  -     CMP14+EGFR -     Lipid panel  Vitamin D deficiency Labs pending. Continue repletion therapy. If indicated, will change repletion dosage. Eat foods rich in Vit D including milk, orange juice, yogurt with vitamin D added, salmon or mackerel, canned tuna fish, cereals with vitamin D added, and cod liver oil. Get out in the sun but make sure to wear at least SPF 30 sunscreen.  -     VITAMIN D 25 Hydroxy (Vit-D Deficiency, Fractures)  Chronic combined systolic and diastolic heart failure ([place]) Followed by cardiology on a regular basis. Well compensated in office. Needs refill on aldactone, refill sent.  -     spironolactone (ALDACTONE) 25 MG tablet; Take 1 tablet (25 mg total) by mouth daily.  Vitamin B12 deficiency On repletion therapy but having significant paresthesias of BLE. Will check labs today.  -     Vitamin B12  CKD stage 3 due to type 2 diabetes mellitus ([place]) Labs pending. On Farxiga.   Restless legs Will check below for potential underlying causes. Further treatment pending results.  -     Folate -     Vitamin B12 -     Iron, TIBC and Ferritin Panel  Cerumen impaction, bilateral Discussed Debrox use and then return in 2 weeks for cerumen removal.    Continue all other maintenance medications.  Follow up plan: Return in about 3 months (around 12/26/2022), or if symptoms worsen or fail to improve, for DM.   Continue healthy lifestyle choices, including diet (rich in fruits, vegetables, and lean proteins, and low in salt and simple carbohydrates) and exercise (at least 30 minutes of moderate physical activity daily).  Educational handout given for DM  The above assessment and management plan was discussed with the patient. The patient verbalized understanding of and has agreed  to the management plan. Patient is aware to call the clinic if they develop any new symptoms or if symptoms persist or worsen. Patient is aware when to return to the clinic for a follow-up visit. Patient educated on when it is appropriate to go to the emergency department.   Monia Pouch, FNP-C [facility] [phone]

## 2022-09-27 NOTE — Patient Instructions (Addendum)

## 2022-09-28 ENCOUNTER — Encounter: Payer: Self-pay | Admitting: Family Medicine

## 2022-09-28 LAB — VITAMIN B12: Vitamin B-12: 1344 pg/mL — ABNORMAL HIGH (ref 232–1245)

## 2022-09-28 LAB — CMP14+EGFR
ALT: 15 IU/L (ref 0–32)
AST: 16 IU/L (ref 0–40)
Albumin/Globulin Ratio: 1.8 (ref 1.2–2.2)
Albumin: 4.8 g/dL (ref 3.8–4.8)
Alkaline Phosphatase: 90 IU/L (ref 44–121)
BUN/Creatinine Ratio: 16 (ref 12–28)
BUN: 18 mg/dL (ref 8–27)
Bilirubin Total: 0.8 mg/dL (ref 0.0–1.2)
CO2: 21 mmol/L (ref 20–29)
Calcium: 10.3 mg/dL (ref 8.7–10.3)
Chloride: 102 mmol/L (ref 96–106)
Creatinine, Ser: 1.11 mg/dL — ABNORMAL HIGH (ref 0.57–1.00)
Globulin, Total: 2.6 g/dL (ref 1.5–4.5)
Glucose: 141 mg/dL — ABNORMAL HIGH (ref 70–99)
Potassium: 4.7 mmol/L (ref 3.5–5.2)
Sodium: 140 mmol/L (ref 134–144)
Total Protein: 7.4 g/dL (ref 6.0–8.5)
eGFR: 53 mL/min/{1.73_m2} — ABNORMAL LOW (ref >59)

## 2022-09-28 LAB — LIPID PANEL
Chol/HDL Ratio: 4.3 ratio (ref 0.0–4.4)
Cholesterol, Total: 116 mg/dL (ref 100–199)
HDL: 27 mg/dL — ABNORMAL LOW (ref >39)
LDL Chol Calc (NIH): 40 mg/dL (ref 0–99)
Triglycerides: 326 mg/dL — ABNORMAL HIGH (ref 0–149)
VLDL Cholesterol Cal: 49 mg/dL — ABNORMAL HIGH (ref 5–40)

## 2022-09-28 LAB — IRON,TIBC AND FERRITIN PANEL
Ferritin: 165 ng/mL — ABNORMAL HIGH (ref 15–150)
Iron Saturation: 31 % (ref 15–55)
Iron: 85 ug/dL (ref 27–139)
Total Iron Binding Capacity: 272 ug/dL (ref 250–450)
UIBC: 187 ug/dL (ref 118–369)

## 2022-09-28 LAB — THYROID PANEL WITH TSH
Free Thyroxine Index: 1.8 (ref 1.2–4.9)
T3 Uptake Ratio: 26 % (ref 24–39)
T4, Total: 7.1 ug/dL (ref 4.5–12.0)
TSH: 6.45 u[IU]/mL — ABNORMAL HIGH (ref 0.450–4.500)

## 2022-09-28 LAB — FOLATE: Folate: 11.6 ng/mL (ref >3.0)

## 2022-09-28 LAB — VITAMIN D 25 HYDROXY (VIT D DEFICIENCY, FRACTURES): Vit D, 25-Hydroxy: 32.1 ng/mL (ref 30.0–100.0)

## 2022-09-29 ENCOUNTER — Encounter (HOSPITAL_COMMUNITY): Payer: Self-pay

## 2022-09-29 NOTE — Telephone Encounter (Signed)
Unable to locate referral to [facility] Will call office to confirm referral process  Main: [phone] Fax: [phone]   Will clarify with provider pet scan order (imaging vs diagnotic)

## 2022-10-02 ENCOUNTER — Telehealth (HOSPITAL_COMMUNITY): Payer: Self-pay | Admitting: *Deleted

## 2022-10-02 NOTE — Telephone Encounter (Signed)
Reaching out to patient to offer assistance regarding upcoming cardiac imaging study; pt verbalizes understanding of appt date/time, parking situation and where to check in, pre-test NPO status  and verified current allergies; name and call back number provided for further questions should they arise  Becky Clement RN Navigator Cardiac Imaging [facility] [phone] office [phone] cell  Patient aware to avoid caffeine 12 hours prior to her cardiac PET scan.

## 2022-10-03 ENCOUNTER — Ambulatory Visit
Admission: RE | Admit: 2022-10-03 | Discharge: 2022-10-03 | Disposition: A | Discharge status: Home / Self Care | Payer: No Typology Code available for payment source | Source: Ambulatory Visit | Attending: Nurse Practitioner | Admitting: Nurse Practitioner

## 2022-10-03 ENCOUNTER — Other Ambulatory Visit: Payer: Self-pay | Admitting: *Deleted

## 2022-10-03 DIAGNOSIS — E1165 Type 2 diabetes mellitus with hyperglycemia: Secondary | ICD-10-CM

## 2022-10-03 DIAGNOSIS — Z1231 Encounter for screening mammogram for malignant neoplasm of breast: Secondary | ICD-10-CM

## 2022-10-03 MED ORDER — METFORMIN HCL 500 MG PO TABS: ORAL_TABLET | ORAL | 0 refills | Status: DC

## 2022-10-03 NOTE — Telephone Encounter (Signed)
Fax from [facility] for 100-d script for Metformin send to pt's pharmacy

## 2022-10-04 ENCOUNTER — Encounter (HOSPITAL_COMMUNITY)
Admission: RE | Admit: 2022-10-04 | Discharge: 2022-10-04 | Disposition: A | Discharge status: Home / Self Care | Payer: No Typology Code available for payment source | Source: Ambulatory Visit | Attending: Cardiology | Admitting: Cardiology

## 2022-10-04 DIAGNOSIS — I5042 Chronic combined systolic (congestive) and diastolic (congestive) heart failure: Secondary | ICD-10-CM | POA: Diagnosis not present

## 2022-10-04 LAB — NM PET CT CARDIAC PERFUSION MULTI W/ABSOLUTE BLOODFLOW
MBFR: 1.86
Nuc Rest EF: 60 %
Nuc Stress EF: 64 %
Rest MBF: 0.92 ml/g/min
Rest Nuclear Isotope Dose: 20.4 mCi
ST Depression (mm): 0 mm
Stress MBF: 1.71 ml/g/min
Stress Nuclear Isotope Dose: 20.5 mCi
TID: 1.19

## 2022-10-04 MED ORDER — REGADENOSON 0.4 MG/5ML IV SOLN
INTRAVENOUS | Status: AC
  Filled 2022-10-04: qty 5

## 2022-10-04 MED ORDER — RUBIDIUM RB82 GENERATOR (RUBYFILL)
20.4000 | PACK | Freq: Once | INTRAVENOUS | Status: AC
  Administered 2022-10-04: 20.4 via INTRAVENOUS

## 2022-10-04 MED ORDER — RUBIDIUM RB82 GENERATOR (RUBYFILL)
20.7000 | PACK | Freq: Once | INTRAVENOUS | Status: AC
  Administered 2022-10-04: 20.7 via INTRAVENOUS

## 2022-10-06 ENCOUNTER — Telehealth (HOSPITAL_COMMUNITY): Payer: Self-pay

## 2022-10-06 NOTE — Telephone Encounter (Signed)
Patient aware of PET results.

## 2022-10-07 ENCOUNTER — Other Ambulatory Visit (HOSPITAL_COMMUNITY): Payer: Self-pay | Admitting: Cardiology

## 2022-10-10 MED FILL — Regadenoson IV Inj 0.4 MG/5ML (0.08 MG/ML): INTRAVENOUS | Qty: 5 | Status: AC

## 2022-10-11 DIAGNOSIS — H524 Presbyopia: Secondary | ICD-10-CM | POA: Diagnosis not present

## 2022-10-11 LAB — HM DIABETES EYE EXAM

## 2022-10-13 ENCOUNTER — Ambulatory Visit: Payer: No Typology Code available for payment source | Admitting: Family Medicine

## 2022-10-13 ENCOUNTER — Encounter: Payer: Self-pay | Admitting: Family Medicine

## 2022-10-13 VITALS — BP 150/63 | HR 61 | Temp 96.6°F | Ht 63.0 in | Wt 174.0 lb

## 2022-10-13 DIAGNOSIS — H6123 Impacted cerumen, bilateral: Secondary | ICD-10-CM | POA: Diagnosis not present

## 2022-10-13 NOTE — Progress Notes (Signed)
Subjective:  Patient ID: Becky Boyle, female    DOB: April 25, 1951, 72 y.o.   MRN: [id number]  Patient Care Team: Baruch Gouty, FNP as PCP - General (Family Medicine)   Chief Complaint:  2 Week Follow-up (Ear re check )   HPI: Becky Boyle is a 72 y.o. female presenting on 10/13/2022 for 2 Week Follow-up (Ear re check )   Pt presents today for cerumen removal. Has been using Debrox in both ears for the last 2 weeks. States the wax has been draining from her left ear. No drainage from the right. Denies pain or changes in hearing.     Relevant past medical, surgical, family, and social history reviewed and updated as indicated.  Allergies and medications reviewed and updated. Data reviewed: Chart in Epic.   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[place])    CKD (chronic kidney disease) stage 3, GFR 30-59 ml/min (HCC)    Depression    Diabetes ([place])    Generalized anxiety disorder with panic attacks 09/24/2020   Heart failure ([place])    History of non-ST elevation myocardial infarction (NSTEMI)    Vitamin B12 deficiency 11/04/2021    Past Surgical History:  Procedure Laterality Date   ABDOMINAL HYSTERECTOMY     BRAIN SURGERY  1989   tumor    CORONARY STENT INTERVENTION N/A 02/17/2020   Procedure: CORONARY STENT INTERVENTION;  Surgeon: Jettie Booze, MD;  Location: [facility];  Service: Cardiovascular;  Laterality: N/A;   RIGHT/LEFT HEART CATH AND CORONARY ANGIOGRAPHY N/A 02/17/2020   Procedure: RIGHT/LEFT HEART CATH AND CORONARY ANGIOGRAPHY;  Surgeon: Jettie Booze, MD;  Location: [facility];  Service: Cardiovascular;  Laterality: N/A;    Social History   Socioeconomic History   Marital status: Married    Spouse name: Not on file   Number of children: 1   Years of education: Not on file   Highest education level: Not on file  Occupational History   Occupation: retired  Tobacco Use    Smoking status: Never   Smokeless tobacco: Never  Vaping Use   Vaping Use: Never used  Substance and Sexual Activity   Alcohol use: No   Drug use: No   Sexual activity: Not on file  Other Topics Concern   Not on file  Social History Narrative   Son living with them right now   Social Determinants of Health   Financial Resource Strain: Low Risk  (07/18/2022)   Overall Financial Resource Strain (CARDIA)    Difficulty of Paying Living Expenses: Not hard at all  Food Insecurity: No Food Insecurity (07/18/2022)   Hunger Vital Sign    Worried About Running Out of Food in the Last Year: Never true    [place] in the Last Year: Never true  Transportation Needs: No Transportation Needs (07/18/2022)   PRAPARE - Hydrologist (Medical): No    Lack of Transportation (Non-Medical): No  Physical Activity: Insufficiently Active (07/18/2022)   Exercise Vital Sign    Days of Exercise per Week: 3 days    Minutes of Exercise per Session: 30 min  Stress: No Stress Concern Present (07/18/2022)   [place]    Feeling of Stress : Not at all  Social Connections: [place] (07/18/2022)   Social Connection and  Isolation Panel [NHANES]    Frequency of Communication with Friends and Family: More than three times a week    Frequency of Social Gatherings with Friends and Family: More than three times a week    Attends Religious Services: More than 4 times per year    Active Member of [employer] or Organizations: Yes    Attends Archivist Meetings: 1 to 4 times per year    Marital Status: Married  Human resources officer Violence: Not At Risk (07/18/2022)   Humiliation, Afraid, Rape, and Kick questionnaire    Fear of Current or Ex-Partner: No    Emotionally Abused: No    Physically Abused: No    Sexually Abused: No    Outpatient Encounter Medications as of 10/13/2022  Medication Sig    atorvastatin (LIPITOR) 40 MG tablet TAKE ONE TABLET DAILY   carvedilol (COREG) 12.5 MG tablet TAKE 1 TABLET 2 TIMES A DAY WITH A MEAL   cetirizine (ZYRTEC) 10 MG tablet Take 10 mg by mouth as needed for allergies.   Cholecalciferol (VITAMIN D3) 50 MCG (2000 UT) capsule Take 1 capsule (2,000 Units total) by mouth daily. (Patient taking differently: Take 500 Units by mouth daily.)   clopidogrel (PLAVIX) 75 MG tablet TAKE ONE TABLET ONCE DAILY   Cyanocobalamin (VITAMIN B 12 PO) Take 1 tablet by mouth daily.   dapagliflozin propanediol (FARXIGA) 10 MG TABS tablet Take 1 tablet (10 mg total) by mouth daily before breakfast.   furosemide (LASIX) 20 MG tablet Take 1 tablet (20 mg total) by mouth daily as needed. (NEEDS TO BE SEEN BEFORE NEXT REFILL)   Lancets (ONETOUCH DELICA PLUS 123XX123) MISC Apply 1 each topically daily.   levothyroxine (SYNTHROID) 25 MCG tablet Take 1 tablet (25 mcg total) by mouth daily before breakfast.   metFORMIN (GLUCOPHAGE) 500 MG tablet TAKE ONE TABLET TWICE DAILY WITH FOOD   nitroGLYCERIN (NITROSTAT) 0.4 MG SL tablet Place 1 tablet (0.4 mg total) under the tongue every 5 (five) minutes x 3 doses as needed for chest pain.   ONETOUCH VERIO test strip CHECK BLOOD SUGAR ONCE DAILY OR AS DIRECTED   sacubitril-valsartan (ENTRESTO) 97-103 MG Take 1 tablet by mouth 2 (two) times daily.   Semaglutide,0.25 or 0.5MG /DOS, 2 MG/3ML SOPN Inject 0.25 mg into the skin once a week.   sodium chloride (OCEAN) 0.65 % SOLN nasal spray Place 1 spray into both nostrils as needed for congestion.   spironolactone (ALDACTONE) 25 MG tablet Take 1 tablet (25 mg total) by mouth daily.   No facility-administered encounter medications on file as of 10/13/2022.    Allergies  Allergen Reactions   Latex Anaphylaxis   Penicillins Hives   Codeine Rash   Tape Rash    Review of Systems  HENT:         Ear fullness and wax buildup  All other systems reviewed and are negative.       Objective:   BP (!) 150/63   Pulse 61   Temp (!) 96.6 F (35.9 C) (Temporal)   Ht 5\' 3"  (1.6 m)   Wt 174 lb (78.9 kg)   BMI 30.82 kg/m    Wt Readings from Last 3 Encounters:  10/13/22 174 lb (78.9 kg)  09/27/22 171 lb 6.4 oz (77.7 kg)  09/05/22 173 lb 12.8 oz (78.8 kg)    Physical Exam Vitals and nursing note reviewed.  Constitutional:      Appearance: Normal appearance.  HENT:     Head: Normocephalic and atraumatic.  Right Ear: There is impacted cerumen.     Left Ear: There is impacted cerumen.     Mouth/Throat:     Mouth: Mucous membranes are moist.  Eyes:     Pupils: Pupils are equal, round, and reactive to light.  Cardiovascular:     Rate and Rhythm: Normal rate.  Pulmonary:     Effort: Pulmonary effort is normal.  Skin:    General: Skin is warm and dry.     Capillary Refill: Capillary refill takes less than 2 seconds.  Neurological:     General: No focal deficit present.     Mental Status: She is alert and oriented to person, place, and time.  Psychiatric:        Mood and Affect: Mood normal.        Behavior: Behavior normal.        Thought Content: Thought content normal.        Judgment: Judgment normal.     Results for orders placed or performed during the hospital encounter of 10/04/22  NM PET CT CARDIAC PERFUSION MULTI W/ABSOLUTE BLOODFLOW  Result Value Ref Range   Rest Nuclear Isotope Dose 20.4 mCi   Stress Nuclear Isotope Dose 20.5 mCi   ST Depression (mm) 0 mm   TID 1.19    Nuc Stress EF 64 %   Nuc Rest EF 60 %   Rest MBF 0.92 ml/g/min   Stress MBF 1.71 ml/g/min   MBFR 1.86      Ear Cerumen Removal  Date/Time: 10/13/2022 3:50 PM  Performed by: Baruch Gouty, FNP Authorized by: Baruch Gouty, FNP   Anesthesia: Local Anesthetic: none Ceruminolytics applied: Ceruminolytics applied prior to the procedure. Location details: right ear Patient tolerance: patient tolerated the procedure well with no immediate complications Procedure type: irrigation   Sedation: Patient sedated: no    Ear Cerumen Removal  Date/Time: 10/13/2022 3:50 PM  Performed by: Baruch Gouty, FNP Authorized by: Baruch Gouty, FNP   Anesthesia: Local Anesthetic: none Ceruminolytics applied: Ceruminolytics applied prior to the procedure. Location details: left ear Patient tolerance: patient tolerated the procedure well with no immediate complications Procedure type: irrigation  Sedation: Patient sedated: no       Pertinent labs & imaging results that were available during my care of the patient were reviewed by me and considered in my medical decision making.  Assessment & Plan:  Graviela was seen today for 2 week follow-up.  Diagnoses and all orders for this visit:  Bilateral impacted cerumen     Continue all other maintenance medications.  Follow up plan: Return if symptoms worsen or fail to improve.   Continue healthy lifestyle choices, including diet (rich in fruits, vegetables, and lean proteins, and low in salt and simple carbohydrates) and exercise (at least 30 minutes of moderate physical activity daily).  Educational handout given for ear irrigation   The above assessment and management plan was discussed with the patient. The patient verbalized understanding of and has agreed to the management plan. Patient is aware to call the clinic if they develop any new symptoms or if symptoms persist or worsen. Patient is aware when to return to the clinic for a follow-up visit. Patient educated on when it is appropriate to go to the emergency department.   Monia Pouch, FNP-C [facility] [phone]

## 2022-11-02 ENCOUNTER — Ambulatory Visit (HOSPITAL_BASED_OUTPATIENT_CLINIC_OR_DEPARTMENT_OTHER)
Admission: RE | Admit: 2022-11-02 | Discharge: 2022-11-02 | Disposition: A | Discharge status: Home / Self Care | Payer: No Typology Code available for payment source | Source: Ambulatory Visit | Attending: Family Medicine | Admitting: Family Medicine

## 2022-11-02 ENCOUNTER — Ambulatory Visit (INDEPENDENT_AMBULATORY_CARE_PROVIDER_SITE_OTHER): Payer: No Typology Code available for payment source | Admitting: Family Medicine

## 2022-11-02 ENCOUNTER — Other Ambulatory Visit: Payer: Self-pay | Admitting: Family Medicine

## 2022-11-02 ENCOUNTER — Encounter: Payer: Self-pay | Admitting: Family Medicine

## 2022-11-02 VITALS — BP 118/64 | HR 77 | Temp 98.9°F | Ht 63.0 in | Wt 173.2 lb

## 2022-11-02 DIAGNOSIS — M7989 Other specified soft tissue disorders: Secondary | ICD-10-CM | POA: Insufficient documentation

## 2022-11-02 DIAGNOSIS — M79605 Pain in left leg: Secondary | ICD-10-CM | POA: Diagnosis not present

## 2022-11-02 DIAGNOSIS — E039 Hypothyroidism, unspecified: Secondary | ICD-10-CM | POA: Diagnosis not present

## 2022-11-02 DIAGNOSIS — Z1159 Encounter for screening for other viral diseases: Secondary | ICD-10-CM

## 2022-11-02 DIAGNOSIS — E1165 Type 2 diabetes mellitus with hyperglycemia: Secondary | ICD-10-CM

## 2022-11-02 DIAGNOSIS — Z7689 Persons encountering health services in other specified circumstances: Secondary | ICD-10-CM

## 2022-11-02 LAB — MICROALBUMIN / CREATININE URINE RATIO
Creatinine,U: 110.7 mg/dL
Microalb Creat Ratio: 1.6 mg/g (ref 0.0–30.0)
Microalb, Ur: 1.8 mg/dL (ref 0.0–1.9)

## 2022-11-02 MED ORDER — ROPINIROLE HCL 0.5 MG PO TABS: 0.5000 mg | ORAL_TABLET | Freq: Every day | ORAL | 0 refills | Status: DC

## 2022-11-02 NOTE — Patient Instructions (Addendum)
It was a pleasure to meet you today and I look forward to taking care of you. -I have provided a handout about taking Levothyroxine correctly. With taking it correctly, it may change your TSH levels initially, therefore we may need to change your dose on medication.  -I have ordered a STAT ultrasound of left lower leg to rule out a blood clot. The referral team with [facility] will be calling you with an appointment. I will let you know the results once I get them back.  -We are going to hold on starting Ropinirole until we get the ultrasound back.  -We will make an appointment to follow up based on DVT ultrasound.  -Ordered your Hep C lab today. -Ordered your urine to be checked due to diabetes.

## 2022-11-02 NOTE — Progress Notes (Signed)
New Patient Office Visit  Subjective    Patient ID: Becky Boyle, female    DOB: May 28, 1951  Age: 72 y.o. MRN: [id number]  CC:  Chief Complaint  Patient presents with   Establish Care    Pt is here today to [facility]. Pt reports she has pain in both legs, more on left leg. Describes this as a achy feeling. She has been waking up at night with this. OTC Aleve. Pt reports she had Ultra sound on her leg and nothing was found. Pt is FASTING today Pt has her B/P cuff with her today to and her cuff is reading 136/80.    HPI Becky Boyle presents to establish care with new provider.   Patient reports her previous provider was Darla Lesches, NP at [facility].   Specialist:  [facility]-Dr. Loralie Champagne. [facility] in [place] with Dr. Ronnald Ramp, had diabetic eye exam about 2 weeks ago;   Left leg pain: Patient reports pain started about 3 months ago, the last 2 weeks the pain has increased. She reports her pain is mostly in her left calf. She relates the pain as an ear ache or toothache. Pain is only at night time. She reports she has tried to get up walking, soaking in warm water, massaging the area, took Aleve, and applied Icy Hot to the area with no relief. She reports when she lays down, the pain will start. She does have the pain a little bit during the day. Currently, in pain during visit. Denies any numbness and tingling. Iron levels have been checked on 02/28, no anemia, just ferritin level is elevated. She had a lower extremity doppler study in Feb with cardiology, ABIs are normal, TBIs abnormal suggesting some PAD far distal in lower extremities. Denies any chest pain or shortness of breath. She has not noticed edema in her calf.   Outpatient Encounter Medications as of 11/02/2022  Medication Sig   atorvastatin (LIPITOR) 40 MG tablet TAKE ONE TABLET DAILY   calcium-vitamin D (OSCAL WITH D) 500-5 MG-MCG tablet Take 1 tablet by  mouth daily with breakfast.   carvedilol (COREG) 12.5 MG tablet TAKE 1 TABLET 2 TIMES A DAY WITH A MEAL   cetirizine (ZYRTEC) 10 MG tablet Take 10 mg by mouth as needed for allergies.   clopidogrel (PLAVIX) 75 MG tablet TAKE ONE TABLET ONCE DAILY   Cyanocobalamin (VITAMIN B 12 PO) Take 1 tablet by mouth daily.   dapagliflozin propanediol (FARXIGA) 10 MG TABS tablet Take 1 tablet (10 mg total) by mouth daily before breakfast.   furosemide (LASIX) 20 MG tablet Take 1 tablet (20 mg total) by mouth daily as needed. (NEEDS TO BE SEEN BEFORE NEXT REFILL)   Lancets (ONETOUCH DELICA PLUS 123XX123) MISC Apply 1 each topically daily.   levothyroxine (SYNTHROID) 25 MCG tablet Take 1 tablet (25 mcg total) by mouth daily before breakfast.   metFORMIN (GLUCOPHAGE) 500 MG tablet TAKE ONE TABLET TWICE DAILY WITH FOOD   nitroGLYCERIN (NITROSTAT) 0.4 MG SL tablet Place 1 tablet (0.4 mg total) under the tongue every 5 (five) minutes x 3 doses as needed for chest pain.   ONETOUCH VERIO test strip CHECK BLOOD SUGAR ONCE DAILY OR AS DIRECTED   sacubitril-valsartan (ENTRESTO) 97-103 MG Take 1 tablet by mouth 2 (two) times daily.   sodium chloride (OCEAN) 0.65 % SOLN nasal spray Place 1 spray into both nostrils as needed for congestion.   spironolactone (ALDACTONE)  25 MG tablet Take 1 tablet (25 mg total) by mouth daily.   [DISCONTINUED] Cholecalciferol (VITAMIN D3) 50 MCG (2000 UT) capsule Take 1 capsule (2,000 Units total) by mouth daily. (Patient taking differently: Take 500 Units by mouth daily.)   [DISCONTINUED] rOPINIRole (REQUIP) 0.5 MG tablet Take 1 tablet (0.5 mg total) by mouth at bedtime.   [DISCONTINUED] Semaglutide,0.25 or 0.5MG /DOS, 2 MG/3ML SOPN Inject 0.25 mg into the skin once a week.   No facility-administered encounter medications on file as of 11/02/2022.    Past Medical History:  Diagnosis Date   Anxiety    with panic attacks   Benign brain tumor    CKD (chronic kidney disease) stage 3, GFR  30-59 ml/min    Depression    Diabetes    Generalized anxiety disorder with panic attacks 09/24/2020   Heart failure    History of non-ST elevation myocardial infarction (NSTEMI)    Vitamin B12 deficiency 11/04/2021   Vitamin D deficiency     Past Surgical History:  Procedure Laterality Date   ABDOMINAL HYSTERECTOMY     asemia     BRAIN SURGERY  1989   tumor    CORONARY STENT INTERVENTION N/A 02/17/2020   Procedure: CORONARY STENT INTERVENTION;  Surgeon: Jettie Booze, MD;  Location: [facility];  Service: Cardiovascular;  Laterality: N/A;   RIGHT/LEFT HEART CATH AND CORONARY ANGIOGRAPHY N/A 02/17/2020   Procedure: RIGHT/LEFT HEART CATH AND CORONARY ANGIOGRAPHY;  Surgeon: Jettie Booze, MD;  Location: [facility];  Service: Cardiovascular;  Laterality: N/A;    Family History  Problem Relation Age of Onset   Alzheimer's disease Mother    Cancer Father        Prostate to bone   Diverticulitis Sister    Diabetes Son    Cancer Son    Heart disease Son    High blood pressure Son    Heart attack Son    Hypertension Son     Social History   Socioeconomic History   Marital status: Married    Spouse name: Not on file   Number of children: 4   Years of education: Not on file   Highest education level: Not on file  Occupational History   Occupation: retired  Tobacco Use   Smoking status: Never   Smokeless tobacco: Never  Vaping Use   Vaping Use: Never used  Substance and Sexual Activity   Alcohol use: No   Drug use: No   Sexual activity: Not Currently  Other Topics Concern   Not on file  Social History Narrative   Patient lives with spouse. 2 dogs.    Social Determinants of Health   Financial Resource Strain: Low Risk  (07/18/2022)   Overall Financial Resource Strain (CARDIA)    Difficulty of Paying Living Expenses: Not hard at all  Food Insecurity: No Food Insecurity (07/18/2022)   Hunger Vital Sign    Worried About Running Out of  Food in the Last Year: Never true    Ran Out of Food in the Last Year: Never true  Transportation Needs: No Transportation Needs (07/18/2022)   PRAPARE - Hydrologist (Medical): No    Lack of Transportation (Non-Medical): No  Physical Activity: Insufficiently Active (07/18/2022)   Exercise Vital Sign    Days of Exercise per Week: 3 days    Minutes of Exercise per Session: 30 min  Stress: No Stress Concern Present (07/18/2022)   [employer]  Occupational Health - Occupational Stress Questionnaire    Feeling of Stress : Not at all  Social Connections: Socially Integrated (07/18/2022)   Social Connection and Isolation Panel [NHANES]    Frequency of Communication with Friends and Family: More than three times a week    Frequency of Social Gatherings with Friends and Family: More than three times a week    Attends Religious Services: More than 4 times per year    Active Member of [employer] or Organizations: Yes    Attends Archivist Meetings: 1 to 4 times per year    Marital Status: Married  Human resources officer Violence: Not At Risk (07/18/2022)   Humiliation, Afraid, Rape, and Kick questionnaire    Fear of Current or Ex-Partner: No    Emotionally Abused: No    Physically Abused: No    Sexually Abused: No   ROS See HPI above    Objective    BP 118/64   Pulse 77   Temp 98.9 F (37.2 C)   Ht 5\' 3"  (1.6 m)   Wt 173 lb 4 oz (78.6 kg)   SpO2 98%   BMI 30.69 kg/m   Physical Exam Vitals reviewed.  Constitutional:      General: She is not in acute distress.    Appearance: She is obese. She is not ill-appearing, toxic-appearing or diaphoretic.  Cardiovascular:     Rate and Rhythm: Normal rate and regular rhythm.     Pulses:          Dorsalis pedis pulses are 3+ on the right side and 3+ on the left side.     Heart sounds: Normal heart sounds. No murmur heard.    No friction rub. No gallop.     Comments: Only left calf is swollen compared  to right. No redness noted. Tenderness on palpation of anterior lower leg.  Musculoskeletal:        General: Normal range of motion.     Left lower leg: Edema present.  Skin:    General: Skin is warm and dry.  Neurological:     General: No focal deficit present.     Mental Status: She is alert and oriented to person, place, and time. Mental status is at baseline.  Psychiatric:        Mood and Affect: Mood normal.        Behavior: Behavior normal.        Thought Content: Thought content normal.        Judgment: Judgment normal.      Assessment & Plan:  Left leg pain -     US Venous Img Lower Unilateral Right (DVT); Future  Left leg swelling -     US Venous Img Lower Unilateral Right (DVT); Future  Type 2 diabetes mellitus with hyperglycemia, without long-term current use of insulin -     Microalbumin / creatinine urine ratio  Encounter for hepatitis C screening test for low risk patient -     Hepatitis C antibody  Acquired hypothyroidism  Encounter to establish care  1.Review health maintenance: -Requesting records from [facility] in [place] for ophthalmology exam.  -Ordered microalbumin/creat ratio since patient has diabetes.  -Ordered Hep C lab for screening.  2.When reviewing over medications, patient reported she had not been taking Levothyroxine correctly. She has been taking with all of her morning medication, including vitamins. Discussed about how to take medication correctly. Provided written material and highlighted on correct way to take Levothyroxine. She expressed understanding  and would like to change how she is taking medication. Also, informed her this may change her TSH levels and may need to change dosage. She last had her TSH checked in Feb. 3. Ordered a STAT ultrasound of left lower leg to rule out a DVT. She complains of pain becoming worse in the last 2 weeks since it initially started a month ago. She reports pain mostly occurs at night, but some during  the pain. Present pain in office. Swelling noted to calf with no redness. She is unsure when this occurred, she denies noticing edema. Left lower doppler study completed by cardiology and assessment of anemia has already been completed with no definitive answer for pain. Initially was going to prescribe Ropinirole for restless leg pain, but decided against with edema noted to calf.  4.Will make an appointment to follow up based on DVT ultrasound.   No follow-ups on file.   Valarie Merino, NP

## 2022-11-03 ENCOUNTER — Other Ambulatory Visit: Payer: Self-pay | Admitting: Family Medicine

## 2022-11-03 ENCOUNTER — Other Ambulatory Visit: Payer: Self-pay

## 2022-11-03 ENCOUNTER — Ambulatory Visit: Payer: No Typology Code available for payment source | Admitting: Nurse Practitioner

## 2022-11-03 ENCOUNTER — Telehealth: Payer: Self-pay

## 2022-11-03 DIAGNOSIS — E039 Hypothyroidism, unspecified: Secondary | ICD-10-CM

## 2022-11-03 DIAGNOSIS — G2581 Restless legs syndrome: Secondary | ICD-10-CM

## 2022-11-03 LAB — HEPATITIS C ANTIBODY: Hepatitis C Ab: NONREACTIVE

## 2022-11-03 MED ORDER — ROPINIROLE HCL 0.5 MG PO TABS: 0.5000 mg | ORAL_TABLET | Freq: Every day | ORAL | 0 refills | Status: DC

## 2022-11-03 MED ORDER — ROPINIROLE HCL 0.25 MG PO TABS: 0.5000 mg | ORAL_TABLET | Freq: Every day | ORAL | Status: DC

## 2022-11-03 NOTE — Telephone Encounter (Signed)
-----   Message from Alveria Apley, NP sent at 11/03/2022  4:08 PM EDT ----- Hep C is non reactive.

## 2022-11-03 NOTE — Telephone Encounter (Signed)
Informed pt of lab results  

## 2022-11-06 ENCOUNTER — Telehealth: Payer: Self-pay

## 2022-11-06 NOTE — Telephone Encounter (Signed)
-----   Message from Alveria Apley, NP sent at 11/06/2022  7:54 AM EDT ----- Please call patient to let her know that I have been in touch with cardiology. He thinks it could be restless leg syndrome. He doubts due to PAD, peripheral artery disease seen on her scan he had ordered.  ----- Message ----- From: Laurey Morale, MD Sent: 11/05/2022   8:59 PM EDT To: Alveria Apley, NP  May be restless leg syndrome, doubt due to PAD.   ----- Message ----- From: Alveria Apley, NP Sent: 11/05/2022   3:35 PM EDT To: Laurey Morale, MD  Good afternoon,  I am asking for advice or any more recommendations on this mutual patient we have together. I established care with her on Thursday. She is complaining of left leg pain, mostly at night, but some during the same. Noticeable edema only in the calf without redness. I sent her to rule out a DVT, which was negative. I seen where you had siad her ABIs are normal, but TBIs are abnormal. Could this be the cause of her pain and does she need to follow up with you or someone else with cardiovascular. Also, I started Ropinirole at night to see if this would help with the pain since she does not have DVT. Thank you for time.

## 2022-11-09 ENCOUNTER — Other Ambulatory Visit: Payer: Self-pay | Admitting: Family Medicine

## 2022-11-09 ENCOUNTER — Other Ambulatory Visit: Payer: Self-pay

## 2022-11-09 DIAGNOSIS — E782 Mixed hyperlipidemia: Secondary | ICD-10-CM

## 2022-11-17 ENCOUNTER — Other Ambulatory Visit: Payer: Self-pay | Admitting: Family Medicine

## 2022-11-17 DIAGNOSIS — G2581 Restless legs syndrome: Secondary | ICD-10-CM

## 2022-11-17 MED ORDER — ROPINIROLE HCL 0.5 MG PO TABS
0.5000 mg | ORAL_TABLET | Freq: Every day | ORAL | 0 refills | Status: DC
Start: 2022-11-17 — End: 2022-11-21

## 2022-11-17 NOTE — Telephone Encounter (Signed)
Patient is requesting a refill of the following medications: Requested Prescriptions   Pending Prescriptions Disp Refills   rOPINIRole (REQUIP) 0.5 MG tablet 30 tablet 0    Sig: Take 1 tablet (0.5 mg total) by mouth at bedtime.    Date of patient request: 11/17/2022 Last office visit: 11/02/2022 Date of last refill: n/a Last refill amount:  Follow up time period per chart: 1/ month

## 2022-11-17 NOTE — Telephone Encounter (Signed)
Caller name: KELLEY POLINSKY  On DPR?: Yes  Call back number: [phone] (home)  Provider they see: Alveria Apley, NP  Reason for call: After doubling up on this med its working pt just wanted to let you know.  rOPINIRole (REQUIP) 0.5 MG tablet   PT NEEDS A REFILL ASAP .

## 2022-11-20 ENCOUNTER — Telehealth: Payer: Self-pay | Admitting: Family Medicine

## 2022-11-20 ENCOUNTER — Encounter: Payer: Self-pay | Admitting: Family Medicine

## 2022-11-20 ENCOUNTER — Other Ambulatory Visit: Payer: Self-pay | Admitting: Family Medicine

## 2022-11-20 DIAGNOSIS — G2581 Restless legs syndrome: Secondary | ICD-10-CM

## 2022-11-20 NOTE — Telephone Encounter (Signed)
Is this ok to refill ? Pt states it is helping her

## 2022-11-20 NOTE — Telephone Encounter (Signed)
Pt called back stating the pharmacy wants a prescription doubling the amount.  [facility] - [place], [place] - [address] [[id number]]    rOPINIRole (REQUIP) 0.5 MG tablet 30 tablet 0      Sig: Take 1 tablet (0.5 mg total) by mouth at bedtime.

## 2022-11-21 ENCOUNTER — Other Ambulatory Visit: Payer: Self-pay

## 2022-11-21 ENCOUNTER — Encounter: Payer: Self-pay | Admitting: Family Medicine

## 2022-11-21 DIAGNOSIS — G2581 Restless legs syndrome: Secondary | ICD-10-CM

## 2022-11-21 MED ORDER — ROPINIROLE HCL 0.5 MG PO TABS
0.5000 mg | ORAL_TABLET | Freq: Every day | ORAL | 0 refills | Status: DC
Start: 2022-11-21 — End: 2022-11-22

## 2022-11-21 NOTE — Telephone Encounter (Signed)
Pt stated double dose worked well pended new dose below.

## 2022-11-22 ENCOUNTER — Other Ambulatory Visit: Payer: Self-pay

## 2022-11-22 DIAGNOSIS — G2581 Restless legs syndrome: Secondary | ICD-10-CM

## 2022-11-22 MED ORDER — ROPINIROLE HCL 0.5 MG PO TABS
0.5000 mg | ORAL_TABLET | Freq: Two times a day (BID) | ORAL | 0 refills | Status: DC
Start: 2022-11-22 — End: 2022-12-22

## 2022-11-22 NOTE — Telephone Encounter (Signed)
I have canceled all other rx with Pharmacy. Sent in 60 tablets 2x daily.  Called pt to make her aware of this.

## 2022-11-22 NOTE — Telephone Encounter (Signed)
Unsure what to do? Patient had sent message requesting the increased dose be sent in but appears same dose and directions sent not sure what was going on with her as previous message says handled by Becky Boyle what should I tell her?

## 2022-12-03 NOTE — Progress Notes (Unsigned)
   Established Patient Office Visit   Subjective:  Patient ID: Becky Boyle, female    DOB: 09/21/1950  Age: 72 y.o. MRN: [id number]  No chief complaint on file.   HPI Left leg pain: On previous visit, patient complained of left leg pain that had started 3 months prior, but had been becoming worse for about 2 weeks. She had a lower extremity doppler study in Feb with cardiology, ABIs were normal, TBIs abnormal suggesting some PAD far distal in the lower extremities. On visit, she had edema in calf. She had a left lower ultrasound to rule out DVT, which was negative. Suspect it was restless leg syndrome. Contacted cardiology, who also thinks this could be restless leg syndrome and not related to abnormal TBIs suggestive of PAD. After DVT study, started patient on Ropinirole 0.5mg  at night, then it was not as effective, increased to 1mg  at night time.    ROS See HPI above     Objective:     There were no vitals taken for this visit. {Vitals History (Optional):23777}  Physical Exam  No results found for any visits on 12/04/22.  The ASCVD Risk score (Arnett DK, et al., 2019) failed to calculate for the following reasons:   The patient has a prior MI or stroke diagnosis    Assessment & Plan:  There are no diagnoses linked to this encounter.  No follow-ups on file.   Zandra Abts, NP

## 2022-12-04 ENCOUNTER — Telehealth: Payer: Self-pay

## 2022-12-04 ENCOUNTER — Encounter: Payer: Self-pay | Admitting: Family Medicine

## 2022-12-04 ENCOUNTER — Ambulatory Visit: Payer: No Typology Code available for payment source | Admitting: Family Medicine

## 2022-12-04 VITALS — BP 148/72 | HR 75 | Temp 98.2°F | Ht 63.0 in

## 2022-12-04 DIAGNOSIS — Z0279 Encounter for issue of other medical certificate: Secondary | ICD-10-CM

## 2022-12-04 DIAGNOSIS — E1165 Type 2 diabetes mellitus with hyperglycemia: Secondary | ICD-10-CM | POA: Diagnosis not present

## 2022-12-04 DIAGNOSIS — G2581 Restless legs syndrome: Secondary | ICD-10-CM | POA: Diagnosis not present

## 2022-12-04 NOTE — Assessment & Plan Note (Signed)
Continue Ropinirole 0.5mg , 2 tablets for restless leg syndrome. Advised when she got a week out of medication, please call back to speak with Archie Patten, CMA to change Ropinirole to 1 mg tablet instead of taking 2 tablets of 0.5mg .

## 2022-12-04 NOTE — Assessment & Plan Note (Signed)
Completed foot exam today.

## 2022-12-04 NOTE — Telephone Encounter (Signed)
Form was filled out in office

## 2022-12-04 NOTE — Patient Instructions (Signed)
Continue Ropinirole 0.5mg , 2 tablets for restless leg syndrome. When you get a week out of medication, please call back to speak with Archie Patten, CMA to change Ropinirole to 1 mg tablet instead of taking 2 tablets of 0.5mg .  -Foot exam today. -Follow up in 2 months.

## 2022-12-06 ENCOUNTER — Other Ambulatory Visit: Payer: No Typology Code available for payment source

## 2022-12-22 ENCOUNTER — Other Ambulatory Visit: Payer: Self-pay

## 2022-12-22 ENCOUNTER — Telehealth: Payer: Self-pay | Admitting: Family Medicine

## 2022-12-22 DIAGNOSIS — G2581 Restless legs syndrome: Secondary | ICD-10-CM

## 2022-12-22 MED ORDER — ROPINIROLE HCL 0.5 MG PO TABS
0.5000 mg | ORAL_TABLET | Freq: Two times a day (BID) | ORAL | 0 refills | Status: DC
Start: 2022-12-22 — End: 2022-12-27

## 2022-12-22 NOTE — Telephone Encounter (Signed)
Encourage patient to contact the pharmacy for refills or they can request refills through [facility]   WHAT PHARMACY WOULD THEY LIKE THIS SENT TO:  [facility] [place], [place] - [address]  MEDICATION NAME & DOSE: rOPINIRole (REQUIP) 0.5 MG tablet  NOTES/COMMENTS FROM PATIENT:      Front office please notify patient: It takes 48-72 hours to process rx refill requests Ask patient to call pharmacy to ensure rx is ready before heading there.

## 2022-12-22 NOTE — Telephone Encounter (Signed)
Refill sent in

## 2022-12-26 ENCOUNTER — Encounter: Payer: Self-pay | Admitting: Family Medicine

## 2022-12-27 ENCOUNTER — Other Ambulatory Visit: Payer: Self-pay

## 2022-12-27 ENCOUNTER — Ambulatory Visit: Payer: No Typology Code available for payment source | Admitting: Family Medicine

## 2022-12-27 DIAGNOSIS — G2581 Restless legs syndrome: Secondary | ICD-10-CM

## 2022-12-27 MED ORDER — ROPINIROLE HCL 1 MG PO TABS
1.0000 mg | ORAL_TABLET | Freq: Every day | ORAL | 0 refills | Status: DC
Start: 2022-12-27 — End: 2023-04-09

## 2023-01-04 ENCOUNTER — Encounter (HOSPITAL_COMMUNITY): Payer: No Typology Code available for payment source

## 2023-01-16 ENCOUNTER — Other Ambulatory Visit (HOSPITAL_COMMUNITY): Payer: Self-pay | Admitting: Cardiology

## 2023-02-05 ENCOUNTER — Encounter: Payer: Self-pay | Admitting: Family Medicine

## 2023-02-05 ENCOUNTER — Ambulatory Visit (INDEPENDENT_AMBULATORY_CARE_PROVIDER_SITE_OTHER): Payer: No Typology Code available for payment source | Admitting: Family Medicine

## 2023-02-05 VITALS — BP 120/68 | HR 70 | Temp 98.7°F | Ht 64.0 in | Wt 174.0 lb

## 2023-02-05 DIAGNOSIS — G2581 Restless legs syndrome: Secondary | ICD-10-CM | POA: Diagnosis not present

## 2023-02-05 DIAGNOSIS — E1169 Type 2 diabetes mellitus with other specified complication: Secondary | ICD-10-CM

## 2023-02-05 DIAGNOSIS — E1122 Type 2 diabetes mellitus with diabetic chronic kidney disease: Secondary | ICD-10-CM

## 2023-02-05 DIAGNOSIS — E039 Hypothyroidism, unspecified: Secondary | ICD-10-CM | POA: Diagnosis not present

## 2023-02-05 DIAGNOSIS — N183 Chronic kidney disease, stage 3 unspecified: Secondary | ICD-10-CM | POA: Diagnosis not present

## 2023-02-05 DIAGNOSIS — E785 Hyperlipidemia, unspecified: Secondary | ICD-10-CM

## 2023-02-05 DIAGNOSIS — E1165 Type 2 diabetes mellitus with hyperglycemia: Secondary | ICD-10-CM

## 2023-02-05 DIAGNOSIS — E782 Mixed hyperlipidemia: Secondary | ICD-10-CM

## 2023-02-05 DIAGNOSIS — Z09 Encounter for follow-up examination after completed treatment for conditions other than malignant neoplasm: Secondary | ICD-10-CM

## 2023-02-05 DIAGNOSIS — M25511 Pain in right shoulder: Secondary | ICD-10-CM

## 2023-02-05 MED ORDER — ATORVASTATIN CALCIUM 40 MG PO TABS
40.0000 mg | ORAL_TABLET | Freq: Every day | ORAL | 0 refills | Status: DC
Start: 2023-02-05 — End: 2023-05-04

## 2023-02-05 NOTE — Progress Notes (Unsigned)
Established Patient Office Visit   Subjective:  Patient ID: Becky Boyle, female    DOB: 01/27/1951  Age: 72 y.o. MRN: [id number]  Chief Complaint  Patient presents with   restless leg    Pt is here today to F/U on restless leg. Pt reports sx for 5/16,5/17,5/18 and no sx since. Pt reports she has had some pain in RT shoulder and has been using a cream someone gave her and this helped. She has been getting her in and out of car. She thinks this is why it is hurting.     HPI On previous appointment, she was started on Ropinirole. She is taking 1 mg at night time with relief. Last time she had pain was in May. Bedsides 2-3 days of pain, medication has been effective.   Patient is complaining of right shoulder pain. Started all of a sudden. Denies any injury. Describe pain as a constant throbbing, dull pain. Intensity changes. She reports she has been applying a cream that someone gave her. She reports this has helped. She thinks the pain is related to picking up her grand daughter in her carrier. Denies any numbness or tingling.   Diabetes: Chronic. Patient is taking [place] 10mg  daily and Metformin 500mg  BID. She reports she monitors her blood sugar in the morning and late evening. Does not recall reading, but reports readings have been stable. Reports a little polydipsia. Denies polyuria and polyphagia.   Lab Results  Component Value Date   HGBA1C 7.5 (H) 09/27/2022     Hypothyroidism: Chronic. Patient is taking Levothyroxine daily. She reports since starting care at this practice she has been taking medication by itself with only water and awaiting before eating and drinking anything else. Last TSH was 6.450 on 09/27/2022. Denies any symptoms   Chronic Kidney Disease: Patient is taking [place] with dual therapy in protecting kidneys and diabetes. She does not see nephrology.   Hyperlipidemia: Chronic. Patient is taking Atorvastatin 40mg  daily. Patient denies any symptoms of  muscle aches. Followed by cardiology.  Lab Results  Component Value Date   CHOL 116 09/27/2022   HDL 27 (L) 09/27/2022   LDLCALC 40 09/27/2022   TRIG 326 (H) 09/27/2022   CHOLHDL 4.3 09/27/2022    ROS See HPI above     Objective:   BP 120/68   Pulse 70   Temp 98.7 F (37.1 C)   Ht 5\' 4"  (1.626 m)   Wt 174 lb (78.9 kg)   SpO2 98%   BMI 29.87 kg/m    Physical Exam Vitals reviewed.  Constitutional:      General: She is not in acute distress.    Appearance: Normal appearance. She is not ill-appearing, toxic-appearing or diaphoretic.  Eyes:     General:        Right eye: No discharge.        Left eye: No discharge.     Conjunctiva/sclera: Conjunctivae normal.  Cardiovascular:     Rate and Rhythm: Normal rate and regular rhythm.     Heart sounds: Normal heart sounds. No murmur heard.    No friction rub. No gallop.  Pulmonary:     Effort: Pulmonary effort is normal. No respiratory distress.     Breath sounds: Normal breath sounds.  Musculoskeletal:        General: Normal range of motion.     Right lower leg: No edema.     Left lower leg: No edema.     Comments: Right  shoulder: no bruising, no swelling/edema; full ROM; tenderness noted posterior and lateral shoulder;   Skin:    General: Skin is warm and dry.  Neurological:     General: No focal deficit present.     Mental Status: She is alert and oriented to person, place, and time. Mental status is at baseline.  Psychiatric:        Mood and Affect: Mood normal.        Behavior: Behavior normal.        Thought Content: Thought content normal.        Judgment: Judgment normal.      Assessment & Plan:  Type 2 diabetes mellitus with hyperglycemia, without long-term current use of insulin (HCC) Assessment & Plan: Continue with Farxiga 10mg  daily and Metformin 500mg  BID. Ordered A1c. Will adjust medication if needed based on A1c.   Orders: -     Hemoglobin A1c  Hyperlipidemia due to type 2 diabetes mellitus  (HCC) Assessment & Plan: Continue Atorvastatin 40mg  daily. Refilled medication. Patient is not fasting and has a cardiology appointment on 02/20/2023. She is going to see if cardiology will draw lipid panel then so she can be fasting with it being a morning appointment.   Orders: -     Atorvastatin Calcium; Take 1 tablet (40 mg total) by mouth daily.  Dispense: 90 tablet; Refill: 0  Restless legs Assessment & Plan: Stable and medication is effective to control pain. Continue with Ropinirole 1mg  daily.    Acute pain of right shoulder  Acquired hypothyroidism Assessment & Plan: Continue with Levothyroxine daily. Ordered TSH. Will change dose if needed depending on results.   Orders: -     TSH  CKD stage 3 due to type 2 diabetes mellitus (HCC) Assessment & Plan: Ordered CMP to assess kidney function. Continue with Farxiga 10mg  daily.   Orders: -     Comprehensive metabolic panel  Follow-up exam  1.Offered an x-ray of right shoulder. Patient declined. She would like to hold further treatment. She wants to continue with using OTC cream. Advised to follow up sooner if right shoulder pain becomes worse. Suspect this pain is muscle related.    Return in about 3 months (around 05/08/2023) for follow-up.   Zandra Abts, NP

## 2023-02-05 NOTE — Patient Instructions (Signed)
-  Continue all medications.  -Ordered labs. Office will call with lab results and you may see results in MyChart.  -If your right shoulder pain becomes worse, follow up. -Follow up in 3 months.

## 2023-02-06 ENCOUNTER — Telehealth: Payer: Self-pay

## 2023-02-06 ENCOUNTER — Other Ambulatory Visit: Payer: Self-pay

## 2023-02-06 DIAGNOSIS — N1832 Chronic kidney disease, stage 3b: Secondary | ICD-10-CM

## 2023-02-06 LAB — COMPREHENSIVE METABOLIC PANEL
ALT: 13 U/L (ref 0–35)
AST: 13 U/L (ref 0–37)
Albumin: 4.6 g/dL (ref 3.5–5.2)
Alkaline Phosphatase: 78 U/L (ref 39–117)
BUN: 21 mg/dL (ref 6–23)
CO2: 27 mEq/L (ref 19–32)
Calcium: 10.5 mg/dL (ref 8.4–10.5)
Chloride: 102 mEq/L (ref 96–112)
Creatinine, Ser: 1.18 mg/dL (ref 0.40–1.20)
GFR: 46.49 mL/min — ABNORMAL LOW (ref >60.00)
Glucose, Bld: 113 mg/dL — ABNORMAL HIGH (ref 70–99)
Potassium: 4.7 mEq/L (ref 3.5–5.1)
Sodium: 138 mEq/L (ref 135–145)
Total Bilirubin: 0.7 mg/dL (ref 0.2–1.2)
Total Protein: 7.7 g/dL (ref 6.0–8.3)

## 2023-02-06 LAB — HEMOGLOBIN A1C: Hgb A1c MFr Bld: 7.9 % — ABNORMAL HIGH (ref 4.6–6.5)

## 2023-02-06 LAB — TSH: TSH: 3.82 u[IU]/mL (ref 0.35–5.50)

## 2023-02-06 NOTE — Assessment & Plan Note (Addendum)
Continue Atorvastatin 40mg  daily. Refilled medication. Patient is not fasting and has a cardiology appointment on 02/20/2023. She is going to see if cardiology will draw lipid panel then so she can be fasting with it being a morning appointment.

## 2023-02-06 NOTE — Telephone Encounter (Signed)
I have informed pt of lab results . Nephrologist referral has been placed and repeat BMP order has been placed . Lab only visit has been made

## 2023-02-06 NOTE — Assessment & Plan Note (Signed)
Stable and medication is effective to control pain. Continue with Ropinirole 1mg  daily.

## 2023-02-06 NOTE — Assessment & Plan Note (Signed)
Ordered CMP to assess kidney function. Continue with [place] 10mg  daily.

## 2023-02-06 NOTE — Assessment & Plan Note (Signed)
Continue with Becky Boyle 10mg  daily and Metformin 500mg  BID. Ordered A1c. Will adjust medication if needed based on A1c.

## 2023-02-06 NOTE — Assessment & Plan Note (Signed)
Continue with Levothyroxine daily. Ordered TSH. Will change dose if needed depending on results.

## 2023-02-06 NOTE — Telephone Encounter (Signed)
-----   Message from Alveria Apley, NP sent at 02/06/2023  1:41 PM EDT ----- Kidney function is still low, recommend to hydrate and recommend a referral to a nephrologist (kidney doctor). Recommend to redraw kidney function in 2 weeks. (Lab: BMP; dx Chronic kidney disease, stage 3). A1c is has increased to 7.9 from 7.5, which is not at goal. Recommend to increase Metformin 500mg  tablet, take 2 tablets in the morning and 2 tablets in the evening. Decrease carbohydrates and sweets. TSH is normal. Recommend to continue taking Levothyroxine 25 mcg daily as you have been.

## 2023-02-07 ENCOUNTER — Other Ambulatory Visit: Payer: Self-pay

## 2023-02-07 DIAGNOSIS — N1832 Chronic kidney disease, stage 3b: Secondary | ICD-10-CM

## 2023-02-14 ENCOUNTER — Encounter (HOSPITAL_COMMUNITY): Payer: No Typology Code available for payment source

## 2023-02-20 ENCOUNTER — Other Ambulatory Visit (INDEPENDENT_AMBULATORY_CARE_PROVIDER_SITE_OTHER): Payer: No Typology Code available for payment source

## 2023-02-20 ENCOUNTER — Encounter (HOSPITAL_COMMUNITY): Payer: Self-pay

## 2023-02-20 ENCOUNTER — Telehealth: Payer: Self-pay

## 2023-02-20 ENCOUNTER — Ambulatory Visit (HOSPITAL_COMMUNITY)
Admission: RE | Admit: 2023-02-20 | Discharge: 2023-02-20 | Disposition: A | Discharge status: Home / Self Care | Payer: No Typology Code available for payment source | Source: Ambulatory Visit | Attending: Family Medicine | Admitting: Family Medicine

## 2023-02-20 VITALS — BP 138/72 | HR 64 | Wt 174.4 lb

## 2023-02-20 DIAGNOSIS — I739 Peripheral vascular disease, unspecified: Secondary | ICD-10-CM | POA: Diagnosis not present

## 2023-02-20 DIAGNOSIS — I2510 Atherosclerotic heart disease of native coronary artery without angina pectoris: Secondary | ICD-10-CM | POA: Insufficient documentation

## 2023-02-20 DIAGNOSIS — Z5986 Financial insecurity: Secondary | ICD-10-CM | POA: Diagnosis not present

## 2023-02-20 DIAGNOSIS — I252 Old myocardial infarction: Secondary | ICD-10-CM | POA: Insufficient documentation

## 2023-02-20 DIAGNOSIS — Z8249 Family history of ischemic heart disease and other diseases of the circulatory system: Secondary | ICD-10-CM | POA: Diagnosis not present

## 2023-02-20 DIAGNOSIS — I5042 Chronic combined systolic (congestive) and diastolic (congestive) heart failure: Secondary | ICD-10-CM

## 2023-02-20 DIAGNOSIS — Z79899 Other long term (current) drug therapy: Secondary | ICD-10-CM | POA: Insufficient documentation

## 2023-02-20 DIAGNOSIS — I255 Ischemic cardiomyopathy: Secondary | ICD-10-CM | POA: Insufficient documentation

## 2023-02-20 DIAGNOSIS — Z7902 Long term (current) use of antithrombotics/antiplatelets: Secondary | ICD-10-CM | POA: Insufficient documentation

## 2023-02-20 DIAGNOSIS — N1832 Chronic kidney disease, stage 3b: Secondary | ICD-10-CM | POA: Diagnosis not present

## 2023-02-20 DIAGNOSIS — Z955 Presence of coronary angioplasty implant and graft: Secondary | ICD-10-CM | POA: Diagnosis not present

## 2023-02-20 DIAGNOSIS — I5022 Chronic systolic (congestive) heart failure: Secondary | ICD-10-CM | POA: Insufficient documentation

## 2023-02-20 LAB — BASIC METABOLIC PANEL
BUN: 25 mg/dL — ABNORMAL HIGH (ref 6–23)
CO2: 26 mEq/L (ref 19–32)
Calcium: 10.1 mg/dL (ref 8.4–10.5)
Chloride: 104 mEq/L (ref 96–112)
Creatinine, Ser: 1.05 mg/dL (ref 0.40–1.20)
GFR: 53.46 mL/min — ABNORMAL LOW (ref >60.00)
Glucose, Bld: 144 mg/dL — ABNORMAL HIGH (ref 70–99)
Potassium: 4.6 mEq/L (ref 3.5–5.1)
Sodium: 140 mEq/L (ref 135–145)

## 2023-02-20 NOTE — Telephone Encounter (Signed)
I have sent the results via my chart pt did not answer and was unable to leave a VM

## 2023-02-20 NOTE — Patient Instructions (Addendum)
Thank you for coming in today  If you had labs drawn today, any labs that are abnormal the clinic will call you No news is good news  Medications: No changes  Follow up appointments:  Your physician recommends that you schedule a follow-up appointment in:  4 months With Dr. Earlean Shawl will receive a reminder letter in the mail a few months in advance. If you don't receive a letter, please call our office to schedule the follow-up appointment.    Do the following things EVERYDAY: Weigh yourself in the morning before breakfast. Write it down and keep it in a log. Take your medicines as prescribed Eat low salt foods--Limit salt (sodium) to 2000 mg per day.  Stay as active as you can everyday Limit all fluids for the day to less than 2 liters   At the [facility], you and your health needs are our priority. As part of our continuing mission to provide you with exceptional heart care, we have created designated Provider Care Teams. These Care Teams include your primary Cardiologist (physician) and Advanced Practice Providers (APPs- Physician Assistants and Nurse Practitioners) who all work together to provide you with the care you need, when you need it.   You may see any of the following providers on your designated Care Team at your next follow up: Dr Arvilla Meres Dr Marca Ancona Dr. Marcos Eke, NP Robbie Lis, [place] [facility] [place], [place] Brynda Peon, NP Karle Plumber, PharmD   Please be sure to bring in all your medications bottles to every appointment.    Thank you for choosing [facility]  If you have any questions or concerns before your next appointment please send [place] a message through [place] or call our office at [phone].    TO LEAVE A MESSAGE FOR THE NURSE SELECT OPTION 2, PLEASE LEAVE A MESSAGE INCLUDING: YOUR NAME DATE OF BIRTH CALL BACK NUMBER REASON FOR CALL**this  is important as we prioritize the call backs  YOU WILL RECEIVE A CALL BACK THE SAME DAY AS LONG AS YOU CALL BEFORE 4:00 PM

## 2023-02-20 NOTE — Progress Notes (Signed)
PCP: Alveria Apley, NP Cardiology: Dr. Shirlee Latch  72 y.o. with history of NSTEMI and ischemic cardiomyopathy presents for followup of CHF and CAD.  Patient was admitted in 7/21 with NSTEMI.  Cath showed LAD culprit, patient had DES x 2 to proximal LAD.  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 Echo was done showing EF 25-30%, septal akinesis. Patient was started on cardiac meds and was discharged home with a Lifevest. A new diagnosis of diabetes was made and she was started on metformin.   Echo in 10/21 showed EF up to 55-60% with normal RV. Echo in 2/23 showed EF 55-60%, apical hypokinesis, normal RV, mild AI, IVC normal.   Cardiac PET (3/24) arranged in setting of atypical chest pain. Low risk study with normal EF, small area of ischemia in RCA territory in setting of known total occlusion of RCA.  Today she returns for HF follow up. Overall feeling fine. She stays busy caring for her 71 month old great grand-daughter, as well as caring for her 48 year old neighbor. She does not have dyspnea with activity. She has occasional dizziness but no falls. Takes Lasix maybe 1-2x/month.  Denies palpitations, CP,  edema, or PND/Orthopnea. Appetite ok. No fever or chills. Weight at home 172 pounds. Taking all medications. Uses Lasix 1-2x/month.   Labs (7/21): K 4.1, creatinine 0.87 => 0.98 Labs (9/21): BNP 59, K 4.1, creatinine 1.2, LDL 37, TGs 197, HDL 27 Labs (10/22): K 5.3, creatinine 1.03, LDL 21, TGs 148 Labs (4/23): K 4.4, creatinine 1.1, LDL 33, TGs 177 Labs (10/23): LDL 35, TGs 140, K 4.8, creatinine 0.87 Labs (4/24): LDL 40, TGs 326 Labs (7/24): K 4.7, creatinine 1.18  ECG (personally reviewed): none ordered today.  PMH: 1. Type 2 diabetes: New diagnosis in 7/21.  2. Hyperlipidemia 3. Chronic systolic CHF: Ischemic cardiomyopathy.   - Echo (7/21): EF 25-30%, septal akinesis.   - Echo (10/21): EF 55-60%, normal RV, mild MR.  - Echo (2/23): EF  55-60%, apical hypokinesis, normal RV, mild AI, IVC normal.  4. CAD: NSTEMI in 7/21.  - LHC: DES x 2 to proximal LAD, CTO RCA with collaterals, moderate PLOM stenosis. - Cardiac PET (3/24): Low risk study, normal EF, small area of ischemia in RCA territory in setting of known total RCA occlusion.  5. Syncope: x 2 in 10/22.  - Zio monitor (1/23): No worrisome arrhythmias.   Social History   Socioeconomic History   Marital status: Married    Spouse name: Not on file   Number of children: 4   Years of education: Not on file   Highest education level: Associate degree: occupational, Scientist, product/process development, or vocational program  Occupational History   Occupation: retired  Tobacco Use   Smoking status: Never   Smokeless tobacco: Never  Vaping Use   Vaping status: Never Used  Substance and Sexual Activity   Alcohol use: No   Drug use: No   Sexual activity: Not Currently  Other Topics Concern   Not on file  Social History Narrative   Patient lives with spouse. 2 dogs.    Social Determinants of Health   Financial Resource Strain: Medium Risk (12/03/2022)   Overall Financial Resource Strain (CARDIA)    Difficulty of Paying Living Expenses: Somewhat hard  Food Insecurity: Food Insecurity Present (12/03/2022)   Hunger Vital Sign    Worried About Running Out of Food in the Last Year: Sometimes true    Ran Out of Food  in the Last Year: Never true  Transportation Needs: No Transportation Needs (12/03/2022)   PRAPARE - Administrator, Civil Service (Medical): No    Lack of Transportation (Non-Medical): No  Physical Activity: Sufficiently Active (12/03/2022)   Exercise Vital Sign    Days of Exercise per Week: 7 days    Minutes of Exercise per Session: 30 min  Stress: Stress Concern Present (12/03/2022)   [employer] of Occupational Health - Occupational Stress Questionnaire    Feeling of Stress : To some extent  Social Connections: Socially Integrated (12/03/2022)   Social Connection and  Isolation Panel [NHANES]    Frequency of Communication with Friends and Family: More than three times a week    Frequency of Social Gatherings with Friends and Family: Twice a week    Attends Religious Services: More than 4 times per year    Active Member of [employer] or Organizations: No    Attends Engineer, structural: 1 to 4 times per year    Marital Status: Married  Catering manager Violence: Not At Risk (07/18/2022)   Humiliation, Afraid, Rape, and Kick questionnaire    Fear of Current or Ex-Partner: No    Emotionally Abused: No    Physically Abused: No    Sexually Abused: No   Family History  Problem Relation Age of Onset   Alzheimer's disease Mother    Cancer Father        Prostate to bone   Diverticulitis Sister    Diabetes Son    Cancer Son    Heart disease Son    High blood pressure Son    Heart attack Son    Hypertension Son    ROS: All systems reviewed and negative except as noted in HPI.   Current Outpatient Medications  Medication Sig Dispense Refill   atorvastatin (LIPITOR) 40 MG tablet Take 1 tablet (40 mg total) by mouth daily. 90 tablet 0   calcium-vitamin D (OSCAL WITH D) 500-5 MG-MCG tablet Take 1 tablet by mouth daily with breakfast.     carvedilol (COREG) 12.5 MG tablet TAKE 1 TABLET 2 TIMES A DAY WITH A MEAL 180 tablet 3   cetirizine (ZYRTEC) 10 MG tablet Take 10 mg by mouth as needed for allergies.     clopidogrel (PLAVIX) 75 MG tablet TAKE ONE TABLET ONCE DAILY 30 tablet 6   Cyanocobalamin (VITAMIN B 12 PO) Take 1 tablet by mouth daily.     dapagliflozin propanediol (FARXIGA) 10 MG TABS tablet Take 1 tablet (10 mg total) by mouth daily before breakfast. 90 tablet 3   furosemide (LASIX) 20 MG tablet Take 1 tablet (20 mg total) by mouth daily as needed. (NEEDS TO BE SEEN BEFORE NEXT REFILL) 30 tablet 5   Lancets (ONETOUCH DELICA PLUS LANCET33G) MISC Apply 1 each topically daily. 1 each 2   levothyroxine (SYNTHROID) 25 MCG tablet TAKE ONE TABLET  DAILY BEFORE BREAKFAST 90 tablet 1   metformin (FORTAMET) 500 MG (OSM) 24 hr tablet Take 2,000 mg by mouth daily with breakfast. Take 2 tablets by month in the morning and 2 tablet by mouth in the evening.     nitroGLYCERIN (NITROSTAT) 0.4 MG SL tablet Place 1 tablet (0.4 mg total) under the tongue every 5 (five) minutes x 3 doses as needed for chest pain. 25 tablet 2   ONETOUCH VERIO test strip CHECK BLOOD SUGAR ONCE DAILY OR AS DIRECTED 50 strip 0   rOPINIRole (REQUIP) 1 MG tablet Take 1 tablet (  1 mg total) by mouth daily. 90 tablet 0   sacubitril-valsartan (ENTRESTO) 97-103 MG Take 1 tablet by mouth 2 (two) times daily. 180 tablet 3   sodium chloride (OCEAN) 0.65 % SOLN nasal spray Place 1 spray into both nostrils as needed for congestion. 30 mL 3   spironolactone (ALDACTONE) 25 MG tablet Take 1 tablet (25 mg total) by mouth daily. 90 tablet 1   No current facility-administered medications for this encounter.   Wt Readings from Last 3 Encounters:  02/20/23 79.1 kg (174 lb 6.4 oz)  02/05/23 78.9 kg (174 lb)  11/02/22 78.6 kg (173 lb 4 oz)   BP 138/72   Pulse 64   Wt 79.1 kg (174 lb 6.4 oz)   SpO2 99%   BMI 29.94 kg/m  Physical Exam General:  NAD. No resp difficulty, walked into clinic HEENT: Normal Neck: Supple. No JVD. Carotids 2+ bilat; no bruits. No lymphadenopathy or thryomegaly appreciated. Cor: PMI nondisplaced. Regular rate & rhythm. No rubs, gallops or murmurs. Lungs: Clear Abdomen: Soft, nontender, nondistended. No hepatosplenomegaly. No bruits or masses. Good bowel sounds. Extremities: No cyanosis, clubbing, rash, edema Neuro: Alert & oriented x 3, cranial nerves grossly intact. Moves all 4 extremities w/o difficulty. Affect pleasant.  Assessment/Plan: 1. CAD: NSTEMI in 7/21 with DES x 2 to proximal LAD.  There was a chronic total occlusion of the RCA and moderate stenosis in the PLOM.  Cardiac PET completed 3/24, in setting of atypical chest pain, which showed normal EF,  evidence for small area of ischemia in RCA territory but RCA known to be totally occluded. No further chest pain. - Continue Plavix 75 mg daily.    - Continue atorvastatin 80 mg daily, good LDL in 4/24. TGs elevated, blood sugars had been elevated. Will need to watch, can consider addition of Vascepa down the road. 2. Chronic systolic CHF: Ischemic cardiomyopathy.  Echo in 7/21 with EF 25-30%, akinetic septum.  Echo in 10/21 with EF up to 55-60%.  Echo in 2/23 showed EF 55-60%, apical hypokinesis, normal RV, mild AI, IVC normal.  Normal EF on cardiac PET (3/24). She is not volume overloaded on exam, NYHA class I.  - Continue Entresto 97/103 bid. Recent labs reviewed and are stable, K 4.7, SCr 1.18 - Continue Coreg 12.5 mg bid.  - Continue spironolactone 25 mg daily.  - Continue Farxiga 10 mg daily. No GU symptoms. 3. PAD: ABIs (2/24) normal, but TBIs abnormal suggesting some PAD distally. No resting leg pain or pedal ulcers. Previous leg pain resolved with addition of ropinirole. - Continue statin.  Follow up in 4 months with Dr. Shirlee Latch.  [facility] FNP-BC 02/20/2023

## 2023-02-20 NOTE — Telephone Encounter (Signed)
-----   Message from Zandra Abts sent at 02/20/2023  4:17 PM EDT -----  Your kidney function has improved, but still not at goal. Continue to hydrate with fluids. Have you heard about a nephrology appointment?

## 2023-02-20 NOTE — Telephone Encounter (Signed)
Called pt and gave her the number to - [facility] [address] [place], [place] [address] [phone]   She is going to call and make this appointment.

## 2023-02-22 ENCOUNTER — Telehealth: Payer: Self-pay | Admitting: Family Medicine

## 2023-02-22 NOTE — Telephone Encounter (Signed)
Sent to Julianne Handler , NP

## 2023-02-22 NOTE — Telephone Encounter (Signed)
Patient states she spoke with [facility] and they advised her that on a scale of 1-4, she is a 4 (which is their least important). They said they will call the pt with an appointment as soon a they can, just not as urgent since they feel she is okay. Patient just wanted to inform us.

## 2023-03-13 ENCOUNTER — Telehealth: Payer: Self-pay | Admitting: Family Medicine

## 2023-03-13 NOTE — Telephone Encounter (Signed)
Encourage patient to contact the pharmacy for refills or they can request refills through [facility]  WHAT PHARMACY WOULD THEY LIKE THIS SENT TO:  [facility] [place], [place] - [address]    MEDICATION NAME & DOSE: metformin (FORTAMET) 500 MG (OSM) 24 hr tablet   NOTES/COMMENTS FROM PATIENT: Since increased from 2-4 tablets daily she'll run out and needs a refill ASAP.     Front office please notify patient: It takes 48-72 hours to process rx refill requests Ask patient to call pharmacy to ensure rx is ready before heading there.

## 2023-03-13 NOTE — Telephone Encounter (Signed)
It does appear that you increased her Metformin 500 mg to one tablet BID is ok to send in new Rx ?

## 2023-03-14 ENCOUNTER — Other Ambulatory Visit: Payer: Self-pay

## 2023-03-14 DIAGNOSIS — E1165 Type 2 diabetes mellitus with hyperglycemia: Secondary | ICD-10-CM

## 2023-03-14 MED ORDER — METFORMIN HCL 500 MG PO TABS
1000.0000 mg | ORAL_TABLET | Freq: Two times a day (BID) | ORAL | 0 refills | Status: DC
Start: 2023-03-14 — End: 2023-06-13

## 2023-03-14 MED ORDER — METFORMIN HCL 500 MG PO TABS
500.0000 mg | ORAL_TABLET | Freq: Two times a day (BID) | ORAL | 0 refills | Status: DC
Start: 2023-03-14 — End: 2023-06-13

## 2023-03-14 NOTE — Telephone Encounter (Signed)
Spoke with pt and she reports she has been taking 2 times in am and 2 in pm Refill was sent in today.

## 2023-04-09 ENCOUNTER — Other Ambulatory Visit: Payer: Self-pay | Admitting: Family Medicine

## 2023-04-09 DIAGNOSIS — I5042 Chronic combined systolic (congestive) and diastolic (congestive) heart failure: Secondary | ICD-10-CM

## 2023-04-09 DIAGNOSIS — G2581 Restless legs syndrome: Secondary | ICD-10-CM

## 2023-04-09 NOTE — Telephone Encounter (Signed)
It appears that the pt's last PCP gave her this medication . Would you like to refill ?

## 2023-05-04 ENCOUNTER — Other Ambulatory Visit: Payer: Self-pay

## 2023-05-04 ENCOUNTER — Telehealth: Payer: Self-pay | Admitting: Family Medicine

## 2023-05-04 DIAGNOSIS — E1169 Type 2 diabetes mellitus with other specified complication: Secondary | ICD-10-CM

## 2023-05-04 MED ORDER — ATORVASTATIN CALCIUM 40 MG PO TABS
40.0000 mg | ORAL_TABLET | Freq: Every day | ORAL | 1 refills | Status: DC
Start: 2023-05-04 — End: 2023-10-25

## 2023-05-04 NOTE — Telephone Encounter (Signed)
Refill sent in for 90 days with 1 refill Upcoming appt 05/14/2023

## 2023-05-04 NOTE — Telephone Encounter (Signed)
Encourage patient to contact the pharmacy for refills or they can request refills through [facility]    WHAT PHARMACY WOULD THEY LIKE THIS SENT TO:  [facility] [place], [place] - [address]   MEDICATION NAME & DOSE: atorvastatin (LIPITOR) 40 MG tablet   NOTES/COMMENTS FROM PATIENT:      Front office please notify patient: It takes 48-72 hours to process rx refill requests Ask patient to call pharmacy to ensure rx is ready before heading there.

## 2023-05-07 ENCOUNTER — Other Ambulatory Visit: Payer: Self-pay | Admitting: Family Medicine

## 2023-05-07 DIAGNOSIS — E039 Hypothyroidism, unspecified: Secondary | ICD-10-CM

## 2023-05-09 ENCOUNTER — Other Ambulatory Visit (HOSPITAL_COMMUNITY): Payer: Self-pay | Admitting: Cardiology

## 2023-05-09 ENCOUNTER — Telehealth: Payer: Self-pay | Admitting: Family Medicine

## 2023-05-09 NOTE — Telephone Encounter (Signed)
[facility] [place] [facility] Night - C Client Site [place] [facility] [place] [facility] - Night Provider AA - PHYSICIAN, NOT LISTED- MD Contact Type Call Who Is Calling Patient / Member / Family / Caregiver Caller Name Barbar Brede Caller Phone Number [phone] Patient Name Becky Boyle Patient DOB August 16, 1950 Call Type Message Only Information Provided Reason for Call Request to [facility] Appointment Initial Comment Caller states she will not be able to keep the appointment on Oct. 14, 2024 due to transportation issues. Patient request to speak to RN No Disp. Time Disposition Final User 05/08/2023 1:35:19 PM General Information Provided Yes Debroah Loop Call Closed By: Debroah Loop Transaction Date/Time: 05/08/2023 1:33:11 PM (ET)

## 2023-05-10 NOTE — Telephone Encounter (Signed)
Will do ASAP.

## 2023-05-14 ENCOUNTER — Ambulatory Visit: Payer: No Typology Code available for payment source | Admitting: Family Medicine

## 2023-05-23 NOTE — Telephone Encounter (Signed)
Pt has visit on 11/13.

## 2023-06-12 NOTE — Progress Notes (Unsigned)
   Established Patient Office Visit   Subjective:  Patient ID: Becky Boyle, female    DOB: 09-24-1950  Age: 72 y.o. MRN: [id number]  No chief complaint on file.   HPI Restless Leg Syndrome: Chronic. Patient is taking Ropinirole 1mg  at night with relief.   Diabetes: Chronic. Patient is taking [place] 10mg  daily and Metformin 1000mg  BID. On last visit increased Metformin based on elevated A1c.    Lab Results  Component Value Date   HGBA1C 7.9 (H) 02/05/2023   Hypothyroidism: Chronic. Patient is taking Levothyroxine daily.  Denies any symptoms  Lab Results  Component Value Date   TSH 3.82 02/05/2023    Chronic Kidney Disease: Patient is taking [place] with dual therapy in protecting kidneys, chronic systolic CHF, and diabetes. On previous visit, patient was referred to nephrologist, [facility].    Hyperlipidemia: Chronic. Patient is taking Atorvastatin 40mg  daily. Patient denies any symptoms of muscle aches. Followed by cardiology. Last seen on 02/20/2023 with Prince Rome, NP and recommended a 4 month follow up with Dr. Shirlee Latch. Lipids have not been monitored since Feb.  Lab Results  Component Value Date   CHOL 116 09/27/2022   HDL 27 (L) 09/27/2022   LDLCALC 40 09/27/2022   TRIG 326 (H) 09/27/2022   CHOLHDL 4.3 09/27/2022    ROS See HPI above     Objective:     There were no vitals taken for this visit. {Vitals History (Optional):23777}  Physical Exam  No results found for any visits on 06/13/23.  The ASCVD Risk score (Arnett DK, et al., 2019) failed to calculate for the following reasons:   The patient has a prior MI or stroke diagnosis    Assessment & Plan:  There are no diagnoses linked to this encounter.  No follow-ups on file.   Zandra Abts, NP

## 2023-06-13 ENCOUNTER — Encounter: Payer: Self-pay | Admitting: Family Medicine

## 2023-06-13 ENCOUNTER — Ambulatory Visit (INDEPENDENT_AMBULATORY_CARE_PROVIDER_SITE_OTHER): Payer: No Typology Code available for payment source | Admitting: Family Medicine

## 2023-06-13 ENCOUNTER — Other Ambulatory Visit (HOSPITAL_COMMUNITY): Payer: Self-pay | Admitting: Cardiology

## 2023-06-13 VITALS — BP 144/82 | HR 68 | Temp 97.6°F | Ht 63.25 in | Wt 172.2 lb

## 2023-06-13 DIAGNOSIS — Z7984 Long term (current) use of oral hypoglycemic drugs: Secondary | ICD-10-CM | POA: Diagnosis not present

## 2023-06-13 DIAGNOSIS — E1122 Type 2 diabetes mellitus with diabetic chronic kidney disease: Secondary | ICD-10-CM | POA: Diagnosis not present

## 2023-06-13 DIAGNOSIS — E1165 Type 2 diabetes mellitus with hyperglycemia: Secondary | ICD-10-CM | POA: Diagnosis not present

## 2023-06-13 DIAGNOSIS — M79604 Pain in right leg: Secondary | ICD-10-CM | POA: Diagnosis not present

## 2023-06-13 DIAGNOSIS — G2581 Restless legs syndrome: Secondary | ICD-10-CM | POA: Diagnosis not present

## 2023-06-13 DIAGNOSIS — E785 Hyperlipidemia, unspecified: Secondary | ICD-10-CM

## 2023-06-13 DIAGNOSIS — E039 Hypothyroidism, unspecified: Secondary | ICD-10-CM

## 2023-06-13 DIAGNOSIS — E1169 Type 2 diabetes mellitus with other specified complication: Secondary | ICD-10-CM | POA: Diagnosis not present

## 2023-06-13 DIAGNOSIS — N183 Chronic kidney disease, stage 3 unspecified: Secondary | ICD-10-CM

## 2023-06-13 MED ORDER — METFORMIN HCL 500 MG PO TABS: 1000.0000 mg | ORAL_TABLET | Freq: Two times a day (BID) | ORAL | 0 refills | Status: DC

## 2023-06-13 MED ORDER — METHOCARBAMOL 500 MG PO TABS
500.0000 mg | ORAL_TABLET | Freq: Three times a day (TID) | ORAL | 0 refills | Status: DC | PRN
Start: 2023-06-13 — End: 2023-06-15

## 2023-06-13 NOTE — Assessment & Plan Note (Signed)
Continue with Becky Boyle 10mg  daily. Ordered CMP. Patient has an appointment with [facility] next week.

## 2023-06-13 NOTE — Assessment & Plan Note (Signed)
Stable. Continue Atorvastatin 40mg  daily. Ordered CMP and lipid panel. Patient is fasting. Encouraged patient to make a follow up appointment with cardiology, Dr. Shirlee Latch.

## 2023-06-13 NOTE — Assessment & Plan Note (Signed)
Stable. Continue with Ropinirole 1mg  at bedtime.

## 2023-06-13 NOTE — Assessment & Plan Note (Signed)
Stable. Continue with Levothyroxine daily. Ordered  TSH.

## 2023-06-13 NOTE — Patient Instructions (Addendum)
-  START Robaxin 500mg  tablet, take 1 tablet every 8 hours as needed for muscle spasms. Caution: This medication can cause downiness. -You may continue to take Tylenol 1,000mg  every 8 hours as needed for pain. Also, can use heat or cold compresses to the area, 4-6 times a day, for 20 minutes.  -Continue all medications as prescribed. -Please make an appointment with cardiology, Dr. Shirlee Latch.  -You may get your influenza vaccine and covid booster at your local pharmacy at the same time as you prefer. -Ordered labs. Office will call with lab results and you will see them on MyChart. -Follow up in 6 months for chronic management or sooner if right leg pain does not improve. -I hope your family and you have a wonderful holiday season.

## 2023-06-13 NOTE — Assessment & Plan Note (Signed)
Stable. Continue with [place] 10mg  daily and Metformin 1000mg  BID. Ordered CMP and A1c. Refilled Metformin.

## 2023-06-15 ENCOUNTER — Other Ambulatory Visit: Payer: Self-pay | Admitting: Family

## 2023-06-15 ENCOUNTER — Telehealth: Payer: Self-pay | Admitting: Family Medicine

## 2023-06-15 DIAGNOSIS — M79604 Pain in right leg: Secondary | ICD-10-CM

## 2023-06-15 MED ORDER — CYCLOBENZAPRINE HCL 5 MG PO TABS: 5.0000 mg | ORAL_TABLET | Freq: Three times a day (TID) | ORAL | 1 refills | Status: DC | PRN

## 2023-06-15 MED ORDER — METHOCARBAMOL 500 MG PO TABS: 500.0000 mg | ORAL_TABLET | Freq: Three times a day (TID) | ORAL | 0 refills | Status: DC | PRN

## 2023-06-15 NOTE — Telephone Encounter (Signed)
Respectfully, did you send the same medication? Please see previous message. Pt states it is not working and wanted to try something else.

## 2023-06-15 NOTE — Telephone Encounter (Signed)
I, unfortunately, do not have access to be able to cancel any pharmacy orders.

## 2023-06-15 NOTE — Telephone Encounter (Signed)
Pt called to say this Rx:  methocarbamol (ROBAXIN) 500 MG tablets, does not seem to be working at all.  Pt would like to try something else.  Please advise.   [facility] [place], [place] - [address] Phone: [phone]  Fax: [phone]

## 2023-06-18 NOTE — Telephone Encounter (Signed)
Contacted pharmacy and spoke to [employer], pharmacist. He reports he had picked up the new Rx cyclobenzaprine.

## 2023-06-20 ENCOUNTER — Other Ambulatory Visit (INDEPENDENT_AMBULATORY_CARE_PROVIDER_SITE_OTHER): Payer: No Typology Code available for payment source

## 2023-06-20 DIAGNOSIS — N1832 Chronic kidney disease, stage 3b: Secondary | ICD-10-CM

## 2023-06-20 LAB — BASIC METABOLIC PANEL
BUN: 32 mg/dL — ABNORMAL HIGH (ref 6–23)
CO2: 25 meq/L (ref 19–32)
Calcium: 9.7 mg/dL (ref 8.4–10.5)
Chloride: 103 meq/L (ref 96–112)
Creatinine, Ser: 1.02 mg/dL (ref 0.40–1.20)
GFR: 55.23 mL/min — ABNORMAL LOW (ref >60.00)
Glucose, Bld: 125 mg/dL — ABNORMAL HIGH (ref 70–99)
Potassium: 4.5 meq/L (ref 3.5–5.1)
Sodium: 137 meq/L (ref 135–145)

## 2023-06-21 DIAGNOSIS — N182 Chronic kidney disease, stage 2 (mild): Secondary | ICD-10-CM | POA: Diagnosis not present

## 2023-06-21 DIAGNOSIS — E1122 Type 2 diabetes mellitus with diabetic chronic kidney disease: Secondary | ICD-10-CM | POA: Diagnosis not present

## 2023-06-21 DIAGNOSIS — E785 Hyperlipidemia, unspecified: Secondary | ICD-10-CM | POA: Diagnosis not present

## 2023-06-21 DIAGNOSIS — I509 Heart failure, unspecified: Secondary | ICD-10-CM | POA: Diagnosis not present

## 2023-06-21 DIAGNOSIS — E039 Hypothyroidism, unspecified: Secondary | ICD-10-CM | POA: Diagnosis not present

## 2023-06-22 LAB — LAB REPORT - SCANNED
Albumin, Urine POC: 86.1
Creatinine, POC: 92 mg/dL
EGFR: 63
Microalb Creat Ratio: 94

## 2023-07-05 ENCOUNTER — Other Ambulatory Visit: Payer: Self-pay | Admitting: Family Medicine

## 2023-07-05 ENCOUNTER — Other Ambulatory Visit (HOSPITAL_COMMUNITY): Payer: Self-pay | Admitting: Cardiology

## 2023-07-05 DIAGNOSIS — G2581 Restless legs syndrome: Secondary | ICD-10-CM

## 2023-07-05 NOTE — Telephone Encounter (Signed)
Please call to schedule an appointment for further refills.

## 2023-07-09 ENCOUNTER — Telehealth (HOSPITAL_COMMUNITY): Payer: Self-pay | Admitting: Pharmacy Technician

## 2023-07-09 ENCOUNTER — Other Ambulatory Visit (HOSPITAL_COMMUNITY): Payer: Self-pay

## 2023-07-09 NOTE — Telephone Encounter (Signed)
Advanced Heart Failure Patient Advocate Encounter  The patient was approved for a [employer] grant that will help cover the cost of Farxiga, Entresto, Spironolactone and Coreg. Total amount awarded, $10,000. Eligibility, 05/13/22 - 05/12/24.  ID [id number]  BIN [id number]  PCN [id number]  Group [id number]  Spoke with the patient. Called and provided information to pharmacy.  Archer Asa, CPhT

## 2023-07-12 ENCOUNTER — Other Ambulatory Visit: Payer: Self-pay | Admitting: Family

## 2023-07-17 NOTE — Telephone Encounter (Signed)
Pt take med 3 times a day

## 2023-07-26 NOTE — Telephone Encounter (Signed)
error 

## 2023-08-22 ENCOUNTER — Other Ambulatory Visit: Payer: Self-pay | Admitting: *Deleted

## 2023-08-22 DIAGNOSIS — I5042 Chronic combined systolic (congestive) and diastolic (congestive) heart failure: Secondary | ICD-10-CM

## 2023-08-22 DIAGNOSIS — E1165 Type 2 diabetes mellitus with hyperglycemia: Secondary | ICD-10-CM

## 2023-08-22 MED ORDER — METFORMIN HCL 500 MG PO TABS: 1000.0000 mg | ORAL_TABLET | Freq: Two times a day (BID) | ORAL | 0 refills | Status: DC

## 2023-08-22 MED ORDER — SPIRONOLACTONE 25 MG PO TABS: 25.0000 mg | ORAL_TABLET | Freq: Every day | ORAL | 0 refills | Status: DC

## 2023-08-31 ENCOUNTER — Encounter (HOSPITAL_COMMUNITY): Payer: No Typology Code available for payment source | Admitting: Cardiology

## 2023-09-26 ENCOUNTER — Other Ambulatory Visit (HOSPITAL_COMMUNITY): Payer: Self-pay | Admitting: Cardiology

## 2023-09-26 ENCOUNTER — Other Ambulatory Visit: Payer: Self-pay | Admitting: Family Medicine

## 2023-09-26 DIAGNOSIS — E1165 Type 2 diabetes mellitus with hyperglycemia: Secondary | ICD-10-CM

## 2023-09-26 DIAGNOSIS — E039 Hypothyroidism, unspecified: Secondary | ICD-10-CM

## 2023-10-02 ENCOUNTER — Other Ambulatory Visit: Payer: Self-pay | Admitting: Family Medicine

## 2023-10-02 DIAGNOSIS — G2581 Restless legs syndrome: Secondary | ICD-10-CM

## 2023-10-02 NOTE — Telephone Encounter (Signed)
 Last Fill: 07/05/23  Last OV: 06/13/23 Next OV: 12/11/23  Routing to provider for review/authorization.

## 2023-10-02 NOTE — Telephone Encounter (Unsigned)
 Copied from CRM [id number]. Topic: Clinical - Medication Refill >> Oct 02, 2023  3:26 PM Ernst Spell wrote: Most Recent Primary Care Visit:  Provider: [facility]  Department: [facility]  Visit Type: LAB  Date: 06/20/2023  Medication: ropinrole  Has the patient contacted their pharmacy? Yes They claimed they reached out to the office.  Is this the correct pharmacy for this prescription? Yes If no, delete pharmacy and type the correct one.  This is the patient's preferred pharmacy:  [id number] [place] [place] - [address] [place] [address] Phone: [phone] Fax: [phone]   Has the prescription been filled recently? No  Is the patient out of the medication? Yes  Has the patient been seen for an appointment in the last year OR does the patient have an upcoming appointment? No  Can we respond through MyChart? No  Agent: Please be advised that Rx refills may take up to 3 business days. We ask that you follow-up with your pharmacy.

## 2023-10-04 MED ORDER — ROPINIROLE HCL 1 MG PO TABS: 1.0000 mg | ORAL_TABLET | Freq: Every day | ORAL | 0 refills | Status: DC

## 2023-10-24 ENCOUNTER — Telehealth: Payer: Self-pay

## 2023-10-24 ENCOUNTER — Ambulatory Visit (HOSPITAL_COMMUNITY)
Admission: RE | Admit: 2023-10-24 | Discharge: 2023-10-24 | Disposition: A | Discharge status: Home / Self Care | Payer: No Typology Code available for payment source | Source: Ambulatory Visit | Attending: Cardiology | Admitting: Cardiology

## 2023-10-24 VITALS — BP 144/80 | HR 80 | Wt 167.6 lb

## 2023-10-24 DIAGNOSIS — Z79899 Other long term (current) drug therapy: Secondary | ICD-10-CM | POA: Diagnosis not present

## 2023-10-24 DIAGNOSIS — I2582 Chronic total occlusion of coronary artery: Secondary | ICD-10-CM | POA: Insufficient documentation

## 2023-10-24 DIAGNOSIS — Z7984 Long term (current) use of oral hypoglycemic drugs: Secondary | ICD-10-CM | POA: Diagnosis not present

## 2023-10-24 DIAGNOSIS — E119 Type 2 diabetes mellitus without complications: Secondary | ICD-10-CM | POA: Insufficient documentation

## 2023-10-24 DIAGNOSIS — I2510 Atherosclerotic heart disease of native coronary artery without angina pectoris: Secondary | ICD-10-CM | POA: Insufficient documentation

## 2023-10-24 DIAGNOSIS — Z955 Presence of coronary angioplasty implant and graft: Secondary | ICD-10-CM | POA: Diagnosis not present

## 2023-10-24 DIAGNOSIS — I5042 Chronic combined systolic (congestive) and diastolic (congestive) heart failure: Secondary | ICD-10-CM | POA: Diagnosis not present

## 2023-10-24 DIAGNOSIS — R0600 Dyspnea, unspecified: Secondary | ICD-10-CM | POA: Insufficient documentation

## 2023-10-24 DIAGNOSIS — K219 Gastro-esophageal reflux disease without esophagitis: Secondary | ICD-10-CM | POA: Insufficient documentation

## 2023-10-24 DIAGNOSIS — I252 Old myocardial infarction: Secondary | ICD-10-CM | POA: Diagnosis not present

## 2023-10-24 DIAGNOSIS — I110 Hypertensive heart disease with heart failure: Secondary | ICD-10-CM | POA: Diagnosis not present

## 2023-10-24 DIAGNOSIS — Z7902 Long term (current) use of antithrombotics/antiplatelets: Secondary | ICD-10-CM | POA: Insufficient documentation

## 2023-10-24 DIAGNOSIS — I255 Ischemic cardiomyopathy: Secondary | ICD-10-CM | POA: Insufficient documentation

## 2023-10-24 LAB — BASIC METABOLIC PANEL
Anion gap: 10 (ref 5–15)
BUN: 19 mg/dL (ref 8–23)
CO2: 24 mmol/L (ref 22–32)
Calcium: 9.6 mg/dL (ref 8.9–10.3)
Chloride: 107 mmol/L (ref 98–111)
Creatinine, Ser: 0.94 mg/dL (ref 0.44–1.00)
GFR, Estimated: >60 mL/min (ref >60)
Glucose, Bld: 148 mg/dL — ABNORMAL HIGH (ref 70–99)
Potassium: 4.4 mmol/L (ref 3.5–5.1)
Sodium: 141 mmol/L (ref 135–145)

## 2023-10-24 LAB — LIPID PANEL
Cholesterol: 115 mg/dL (ref 0–200)
HDL: 29 mg/dL — ABNORMAL LOW (ref >40)
LDL Cholesterol: 52 mg/dL (ref 0–99)
Total CHOL/HDL Ratio: 4 ratio
Triglycerides: 168 mg/dL — ABNORMAL HIGH (ref <150)
VLDL: 34 mg/dL (ref 0–40)

## 2023-10-24 MED ORDER — PANTOPRAZOLE SODIUM 40 MG PO TBEC: 40.0000 mg | DELAYED_RELEASE_TABLET | Freq: Every day | ORAL | 11 refills | Status: AC

## 2023-10-24 MED ORDER — CARVEDILOL 25 MG PO TABS: 25.0000 mg | ORAL_TABLET | Freq: Two times a day (BID) | ORAL | 3 refills | Status: AC

## 2023-10-24 NOTE — Progress Notes (Signed)
 PCP: Alveria Apley, NP Cardiology: Dr. Shirlee Latch  Chief complaint: CAD/CHF  73 y.o. with history of NSTEMI and ischemic cardiomyopathy presents for followup of CHF and CAD.  Patient was admitted in 7/21 with NSTEMI.  Cath showed LAD culprit, patient had DES x 2 to proximal LAD.  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 Echo was done showing EF 25-30%, septal akinesis. Patient was started on cardiac meds and was discharged home with a Lifevest. A new diagnosis of diabetes was made and she was started on metformin.   Echo in 10/21 showed EF up to 55-60% with normal RV. Echo in 2/23 showed EF 55-60%, apical hypokinesis, normal RV, mild AI, IVC normal.   Cardiac PET (3/24) arranged in setting of atypical chest pain. Low risk study with normal EF, small area of ischemia in RCA territory in setting of known total occlusion of RCA.  Today she returns for HF follow up.  She keeps her great-granddaughter 6 days/week.  She stays very active. Mild dyspnea walking up a flight of stairs, otherwise no significant shortness of breath. No anginal-type chest pain, but she does note GERD symptoms after eating and has been taking omeprazole.  No orthopnea/PND. She uses Lasix rarely. Weight down 7 lbs. SBP in 140s when she checks at home.   Labs (7/21): K 4.1, creatinine 0.87 => 0.98 Labs (9/21): BNP 59, K 4.1, creatinine 1.2, LDL 37, TGs 197, HDL 27 Labs (10/22): K 5.3, creatinine 1.03, LDL 21, TGs 148 Labs (4/23): K 4.4, creatinine 1.1, LDL 33, TGs 177 Labs (10/23): LDL 35, TGs 140, K 4.8, creatinine 0.87 Labs (4/24): LDL 40, TGs 326 Labs (7/24): K 4.7, creatinine 1.18  ECG (personally reviewed): NSR, normal  PMH: 1. Type 2 diabetes: New diagnosis in 7/21.  2. Hyperlipidemia 3. Chronic systolic CHF: Ischemic cardiomyopathy.   - Echo (7/21): EF 25-30%, septal akinesis.   - Echo (10/21): EF 55-60%, normal RV, mild MR.  - Echo (2/23): EF 55-60%, apical  hypokinesis, normal RV, mild AI, IVC normal.  4. CAD: NSTEMI in 7/21.  - LHC: DES x 2 to proximal LAD, CTO RCA with collaterals, moderate PLOM stenosis. - Cardiac PET (3/24): Low risk study, normal EF, small area of ischemia in RCA territory in setting of known total RCA occlusion.  5. Syncope: x 2 in 10/22.  - Zio monitor (1/23): No worrisome arrhythmias.   Social History   Socioeconomic History   Marital status: Married    Spouse name: Not on file   Number of children: 4   Years of education: Not on file   Highest education level: Associate degree: occupational, Scientist, product/process development, or vocational program  Occupational History   Occupation: retired  Tobacco Use   Smoking status: Never   Smokeless tobacco: Never  Vaping Use   Vaping status: Never Used  Substance and Sexual Activity   Alcohol use: No   Drug use: No   Sexual activity: Not Currently  Other Topics Concern   Not on file  Social History Narrative   Patient lives with spouse. 2 dogs.    Social Drivers of Health   Financial Resource Strain: Medium Risk (06/09/2023)   Overall Financial Resource Strain (CARDIA)    Difficulty of Paying Living Expenses: Somewhat hard  Food Insecurity: Food Insecurity Present (06/09/2023)   Hunger Vital Sign    Worried About Running Out of Food in the Last Year: Sometimes true    Ran Out of Food in the  Last Year: Sometimes true  Transportation Needs: No Transportation Needs (06/09/2023)   PRAPARE - Administrator, Civil Service (Medical): No    Lack of Transportation (Non-Medical): No  Physical Activity: Sufficiently Active (06/09/2023)   Exercise Vital Sign    Days of Exercise per Week: 7 days    Minutes of Exercise per Session: 30 min  Stress: No Stress Concern Present (06/09/2023)   [employer] of Occupational Health - Occupational Stress Questionnaire    Feeling of Stress : Only a little  Social Connections: Moderately Integrated (06/09/2023)   Social Connection and  Isolation Panel [NHANES]    Frequency of Communication with Friends and Family: More than three times a week    Frequency of Social Gatherings with Friends and Family: More than three times a week    Attends Religious Services: 1 to 4 times per year    Active Member of [employer] or Organizations: No    Attends Engineer, structural: Not on file    Marital Status: Married  Catering manager Violence: Not At Risk (07/18/2022)   Humiliation, Afraid, Rape, and Kick questionnaire    Fear of Current or Ex-Partner: No    Emotionally Abused: No    Physically Abused: No    Sexually Abused: No   Family History  Problem Relation Age of Onset   Alzheimer's disease Mother    Cancer Father        Prostate to bone   Diverticulitis Sister    Diabetes Son    Cancer Son    Heart disease Son    High blood pressure Son    Heart attack Son    Hypertension Son    ROS: All systems reviewed and negative except as noted in HPI.   Current Outpatient Medications  Medication Sig Dispense Refill   atorvastatin (LIPITOR) 40 MG tablet Take 1 tablet (40 mg total) by mouth daily. 90 tablet 1   calcium-vitamin D (OSCAL WITH D) 500-5 MG-MCG tablet Take 1 tablet by mouth daily with breakfast.     cetirizine (ZYRTEC) 10 MG tablet Take 10 mg by mouth as needed for allergies.     clopidogrel (PLAVIX) 75 MG tablet TAKE 1 TABLET BY MOUTH EVERY DAY 30 tablet 3   Cyanocobalamin (VITAMIN B 12 PO) Take 1 tablet by mouth daily.     cyclobenzaprine (FLEXERIL) 5 MG tablet TAKE 1 TABLET 3 TIMES A DAY AS NEEDED FOR MUSCLE SPASMS 30 tablet 1   dapagliflozin propanediol (FARXIGA) 10 MG TABS tablet TAKE ONE TABLET DAILY BEFORE BREAKFAST 60 tablet 2   ENTRESTO 97-103 MG TAKE ONE TABLET TWICE DAILY 180 tablet 3   furosemide (LASIX) 20 MG tablet Take 1 tablet (20 mg total) by mouth daily as needed. (NEEDS TO BE SEEN BEFORE NEXT REFILL) 30 tablet 5   Lancets (ONETOUCH DELICA PLUS LANCET33G) MISC Apply 1 each topically daily. 1  each 2   levothyroxine (SYNTHROID) 25 MCG tablet TAKE 1 TABLET BY MOUTH DAILY BEFORE BREAKFAST 90 tablet 1   metFORMIN (GLUCOPHAGE) 500 MG tablet TAKE 2 TABLETS (1,000 MG TOTAL) BY MOUTH 2 (TWO) TIMES DAILY WITH A MEAL. 360 tablet 0   nitroGLYCERIN (NITROSTAT) 0.4 MG SL tablet Place 1 tablet (0.4 mg total) under the tongue every 5 (five) minutes x 3 doses as needed for chest pain. 25 tablet 2   ONETOUCH VERIO test strip CHECK BLOOD SUGAR ONCE DAILY OR AS DIRECTED 50 strip 0   pantoprazole (PROTONIX) 40 MG tablet Take  1 tablet (40 mg total) by mouth daily. 30 tablet 11   rOPINIRole (REQUIP) 1 MG tablet Take 1 tablet (1 mg total) by mouth daily. 90 tablet 0   sodium chloride (OCEAN) 0.65 % SOLN nasal spray Place 1 spray into both nostrils as needed for congestion. 30 mL 3   spironolactone (ALDACTONE) 25 MG tablet Take 1 tablet (25 mg total) by mouth daily. 90 tablet 0   carvedilol (COREG) 25 MG tablet Take 1 tablet (25 mg total) by mouth 2 (two) times daily with a meal. 180 tablet 3   No current facility-administered medications for this encounter.   Wt Readings from Last 3 Encounters:  10/24/23 76 kg (167 lb 9.6 oz)  06/13/23 78.1 kg (172 lb 3.2 oz)  02/20/23 79.1 kg (174 lb 6.4 oz)   BP (!) 144/80   Pulse 80   Wt 76 kg (167 lb 9.6 oz)   SpO2 98%   BMI 29.45 kg/m  General: NAD Neck: No JVD, no thyromegaly or thyroid nodule.  Lungs: Clear to auscultation bilaterally with normal respiratory effort. CV: Nondisplaced PMI.  Heart regular S1/S2, no S3/S4, no murmur.  No peripheral edema.  No carotid bruit.  Difficult to palpate pedal pulses.  Abdomen: Soft, nontender, no hepatosplenomegaly, no distention.  Skin: Intact without lesions or rashes.  Neurologic: Alert and oriented x 3.  Psych: Normal affect. Extremities: No clubbing or cyanosis. Venous varicosities lower legs.  HEENT: Normal.   Assessment/Plan: 1. CAD: NSTEMI in 7/21 with DES x 2 to proximal LAD.  There was a chronic total  occlusion of the RCA and moderate stenosis in the PLOM.  Cardiac PET completed 3/24, in setting of atypical chest pain, which showed normal EF, evidence for small area of ischemia in RCA territory but RCA known to be totally occluded. No ischemic chest pain. - Continue Plavix 75 mg daily.    - Continue atorvastatin 80 mg daily, check lipids today.  2. Chronic systolic CHF: Ischemic cardiomyopathy.  Echo in 7/21 with EF 25-30%, akinetic septum.  Echo in 10/21 with EF up to 55-60%.  Echo in 2/23 showed EF 55-60%, apical hypokinesis, normal RV, mild AI, IVC normal.  Normal EF on cardiac PET (3/24). She is not volume overloaded on exam, NYHA class I-II.   - Continue Entresto 97/103 bid. BMET today.  - Increase Coreg to 25 mg bid with HTN.  - Continue spironolactone 25 mg daily.  - Continue Farxiga 10 mg daily. No GU symptoms. 3. PAD: ABIs (2/24) normal, but TBIs abnormal suggesting some PAD distally. No resting leg pain or pedal ulcers. Previous leg pain resolved with addition of ropinirole. - Continue statin. 4. HTN: BP running high, increase Coreg as above.  5. GERD: She is taking omeprazole which can interact with Plavix.  - Stop omeprazole, start Protonix 40 mg daily.   Follow up in 6 months  I spent 32 minutes reviewing records, interviewing/examining patient, and managing orders.   Becky Boyle  10/24/2023

## 2023-10-24 NOTE — Telephone Encounter (Signed)
 Copied from CRM [id number]. Topic: General - Call Back - No Documentation >> Oct 24, 2023 12:57 PM Becky Boyle wrote: Reason for CRM: Patient says she missed a call today from [place], not sure what it was regarding I don't see any notes in chart

## 2023-10-24 NOTE — Patient Instructions (Signed)
 INCREASE  Carvedilol to 25 mg Twice daily  START Protonix 40 mg daily.  Labs done today, your results will be available in MyChart, we will contact you for abnormal readings.  Your physician recommends that you schedule a follow-up appointment in: 6 months ( September) ** PLEASE CALL THE OFFICE IN [place] TO ARRANGE YOUR FOLLOW UP APPOINTMENT.**  If you have any questions or concerns before your next appointment please send [place] a message through [place] or call our office at [phone].    TO LEAVE A MESSAGE FOR THE NURSE SELECT OPTION 2, PLEASE LEAVE A MESSAGE INCLUDING: YOUR NAME DATE OF BIRTH CALL BACK NUMBER REASON FOR CALL**this is important as we prioritize the call backs  YOU WILL RECEIVE A CALL BACK THE SAME DAY AS LONG AS YOU CALL BEFORE 4:00 PM  At the [facility], you and your health needs are our priority. As part of our continuing mission to provide you with exceptional heart care, we have created designated Provider Care Teams. These Care Teams include your primary Cardiologist (physician) and Advanced Practice Providers (APPs- Physician Assistants and Nurse Practitioners) who all work together to provide you with the care you need, when you need it.   You may see any of the following providers on your designated Care Team at your next follow up: Dr Arvilla Meres Dr Marca Ancona Dr. Dorthula Nettles Dr. Clearnce Hasten Amy Filbert Schilder, NP Robbie Lis, [place] [facility] [place], [place] Brynda Peon, NP [place] Lee, NP Clarisa Kindred, NP Karle Plumber, PharmD Enos Fling, PharmD   Please be sure to bring in all your medications bottles to every appointment.    Thank you for choosing [facility]

## 2023-10-25 ENCOUNTER — Other Ambulatory Visit: Payer: Self-pay | Admitting: Family Medicine

## 2023-10-25 DIAGNOSIS — E1169 Type 2 diabetes mellitus with other specified complication: Secondary | ICD-10-CM

## 2023-10-25 DIAGNOSIS — I5042 Chronic combined systolic (congestive) and diastolic (congestive) heart failure: Secondary | ICD-10-CM

## 2023-11-06 ENCOUNTER — Other Ambulatory Visit: Payer: Self-pay | Admitting: Family Medicine

## 2023-11-06 DIAGNOSIS — E039 Hypothyroidism, unspecified: Secondary | ICD-10-CM

## 2023-11-06 DIAGNOSIS — Z1231 Encounter for screening mammogram for malignant neoplasm of breast: Secondary | ICD-10-CM

## 2023-11-07 ENCOUNTER — Ambulatory Visit
Admission: RE | Admit: 2023-11-07 | Discharge: 2023-11-07 | Discharge status: Home / Self Care | Source: Ambulatory Visit

## 2023-11-07 DIAGNOSIS — Z1231 Encounter for screening mammogram for malignant neoplasm of breast: Secondary | ICD-10-CM | POA: Diagnosis not present

## 2023-11-09 ENCOUNTER — Encounter: Payer: Self-pay | Admitting: Family Medicine

## 2023-12-06 ENCOUNTER — Other Ambulatory Visit (HOSPITAL_COMMUNITY): Payer: Self-pay

## 2023-12-06 DIAGNOSIS — I5042 Chronic combined systolic (congestive) and diastolic (congestive) heart failure: Secondary | ICD-10-CM

## 2023-12-06 MED ORDER — NITROGLYCERIN 0.4 MG SL SUBL: 0.4000 mg | SUBLINGUAL_TABLET | SUBLINGUAL | 2 refills | Status: DC | PRN

## 2023-12-06 MED ORDER — FUROSEMIDE 20 MG PO TABS: 20.0000 mg | ORAL_TABLET | Freq: Every day | ORAL | 5 refills | Status: DC | PRN

## 2023-12-07 ENCOUNTER — Telehealth (HOSPITAL_COMMUNITY): Payer: Self-pay | Admitting: Pharmacy Technician

## 2023-12-07 NOTE — Telephone Encounter (Signed)
 Advanced Heart Failure Patient Advocate Encounter  Patient called, stated she needed new RXs for Lasix  and Nitrostat . Sent 30 day RX request to [facility]) [facility].   Correne Dillon, CPhT

## 2023-12-11 ENCOUNTER — Ambulatory Visit: Payer: Self-pay | Admitting: Family Medicine

## 2023-12-11 ENCOUNTER — Ambulatory Visit (INDEPENDENT_AMBULATORY_CARE_PROVIDER_SITE_OTHER): Payer: No Typology Code available for payment source | Admitting: Family Medicine

## 2023-12-11 ENCOUNTER — Encounter: Payer: Self-pay | Admitting: Family Medicine

## 2023-12-11 VITALS — BP 110/70 | HR 77 | Temp 97.5°F | Ht 63.0 in | Wt 169.0 lb

## 2023-12-11 DIAGNOSIS — N183 Chronic kidney disease, stage 3 unspecified: Secondary | ICD-10-CM | POA: Diagnosis not present

## 2023-12-11 DIAGNOSIS — E1165 Type 2 diabetes mellitus with hyperglycemia: Secondary | ICD-10-CM | POA: Diagnosis not present

## 2023-12-11 DIAGNOSIS — E1169 Type 2 diabetes mellitus with other specified complication: Secondary | ICD-10-CM

## 2023-12-11 DIAGNOSIS — E1122 Type 2 diabetes mellitus with diabetic chronic kidney disease: Secondary | ICD-10-CM

## 2023-12-11 DIAGNOSIS — E039 Hypothyroidism, unspecified: Secondary | ICD-10-CM | POA: Diagnosis not present

## 2023-12-11 DIAGNOSIS — G2581 Restless legs syndrome: Secondary | ICD-10-CM

## 2023-12-11 DIAGNOSIS — Z1211 Encounter for screening for malignant neoplasm of colon: Secondary | ICD-10-CM

## 2023-12-11 DIAGNOSIS — E785 Hyperlipidemia, unspecified: Secondary | ICD-10-CM

## 2023-12-11 DIAGNOSIS — Z7984 Long term (current) use of oral hypoglycemic drugs: Secondary | ICD-10-CM | POA: Diagnosis not present

## 2023-12-11 DIAGNOSIS — E559 Vitamin D deficiency, unspecified: Secondary | ICD-10-CM | POA: Diagnosis not present

## 2023-12-11 DIAGNOSIS — I5042 Chronic combined systolic (congestive) and diastolic (congestive) heart failure: Secondary | ICD-10-CM | POA: Diagnosis not present

## 2023-12-11 LAB — TSH: TSH: 4.36 u[IU]/mL (ref 0.35–5.50)

## 2023-12-11 LAB — HEMOGLOBIN A1C: Hgb A1c MFr Bld: 7.5 % — ABNORMAL HIGH (ref 4.6–6.5)

## 2023-12-11 LAB — VITAMIN D 25 HYDROXY (VIT D DEFICIENCY, FRACTURES): VITD: 28.56 ng/mL — ABNORMAL LOW (ref 30.00–100.00)

## 2023-12-11 MED ORDER — ROPINIROLE HCL ER 2 MG PO TB24: 2.0000 mg | ORAL_TABLET | Freq: Every day | ORAL | 2 refills | Status: DC

## 2023-12-11 NOTE — Progress Notes (Signed)
 Established Patient Office Visit   Subjective:  Patient ID: Becky Boyle, female    DOB: 1951-03-14  Age: 73 y.o. MRN: [id number]  Chief Complaint  Patient presents with   Medical Management of Chronic Issues    HPI Restless Leg Syndrome: Chronic. Patient is taking Ropinirole  1mg  at night. She reports it has gradually not been working well. For the last two weeks, she has noticed more pain at night time. She has been soaking in warm Epsom salt water to help. Also, been using Voltaren gel with some relief.  Patient is complaining of insomnia. She reports she has trouble going to sleep and staying a sleep. She thinks it is because of her legs are causing pain and wakes her up. She does urinate when waking up, but does not feel that this is the cause of not sleeping. She normally goes to bed around 9:30pm ad wakes up around 4:30am.    Diabetes: Chronic. Patient is taking Farxiga  10mg  daily and Metformin  1000mg  BID.  Lab Results  Component Value Date   HGBA1C 7.5 (H) 12/11/2023    Hypothyroidism: Chronic. Patient is taking Levothyroxine  25mcg daily.  Denies any symptoms. Recent Labs       Lab Results  Component Value Date    TSH 3.82 02/05/2023      Chronic Kidney Disease: Patient is taking Farxiga  with dual therapy in protecting kidneys, chronic systolic CHF, and diabetes. On previous visit, patient was referred to nephrologist, [facility]. Been once.    Hyperlipidemia: Chronic. Patient is taking Atorvastatin  40mg  daily. Patient denies any symptoms of muscle aches. Followed by cardiology. Last seen with Dr. Peder Bourdon on 10/24/2023.  Lab Results  Component Value Date   CHOL 115 10/24/2023   HDL 29 (L) 10/24/2023   LDLCALC 52 10/24/2023   TRIG 168 (H) 10/24/2023   CHOLHDL 4.0 10/24/2023   Patient is taking Cyclobenzaprine  5mg  TID PRN from a fall in December 2025.   GERD: Patient is taking Pantoprazole  40mg  daily. Effective.   CHF: Patient is taking Spironolactone  25mg   daily. Also, take other medications that cardiology is refilling. However, this provider has refilled this medication previously.  ROS See HPI above     Objective:   BP 110/70   Pulse 77   Temp (!) 97.5 F (36.4 C) (Oral)   Ht 5\' 3"  (1.6 m)   Wt 169 lb (76.7 kg)   SpO2 95%   BMI 29.94 kg/m    Physical Exam Vitals reviewed.  Constitutional:      General: She is not in acute distress.    Appearance: Normal appearance. She is not ill-appearing, toxic-appearing or diaphoretic.  HENT:     Head: Normocephalic and atraumatic.  Eyes:     General:        Right eye: No discharge.        Left eye: No discharge.     Conjunctiva/sclera: Conjunctivae normal.  Cardiovascular:     Rate and Rhythm: Normal rate and regular rhythm.     Pulses:          Dorsalis pedis pulses are 2+ on the right side and 2+ on the left side.     Heart sounds: Normal heart sounds. No murmur heard.    No friction rub. No gallop.  Pulmonary:     Effort: Pulmonary effort is normal. No respiratory distress.     Breath sounds: Normal breath sounds.  Musculoskeletal:        General: Normal range of  motion.     Right foot: Normal range of motion. Bunion present.     Left foot: Normal range of motion.  Feet:     Right foot:     Protective Sensation: 10 sites tested.  10 sites sensed.     Skin integrity: Callus and dry skin present.     Toenail Condition: Right toenails are abnormally thick.     Left foot:     Protective Sensation: 10 sites tested.  10 sites sensed.     Skin integrity: Callus and dry skin present.     Toenail Condition: Left toenails are abnormally thick.  Skin:    General: Skin is warm and dry.  Neurological:     General: No focal deficit present.     Mental Status: She is alert and oriented to person, place, and time. Mental status is at baseline.  Psychiatric:        Mood and Affect: Mood normal.        Behavior: Behavior normal.        Thought Content: Thought content normal.         Judgment: Judgment normal.      Assessment & Plan:  Restless legs Assessment & Plan: Uncontrolled. Increase Ropinrole to 2mg  at bedtime for restless leg syndrome. Advised she may take take (2) 1mg  tablets of her remaining Ropinirole  to equal 2mg . Follow up in 1 month to see if this is effective  Orders: -     rOPINIRole  HCl ER; Take 1 tablet (2 mg total) by mouth at bedtime.  Dispense: 30 tablet; Refill: 2  Type 2 diabetes mellitus with hyperglycemia, without long-term current use of insulin  (HCC) Assessment & Plan: Stable. Continue with Farxiga  10mg  daily and Metformin  1000mg  BID. Ordered A1c. Kidney function was stable from BMP 10/24/2023. Foot exam completed today. Encouraged to obtain an eye exam. Patient is on an ACE/ARB and statin.   Orders: -     Hemoglobin A1c  Acquired hypothyroidism Assessment & Plan: Stable. Continue with Levothyroxine  25mcg daily. Ordered  TSH.     Orders: -     TSH  CKD stage 3 due to type 2 diabetes mellitus (HCC) Assessment & Plan: Continue with Farxiga  10mg  daily. Kidney function is stable based on BMI.      Hyperlipidemia due to type 2 diabetes mellitus [facility]) Assessment & Plan: Previously seen cardiology on 03/26, lipids and BMP were obtained through cardiology.    Chronic combined systolic and diastolic heart failure (HCC) Assessment & Plan: This provider is refilling Spironolactone  25mg  daily. Other medications being managed by cardiology.    Vitamin D  deficiency Assessment & Plan: Ordered vitamin D  lab.  Orders: -     VITAMIN D  25 Hydroxy (Vit-D Deficiency, Fractures)  Colon cancer screening -     Cologuard   1.Review health maintenance:  -Cologuard: Ordered   Return in about 1 month (around 01/11/2024) for AWV-telehealth Dr. Burdette Carolin; Aaron Aas   Onis Markoff, NP

## 2023-12-11 NOTE — Assessment & Plan Note (Addendum)
 This provider is refilling Spironolactone  25mg  daily. Other medications being managed by cardiology.

## 2023-12-11 NOTE — Assessment & Plan Note (Signed)
 Stable. Continue with Levothyroxine daily. Ordered  TSH.

## 2023-12-11 NOTE — Patient Instructions (Addendum)
-  It was good to see you today.  -Increase Ropinrole to 2mg  at bedtime for restless leg syndrome. You may take take (2) 1mg  tablets of your remaining Ropinirole  to equal 2mg . Follow up in 1 month to see if this is effective.  -Ordered labs. Office will call with lab results and you will see them on MyChart.  -Continue all medications. -Ordered cologuard.  -Recommend to have an eye exam by ophthalmologist.  -Need to schedule an Annual Wellness Exam.

## 2023-12-11 NOTE — Assessment & Plan Note (Signed)
Ordered vitamin D lab

## 2023-12-11 NOTE — Assessment & Plan Note (Signed)
 Continue with Farxiga  10mg  daily. Kidney function is stable based on BMI.

## 2023-12-11 NOTE — Assessment & Plan Note (Signed)
 Uncontrolled. Increase Ropinrole to 2mg  at bedtime for restless leg syndrome. Advised she may take take (2) 1mg  tablets of her remaining Ropinirole  to equal 2mg . Follow up in 1 month to see if this is effective

## 2023-12-11 NOTE — Assessment & Plan Note (Addendum)
 Stable. Continue with Farxiga  10mg  daily and Metformin  1000mg  BID. Ordered A1c. Kidney function was stable from BMP 10/24/2023. Foot exam completed today. Encouraged to obtain an eye exam. Patient is on an ACE/ARB and statin.

## 2023-12-11 NOTE — Assessment & Plan Note (Signed)
 Previously seen cardiology on 03/26, lipids and BMP were obtained through cardiology.

## 2023-12-18 DIAGNOSIS — Z1211 Encounter for screening for malignant neoplasm of colon: Secondary | ICD-10-CM | POA: Diagnosis not present

## 2023-12-23 LAB — COLOGUARD: COLOGUARD: NEGATIVE

## 2023-12-28 ENCOUNTER — Other Ambulatory Visit (HOSPITAL_COMMUNITY): Payer: Self-pay | Admitting: Cardiology

## 2023-12-31 ENCOUNTER — Other Ambulatory Visit (HOSPITAL_COMMUNITY): Payer: Self-pay

## 2023-12-31 DIAGNOSIS — I5042 Chronic combined systolic (congestive) and diastolic (congestive) heart failure: Secondary | ICD-10-CM

## 2023-12-31 MED ORDER — CLOPIDOGREL BISULFATE 75 MG PO TABS: 75.0000 mg | ORAL_TABLET | Freq: Every day | ORAL | 5 refills | Status: DC

## 2024-01-11 ENCOUNTER — Ambulatory Visit (INDEPENDENT_AMBULATORY_CARE_PROVIDER_SITE_OTHER): Admitting: Family Medicine

## 2024-01-11 ENCOUNTER — Encounter: Payer: Self-pay | Admitting: Family Medicine

## 2024-01-11 VITALS — BP 128/68 | HR 92 | Temp 99.1°F | Ht 63.0 in | Wt 166.4 lb

## 2024-01-11 DIAGNOSIS — G2581 Restless legs syndrome: Secondary | ICD-10-CM

## 2024-01-11 DIAGNOSIS — M2550 Pain in unspecified joint: Secondary | ICD-10-CM | POA: Diagnosis not present

## 2024-01-11 DIAGNOSIS — I5042 Chronic combined systolic (congestive) and diastolic (congestive) heart failure: Secondary | ICD-10-CM

## 2024-01-11 DIAGNOSIS — L309 Dermatitis, unspecified: Secondary | ICD-10-CM

## 2024-01-11 MED ORDER — GABAPENTIN 300 MG PO CAPS
300.0000 mg | ORAL_CAPSULE | Freq: Two times a day (BID) | ORAL | 0 refills | Status: DC | PRN
Start: 2024-01-11 — End: 2024-02-07

## 2024-01-11 MED ORDER — ROPINIROLE HCL 4 MG PO TABS: 4.0000 mg | ORAL_TABLET | Freq: Every day | ORAL | 0 refills | Status: DC

## 2024-01-11 MED ORDER — TRIAMCINOLONE ACETONIDE 0.1 % EX CREA
1.0000 | TOPICAL_CREAM | Freq: Two times a day (BID) | CUTANEOUS | 0 refills | Status: DC
Start: 2024-01-11 — End: 2024-04-07

## 2024-01-11 MED ORDER — DAPAGLIFLOZIN PROPANEDIOL 10 MG PO TABS: 10.0000 mg | ORAL_TABLET | Freq: Every day | ORAL | 2 refills | Status: DC

## 2024-01-11 NOTE — Progress Notes (Signed)
 Established Patient Office Visit   Subjective:  Patient ID: Becky Boyle, female    DOB: [birth year]  Age: 73 y.o. MRN: [id number]  Chief Complaint  Patient presents with   Medical Management of Chronic Issues    Pt is following up on restless leg. Pt updates the increased does of ropinirole  is not helping. Pt updates she got new cat with fleas. Got bitten by fleas 1 wk ago.    Back Pain    Pt reports she was hurting so bad yesterday and took a friend's gabapentin. It helps. The pain radiates to both to legs.    Hip Pain    Pt c/o both side of hip . Denied any injury. Noticed sx 3 days ago. Sx is constant.    Shoulder Pain    Pt c/o both shoulder pain. Denied any injury. Noticed sx 3 days ago. Sx is constant.      HPI Restless Leg Syndrome: On last appointment, increased Ropinirole  1mg  to 2mg  at night. She is having trouble with getting her legs to be still and not hurt. She reports this increase in dose didn't help much.   Patient reports she got bitten about a week ago by fleas from a new cat. She reports this occurred on her right upper arm. She reports it itches, but is improving.   Patient has been experiencing bilateral shoulder and hip pain. Started all of a sudden 3 days ago. Constant, intensity does not change. Described as sharp pain. She has been taking Tylenol  with arthritis and OTC Voltaren gel. She has tried OTC Salonpas patches that has seemed to help. Also, tried a friend's Gabapentin yesterday that seemed to help and heating pad yesterday. Seemed to help. Denies injury.   Also, been feeling fatigued for the last two weeks. 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 She reports she is not sleeping as much since the loss of her client.   Patient is asking a refill on Farxiga  for CHF from cardiology. She reports she has tried to get in refilled by cardiology office, but pharmacy can not get in touch with them. She reports she is out of medication.  ROS See HPI  above     Objective:   BP 128/68 (BP Location: Left Arm, Patient Position: Sitting, Cuff Size: Normal)   Pulse 92   Temp 99.1 F (37.3 C) (Oral)   Ht 5' 3 (1.6 m)   Wt 166 lb 6.4 oz (75.5 kg)   SpO2 95%   BMI 29.48 kg/m    Physical Exam Vitals reviewed.  Constitutional:      General: She is not in acute distress.    Appearance: Normal appearance. She is overweight. She is not ill-appearing, toxic-appearing or diaphoretic.  HENT:     Head: Normocephalic and atraumatic.   Eyes:     General:        Right eye: No discharge.        Left eye: No discharge.     Conjunctiva/sclera: Conjunctivae normal.    Cardiovascular:     Rate and Rhythm: Normal rate and regular rhythm.     Heart sounds: Normal heart sounds. No murmur heard.    No friction rub. No gallop.  Pulmonary:     Effort: Pulmonary effort is normal. No respiratory distress.     Breath sounds: Normal breath sounds.   Musculoskeletal:        General: Normal range of motion.   Skin:  General: Skin is warm and dry.     Findings: Rash (See picture: right upper arm) present.   Neurological:     General: No focal deficit present.     Mental Status: She is alert and oriented to person, place, and time. Mental status is at baseline.   Psychiatric:        Mood and Affect: Mood normal.        Behavior: Behavior normal.        Thought Content: Thought content normal.        Judgment: Judgment normal.         Assessment & Plan:  Restless legs -     rOPINIRole  HCl; Take 1 tablet (4 mg total) by mouth at bedtime.  Dispense: 30 tablet; Refill: 0  Chronic combined systolic and diastolic heart failure (HCC) -     Dapagliflozin  Propanediol; Take 1 tablet (10 mg total) by mouth daily.  Dispense: 30 tablet; Refill: 2  Dermatitis -     Triamcinolone Acetonide; Apply 1 Application topically 2 (two) times daily.  Dispense: 30 g; Refill: 0  Arthralgia, unspecified joint -     Gabapentin; Take 1 capsule (300 mg total)  by mouth 2 (two) times daily as needed.  Dispense: 60 capsule; Refill: 0  -Refilled Farxiga  for congestive heart failure on behalf of cardiology so that you would not go without your medication. Try to contact cardiology for future refills. -Increase Ropinirole  to 4mg  at night for restless leg. May take (2) 2mg  tablets to equal 4mg . Sent in a prescription for a 4mg  tablet and will need to only take 1 tablet.  -Prescribed Triamcinolone Cream to apply to right upper arm. Cleanse with a mild soap, pat dry, and apply a thin layer to the area.  -Prescribed Gabapentin 300mg  tablet, 1 tablet twice a day as needed for muscle pain. Caution: Medication can cause drowsiness.  -Offered a referral for counseling with a recent loss. Advised if she changes her mind, please call back to the office and will send a referral.  -Follow up in 1 month.   Return in about 1 month (around 02/10/2024) for follow-up.   Mousa Prout, NP

## 2024-01-11 NOTE — Patient Instructions (Signed)
-  It was great to see you today. -Refilled Farxiga  for congestive heart failure on behalf of cardiology so that you would not go without your medication. Try to contact cardiology for future refills. -Increase Ropinirole  to 4mg  at night for restless leg. May take (2) 2mg  tablets to equal 4mg . Sent in a prescription for a 4mg  tablet and will need to only take 1 tablet.  -Prescribed Triamcinolone Cream to apply to right upper arm. Cleanse with a mild soap, pat dry, and apply a thin layer to the area.  -Prescribed Gabapentin 300mg  tablet, 1 tablet twice a day as needed for muscle pain. Caution: Medication can cause drowsiness.  -Offered a referral for counseling with a recent loss. If you change your mind, please call back to the office and will send a referral.  -Follow up in 1 month.

## 2024-01-31 ENCOUNTER — Ambulatory Visit: Admitting: Family Medicine

## 2024-01-31 DIAGNOSIS — Z0000 Encounter for general adult medical examination without abnormal findings: Secondary | ICD-10-CM

## 2024-01-31 NOTE — Patient Instructions (Signed)
 I really enjoyed getting to talk with you today! I am available on Tuesdays and Thursdays for virtual visits if you have any questions or concerns, or if I can be of any further assistance.   CHECKLIST FROM ANNUAL WELLNESS VISIT:  -Follow up (please call to schedule if not scheduled after visit):   -yearly for annual wellness visit with primary care office  Here is a list of your preventive care/health maintenance measures and the plan for each if any are due:  PLAN For any measures below that may be due:    1.) Please schedule your diabetic eye exam and your dental exam.   2.) Please consider getting your flu and covid vaccines at the pharmacy when available. Please let us  know if you do so that we can update your records.   Health Maintenance  Topic Date Due   OPHTHALMOLOGY EXAM  10/11/2023   COVID-19 Vaccine (8 - 2024-25 season) 01/01/2024   INFLUENZA VACCINE  02/29/2024   HEMOGLOBIN A1C  06/12/2024   Diabetic kidney evaluation - Urine ACR  06/21/2024   DEXA SCAN  08/25/2024   Diabetic kidney evaluation - eGFR measurement  10/23/2024   MAMMOGRAM  11/06/2024   FOOT EXAM  12/10/2024   Medicare Annual Wellness (AWV)  01/30/2025   Fecal DNA (Cologuard)  12/18/2026   DTaP/Tdap/Td (2 - Td or Tdap) 08/10/2032   Pneumococcal Vaccine: 50+ Years  Completed   Hepatitis C Screening  Completed   Zoster Vaccines- Shingrix  Completed   Hepatitis B Vaccines  Aged Out   HPV VACCINES  Aged Out   Meningococcal B Vaccine  Aged Out    -See a dentist at least yearly  -Get your eyes checked and then per your eye specialist's recommendations  -Other issues addressed today:   -I have included below further information regarding a healthy whole foods based diet, physical activity guidelines for adults, stress management and opportunities for social connections. I hope you find this information useful.    -----------------------------------------------------------------------------------------------------------------------------------------------------------------------------------------------------------------------------------------------------------    NUTRITION: -eat real food: lots of colorful vegetables (half the plate) and fruits -5-7 servings of vegetables and fruits per day (fresh or steamed is best), exp. 2 servings of vegetables with lunch and dinner and 2 servings of fruit per day. Berries and greens such as kale and collards are great choices.  -consume on a regular basis:  fresh fruits, fresh veggies, fish, nuts, seeds, healthy oils (such as olive oil, avocado oil), whole grains (make sure for bread/pasta/crackers/etc., that the first ingredient on label contains the word whole), legumes. -can eat small amounts of dairy and lean meat (no larger than the palm of your hand), but avoid processed meats such as ham, bacon, lunch meat, etc. -drink water -try to avoid fast food and pre-packaged foods, processed meat, ultra processed foods/beverages (donuts, candy, etc.) -most experts advise limiting sodium to < 2300mg  per day, should limit further is any chronic conditions such as high blood pressure, heart disease, diabetes, etc. The American Heart Association advised that < 1500mg  is is ideal -try to avoid foods/beverages that contain any ingredients with names you do not recognize  -try to avoid foods/beverages  with added sugar or sweeteners/sweets  -try to avoid sweet drinks (including diet drinks): soda, juice, Gatorade, sweet tea, power drinks, diet drinks -try to avoid white rice, white bread, pasta (unless whole grain)  EXERCISE GUIDELINES FOR ADULTS: -if you wish to increase your physical activity, do so gradually and with the approval of your doctor -STOP  and seek medical care immediately if you have any chest pain, chest discomfort or trouble breathing when starting or  increasing exercise  -move and stretch your body, legs, feet and arms when sitting for long periods -Physical activity guidelines for optimal health in adults: -get at least 150 minutes per week of moderate exercise (can talk, but not sing); this is about 20-30 minutes of sustained activity 5-7 days per week or two 10-15 minute episodes of sustained activity 5-7 days per week -do some muscle building/resistance training/strength training at least 2 days per week  -balance exercises 3+ days per week:   Stand somewhere where you have something sturdy to hold onto if you lose balance    1) lift up on toes, then back down, start with 5x per day and work up to 20x   2) stand and lift one leg straight out to the side so that foot is a few inches of the floor, start with 5x each side and work up to 20x each side   3) stand on one foot, start with 5 seconds each side and work up to 20 seconds on each side  If you need ideas or help with getting more active:  -Silver sneakers https://tools.silversneakers.com  -Walk with a Doc: http://www.duncan-williams.com/  -try to include resistance (weight lifting/strength building) and balance exercises twice per week: or the following link for ideas: http://castillo-powell.com/  BuyDucts.dk  STRESS MANAGEMENT: -can try meditating, or just sitting quietly with deep breathing while intentionally relaxing all parts of your body for 5 minutes daily -if you need further help with stress, anxiety or depression please follow up with your primary doctor or contact the wonderful folks at [facility]: [phone]  SOCIAL CONNECTIONS: -options in [place] if you wish to engage in more social and exercise related activities:  -Silver sneakers https://tools.silversneakers.com  -Walk with a Doc: http://www.duncan-williams.com/  -Check out the [facility] Active Adults 50+  section on the [place] of [employer] (hiking clubs, book clubs, cards and games, chess, exercise classes, aquatic classes and much more) - see the website for details: https://www.[place].gov/departments/parks-recreation/active-adults50  -YouTube has lots of exercise videos for different ages and abilities as well  -[facility] (a variety of indoor and outdoor inperson activities for adults). [phone]. [address]  -Virtual Online Classes (a variety of topics): see seniorplanet.org or call [phone]  -consider volunteering at a school, hospice center, church, senior center or elsewhere          ADVANCED HEALTHCARE DIRECTIVES:  [place] Advanced Directives assistance:   ExpressWeek.com.cy  Everyone should have advanced health care directives in place. This is so that you get the care you want, should you ever be in a situation where you are unable to make your own medical decisions.   From the [place] Advanced Directive Website: Advance Health Care Directives are legal documents in which you give written instructions about your health care if, in the future, you cannot speak for yourself.   A health care power of attorney allows you to name a person you trust to make your health care decisions if you cannot make them yourself. A declaration of a desire for a natural death (or living will) is document, which states that you desire not to have your life prolonged by extraordinary measures if you have a terminal or incurable illness or if you are in a vegetative state. An advance instruction for mental health treatment makes a declaration of instructions, information and preferences regarding your mental health  treatment. It also states that you are aware that the advance instruction authorizes a mental health treatment provider to act according to your wishes. It may also outline your consent or  refusal of mental health treatment. A declaration of an anatomical gift allows anyone over the age of 67 to make a gift by will, organ donor card or other document.   Please see the following website or an elder law attorney for forms, FAQs and for completion of advanced directives: [place]  Print production planner Health Care Directives Advance Health Care Directives (http://guzman.[email or web address]/)  Or copy and paste the following to your web browser: PoshChat.fi

## 2024-01-31 NOTE — Progress Notes (Signed)
 PATIENT CHECK-IN and HEALTH RISK ASSESSMENT QUESTIONNAIRE:  -completed by phone/video for upcoming Medicare Preventive Visit   Pre-Visit Check-in: 1)Vitals (height, wt, BP, etc) - record in vitals section for visit on day of visit Request home vitals (wt, BP, etc.) and enter into vitals, THEN update Vital Signs SmartPhrase below at the top of the HPI. See below.  2)Review and Update Medications, Allergies PMH, Surgeries, Social history in Epic 3)Hospitalizations in the last year with date/reason? n  4)Review and Update Care Team (patient's specialists) in Epic 5) Complete PHQ9 in Epic  6) Complete Fall Screening in Epic 7)Review all Health Maintenance Due and order under PCP if not done.  Medicare Wellness Patient Questionnaire:  Answer theses question about your habits: How often do you have a drink containing alcohol?never How many drinks containing alcohol do you have on a typical day when you are drinking?n How often do you have six or more drinks on one occasion?n Have you ever smoked?n How many packs a day do/did you smoke? n Do you use smokeless tobacco?n Do you use an illicit drugs?n On average, how many days per week do you engage in moderate to strenuous exercise (like a brisk walk)? daily On average, how many minutes do you engage in exercise at this level? 30 minutes Typical diet: lots of veggies, proteins, some sweets - ice cream, some starches - mostly home cooked  Beverages: mostly water, some tea  Answer theses question about your everyday activities: Can you perform most household chores?y Are you deaf or have significant trouble hearing?n Do you feel that you have a problem with memory?n Do you feel safe at home?y Last dentist visit? Needs to schedule 8. Do you have any difficulty performing your everyday activities?n Are you having any difficulty walking, taking medications on your own, and or difficulty managing daily home needs?n Do you have difficulty  walking or climbing stairs?n Do you have difficulty dressing or bathing?n Do you have difficulty doing errands alone such as visiting a doctor's office or shopping?n Do you currently have any difficulty preparing food and eating?n Do you currently have any difficulty using the toilet?n Do you have any difficulty managing your finances?n Do you have any difficulties with housekeeping of managing your housekeeping?n   Do you have Advanced Directives in place (Living Will, Healthcare Power or Attorney)? N, in the progress   Last eye Exam and location? Reports has not seen an eye doctor in 1 year, usually sees lens crafter   Do you currently use prescribed or non-prescribed narcotic or opioid pain medications?n  Do you have a history or close family history of breast, ovarian, tubal or peritoneal cancer or a family member with BRCA (breast cancer susceptibility 1 and 2) gene mutations? n   ----------------------------------------------------------------------------------------------------------------------------------------------------------------------------------------------------------------------  Because this visit was a virtual/telehealth visit, some criteria may be missing or patient reported. Any vitals not documented were not able to be obtained and vitals that have been documented are patient reported.    MEDICARE ANNUAL PREVENTIVE VISIT WITH PROVIDER: (Welcome to Medicare, initial annual wellness or annual wellness exam)  Virtual Visit via Phone Note  I connected with Becky Boyle on 01/31/24 by phone and verified that I am speaking with the correct person using two identifiers. She prefers a phone visit.   Location patient: home Location provider:work or home office Persons participating in the virtual visit: patient, provider  Concerns and/or follow up today: no concerns.    See HM section in Epic for other  details of completed HM.    ROS: negative for report of  fevers, unintentional weight loss, vision changes, vision loss, hearing loss or change, chest pain, sob, hemoptysis, melena, hematochezia, hematuria, falls, bleeding or bruising, thoughts of suicide or self harm, memory loss  Patient-completed extensive health risk assessment - reviewed and discussed with the patient: See Health Risk Assessment completed with patient prior to the visit either above or in recent phone note. This was reviewed in detailed with the patient today and appropriate recommendations, orders and referrals were placed as needed per Summary below and patient instructions.   Review of Medical History: -PMH, PSH, Family History and current specialty and care providers reviewed and updated and listed below   Patient Care Team: Billy Philippe SAUNDERS, NP as PCP - General (Family Medicine) Billy Philippe SAUNDERS, NP as Nurse Practitioner (Family Medicine) Lourdes Craft   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HCC)    CKD (chronic kidney disease) stage 3, GFR 30-59 ml/min (HCC)    Depression    Diabetes (HCC)    Generalized anxiety disorder with panic attacks 09/24/2020   Heart failure (HCC)    History of non-ST elevation myocardial infarction (NSTEMI)    Vitamin B12 deficiency 11/04/2021   Vitamin D  deficiency     Past Surgical History:  Procedure Laterality Date   ABDOMINAL HYSTERECTOMY     asemia     BRAIN SURGERY  1989   tumor    CORONARY STENT INTERVENTION N/A 02/17/2020   Procedure: CORONARY STENT INTERVENTION;  Surgeon: Dann Candyce RAMAN, MD;  Location: [facility] INVASIVE CV LAB;  Service: Cardiovascular;  Laterality: N/A;   RIGHT/LEFT HEART CATH AND CORONARY ANGIOGRAPHY N/A 02/17/2020   Procedure: RIGHT/LEFT HEART CATH AND CORONARY ANGIOGRAPHY;  Surgeon: Dann Candyce RAMAN, MD;  Location: [facility] INVASIVE CV LAB;  Service: Cardiovascular;  Laterality: N/A;    Social History   Socioeconomic History   Marital  status: Married    Spouse name: Not on file   Number of children: 4   Years of education: Not on file   Highest education level: 12th grade  Occupational History   Occupation: retired  Tobacco Use   Smoking status: Never   Smokeless tobacco: Never  Vaping Use   Vaping status: Never Used  Substance and Sexual Activity   Alcohol use: No   Drug use: No   Sexual activity: Not Currently  Other Topics Concern   Not on file  Social History Narrative   Patient lives with spouse. 2 dogs.    Social Drivers of Corporate investment banker Strain: Low Risk  (01/30/2024)   Overall Financial Resource Strain (CARDIA)    Difficulty of Paying Living Expenses: Not very hard  Food Insecurity: No Food Insecurity (01/30/2024)   Hunger Vital Sign    Worried About Running Out of Food in the Last Year: Never true    Ran Out of Food in the Last Year: Never true  Recent Concern: Food Insecurity - Food Insecurity Present (12/10/2023)   Hunger Vital Sign    Worried About Running Out of Food in the Last Year: Sometimes true    Ran Out of Food in the Last Year: Never true  Transportation Needs: No Transportation Needs (01/30/2024)   PRAPARE - Administrator, Civil Service (Medical): No    Lack of Transportation (Non-Medical): No  Physical Activity: Sufficiently Active (01/30/2024)   Exercise Vital  Sign    Days of Exercise per Week: 7 days    Minutes of Exercise per Session: 30 min  Recent Concern: Physical Activity - Insufficiently Active (12/10/2023)   Exercise Vital Sign    Days of Exercise per Week: 4 days    Minutes of Exercise per Session: 30 min  Stress: Stress Concern Present (01/30/2024)   [employer] of Occupational Health - Occupational Stress Questionnaire    Feeling of Stress: To some extent  Social Connections: Moderately Integrated (01/30/2024)   Social Connection and Isolation Panel    Frequency of Communication with Friends and Family: More than three times a week    Frequency  of Social Gatherings with Friends and Family: Twice a week    Attends Religious Services: More than 4 times per year    Active Member of [employer] or Organizations: No    Attends Banker Meetings: Not on file    Marital Status: Married  Catering manager Violence: Not At Risk (07/18/2022)   Humiliation, Afraid, Rape, and Kick questionnaire    Fear of Current or Ex-Partner: No    Emotionally Abused: No    Physically Abused: No    Sexually Abused: No    Family History  Problem Relation Age of Onset   Alzheimer's disease Mother    Cancer Father        Prostate to bone   Diverticulitis Sister    Diabetes Son    Cancer Son    Heart disease Son    High blood pressure Son    Heart attack Son    Hypertension Son     Current Outpatient Medications on File Prior to Visit  Medication Sig Dispense Refill   atorvastatin  (LIPITOR ) 40 MG tablet TAKE 1 TABLET BY MOUTH EVERY DAY 90 tablet 1   calcium -vitamin D  (OSCAL WITH D) 500-5 MG-MCG tablet Take 1 tablet by mouth daily with breakfast.     carvedilol  (COREG ) 25 MG tablet Take 1 tablet (25 mg total) by mouth 2 (two) times daily with a meal. 180 tablet 3   cetirizine (ZYRTEC) 10 MG tablet Take 10 mg by mouth as needed for allergies.     clopidogrel  (PLAVIX ) 75 MG tablet Take 1 tablet (75 mg total) by mouth daily. 30 tablet 5   Cyanocobalamin  (VITAMIN B 12 PO) Take 1 tablet by mouth daily.     dapagliflozin  propanediol (FARXIGA ) 10 MG TABS tablet Take 1 tablet (10 mg total) by mouth daily. 30 tablet 2   ENTRESTO  97-103 MG TAKE ONE TABLET TWICE DAILY 180 tablet 3   furosemide  (LASIX ) 20 MG tablet Take 1 tablet (20 mg total) by mouth daily as needed. 30 tablet 5   gabapentin  (NEURONTIN ) 300 MG capsule Take 1 capsule (300 mg total) by mouth 2 (two) times daily as needed. 60 capsule 0   Lancets (ONETOUCH DELICA PLUS LANCET33G) MISC Apply 1 each topically daily. 1 each 2   metFORMIN  (GLUCOPHAGE ) 500 MG tablet TAKE 2 TABLETS (1,000 MG TOTAL)  BY MOUTH 2 (TWO) TIMES DAILY WITH A MEAL. 360 tablet 0   nitroGLYCERIN  (NITROSTAT ) 0.4 MG SL tablet Place 1 tablet (0.4 mg total) under the tongue every 5 (five) minutes x 3 doses as needed for chest pain. 25 tablet 2   ONETOUCH VERIO test strip CHECK BLOOD SUGAR ONCE DAILY OR AS DIRECTED 50 strip 0   pantoprazole  (PROTONIX ) 40 MG tablet Take 1 tablet (40 mg total) by mouth daily. 30 tablet 11   rOPINIRole  (REQUIP )  4 MG tablet Take 1 tablet (4 mg total) by mouth at bedtime. 30 tablet 0   sodium chloride  (OCEAN) 0.65 % SOLN nasal spray Place 1 spray into both nostrils as needed for congestion. 30 mL 3   spironolactone  (ALDACTONE ) 25 MG tablet TAKE 1 TABLET (25 MG TOTAL) BY MOUTH DAILY. 90 tablet 0   SYNTHROID  25 MCG tablet PLEASE SPECIFY DIRECTIONS, REFILLS AND QUANTITY 90 tablet 0   triamcinolone  cream (KENALOG ) 0.1 % Apply 1 Application topically 2 (two) times daily. 30 g 0   No current facility-administered medications on file prior to visit.    Allergies  Allergen Reactions   Latex Anaphylaxis   Penicillins Hives   Codeine Rash   Tape Rash       Physical Exam Vitals requested from patient and listed below if patient had equipment and was able to obtain at home for this virtual visit: There were no vitals filed for this visit. Estimated body mass index is 29.48 kg/m as calculated from the following:   Height as of 01/11/24: 5' 3 (1.6 m).   Weight as of 01/11/24: 166 lb 6.4 oz (75.5 kg).  EKG (optional): deferred due to virtual visit  GENERAL: alert, oriented, no acute distress detected, full vision exam deferred due to pandemic and/or virtual encounter  PSYCH/NEURO: pleasant and cooperative, no obvious depression or anxiety, speech and thought processing grossly intact, Cognitive function grossly intact  Flowsheet Row Office Visit from 06/13/2023 in [facility] [place]  PHQ-9 Total Score 0        01/31/2024   10:11 AM 06/13/2023    6:59 AM 02/05/2023     3:40 PM 12/04/2022    3:37 PM 11/02/2022    7:50 AM  Depression screen PHQ 2/9  Decreased Interest 0 0 0 0 0  Down, Depressed, Hopeless 0 0 0 0 0  PHQ - 2 Score 0 0 0 0 0  Altered sleeping  0 1 0 1  Tired, decreased energy  0 0 0 1  Change in appetite  0 0 0 0  Feeling bad or failure about yourself   0 0 0 0  Trouble concentrating  0 0 0 0  Moving slowly or fidgety/restless  0 0 0 0  Suicidal thoughts  0 0 0 0  PHQ-9 Score  0 1 0 2  Difficult doing work/chores  Not difficult at all Not difficult at all Not difficult at all Not difficult at all       02/05/2023    3:36 PM 06/09/2023    9:02 AM 06/13/2023    6:59 AM 01/30/2024    4:52 PM 01/31/2024   10:02 AM  Fall Risk  Falls in the past year? 0 0 0 0 0  Was there an injury with Fall? 0  0 0 0  Fall Risk Category Calculator 0  0 0  0  Patient at Risk for Falls Due to No Fall Risks  No Fall Risks    Fall risk Follow up Falls evaluation completed  Falls evaluation completed  Falls evaluation completed     Patient-reported     SUMMARY AND PLAN:  Encounter for Medicare annual wellness exam   Discussed applicable health maintenance/preventive health measures and advised and referred or ordered per patient preferences: -she agrees to schedule eye and dental exams -discussed recs for covid and flu vaccines and she plans to do at her pharmacy Health Maintenance  Topic Date Due   OPHTHALMOLOGY EXAM  10/11/2023   COVID-19 Vaccine (8 - 2024-25 season) 01/01/2024   INFLUENZA VACCINE  02/29/2024   HEMOGLOBIN A1C  06/12/2024   Diabetic kidney evaluation - Urine ACR  06/21/2024   DEXA SCAN  08/25/2024   Diabetic kidney evaluation - eGFR measurement  10/23/2024   MAMMOGRAM  11/06/2024   FOOT EXAM  12/10/2024   Medicare Annual Wellness (AWV)  01/30/2025   Fecal DNA (Cologuard)  12/18/2026   DTaP/Tdap/Td (2 - Td or Tdap) 08/10/2032   Pneumococcal Vaccine: 50+ Years  Completed   Hepatitis C Screening  Completed   Zoster Vaccines-  Shingrix  Completed   Hepatitis B Vaccines  Aged Out   HPV VACCINES  Aged Out   Meningococcal B Vaccine  Aged [employer] and counseling on the following was provided based on the above review of health and a plan/checklist for the patient, along with additional information discussed, was provided for the patient in the patient instructions :  -Advised on importance of completing advanced directives, discussed options for completing and provided information in patient instructions as well -Advised and counseled on a healthy lifestyle  -Reviewed patient's current diet. Advised and counseled on a whole foods based healthy diet. A summary of a healthy diet was provided in the Patient Instructions.  -reviewed patient's current physical activity level and discussed exercise guidelines for adults. Discussed community resources and ideas for safe exercise at home to assist in meeting exercise guideline recommendations in a safe and healthy way.  -Advise yearly dental visits at minimum and regular eye exams   Follow up: see patient instructions     Patient Instructions  I really enjoyed getting to talk with you today! I am available on Tuesdays and Thursdays for virtual visits if you have any questions or concerns, or if I can be of any further assistance.   CHECKLIST FROM ANNUAL WELLNESS VISIT:  -Follow up (please call to schedule if not scheduled after visit):   -yearly for annual wellness visit with primary care office  Here is a list of your preventive care/health maintenance measures and the plan for each if any are due:  PLAN For any measures below that may be due:    1.) Please schedule your diabetic eye exam and your dental exam.   2.) Please consider getting your flu and covid vaccines at the pharmacy when available. Please let us  know if you do so that we can update your records.   Health Maintenance  Topic Date Due   OPHTHALMOLOGY EXAM  10/11/2023   COVID-19 Vaccine  (8 - 2024-25 season) 01/01/2024   INFLUENZA VACCINE  02/29/2024   HEMOGLOBIN A1C  06/12/2024   Diabetic kidney evaluation - Urine ACR  06/21/2024   DEXA SCAN  08/25/2024   Diabetic kidney evaluation - eGFR measurement  10/23/2024   MAMMOGRAM  11/06/2024   FOOT EXAM  12/10/2024   Medicare Annual Wellness (AWV)  01/30/2025   Fecal DNA (Cologuard)  12/18/2026   DTaP/Tdap/Td (2 - Td or Tdap) 08/10/2032   Pneumococcal Vaccine: 50+ Years  Completed   Hepatitis C Screening  Completed   Zoster Vaccines- Shingrix  Completed   Hepatitis B Vaccines  Aged Out   HPV VACCINES  Aged Out   Meningococcal B Vaccine  Aged Out    -See a dentist at least yearly  -Get your eyes checked and then per your eye specialist's recommendations  -Other issues addressed today:   -I have included below further information  regarding a healthy whole foods based diet, physical activity guidelines for adults, stress management and opportunities for social connections. I hope you find this information useful.   -----------------------------------------------------------------------------------------------------------------------------------------------------------------------------------------------------------------------------------------------------------    NUTRITION: -eat real food: lots of colorful vegetables (half the plate) and fruits -5-7 servings of vegetables and fruits per day (fresh or steamed is best), exp. 2 servings of vegetables with lunch and dinner and 2 servings of fruit per day. Berries and greens such as kale and collards are great choices.  -consume on a regular basis:  fresh fruits, fresh veggies, fish, nuts, seeds, healthy oils (such as olive oil, avocado oil), whole grains (make sure for bread/pasta/crackers/etc., that the first ingredient on label contains the word whole), legumes. -can eat small amounts of dairy and lean meat (no larger than the palm of your hand), but avoid processed  meats such as ham, bacon, lunch meat, etc. -drink water -try to avoid fast food and pre-packaged foods, processed meat, ultra processed foods/beverages (donuts, candy, etc.) -most experts advise limiting sodium to < 2300mg  per day, should limit further is any chronic conditions such as high blood pressure, heart disease, diabetes, etc. The American Heart Association advised that < 1500mg  is is ideal -try to avoid foods/beverages that contain any ingredients with names you do not recognize  -try to avoid foods/beverages  with added sugar or sweeteners/sweets  -try to avoid sweet drinks (including diet drinks): soda, juice, Gatorade, sweet tea, power drinks, diet drinks -try to avoid white rice, white bread, pasta (unless whole grain)  EXERCISE GUIDELINES FOR ADULTS: -if you wish to increase your physical activity, do so gradually and with the approval of your doctor -STOP and seek medical care immediately if you have any chest pain, chest discomfort or trouble breathing when starting or increasing exercise  -move and stretch your body, legs, feet and arms when sitting for long periods -Physical activity guidelines for optimal health in adults: -get at least 150 minutes per week of moderate exercise (can talk, but not sing); this is about 20-30 minutes of sustained activity 5-7 days per week or two 10-15 minute episodes of sustained activity 5-7 days per week -do some muscle building/resistance training/strength training at least 2 days per week  -balance exercises 3+ days per week:   Stand somewhere where you have something sturdy to hold onto if you lose balance    1) lift up on toes, then back down, start with 5x per day and work up to 20x   2) stand and lift one leg straight out to the side so that foot is a few inches of the floor, start with 5x each side and work up to 20x each side   3) stand on one foot, start with 5 seconds each side and work up to 20 seconds on each side  If you need  ideas or help with getting more active:  -Silver sneakers https://tools.silversneakers.com  -Walk with a Doc: http://www.duncan-williams.com/  -try to include resistance (weight lifting/strength building) and balance exercises twice per week: or the following link for ideas: http://castillo-powell.com/  BuyDucts.dk  STRESS MANAGEMENT: -can try meditating, or just sitting quietly with deep breathing while intentionally relaxing all parts of your body for 5 minutes daily -if you need further help with stress, anxiety or depression please follow up with your primary doctor or contact the wonderful folks at [facility]: [phone]  SOCIAL CONNECTIONS: -options in [place] if you wish to engage in more social and exercise related activities:  -Silver sneakers  https://tools.silversneakers.com  -Walk with a Doc: http://www.duncan-williams.com/  -Check out the [facility] Active Adults 50+ section on the [employer] of [employer] (hiking clubs, book clubs, cards and games, chess, exercise classes, aquatic classes and much more) - see the website for details: https://www.[place].gov/departments/parks-recreation/active-adults50  -YouTube has lots of exercise videos for different ages and abilities as well  -[facility] (a variety of indoor and outdoor inperson activities for adults). [phone]. [address].  -Virtual Online Classes (a variety of topics): see seniorplanet.org or call [phone]  -consider volunteering at a school, hospice center, church, senior center or elsewhere          ADVANCED HEALTHCARE DIRECTIVES:  [place] Advanced Directives assistance:   ExpressWeek.com.cy  Everyone should have advanced health care directives in place. This is so that you get the care you want, should you  ever be in a situation where you are unable to make your own medical decisions.   From the [place] Advanced Directive Website: Advance Health Care Directives are legal documents in which you give written instructions about your health care if, in the future, you cannot speak for yourself.   A health care power of attorney allows you to name a person you trust to make your health care decisions if you cannot make them yourself. A declaration of a desire for a natural death (or living will) is document, which states that you desire not to have your life prolonged by extraordinary measures if you have a terminal or incurable illness or if you are in a vegetative state. An advance instruction for mental health treatment makes a declaration of instructions, information and preferences regarding your mental health treatment. It also states that you are aware that the advance instruction authorizes a mental health treatment provider to act according to your wishes. It may also outline your consent or refusal of mental health treatment. A declaration of an anatomical gift allows anyone over the age of 7 to make a gift by will, organ donor card or other document.   Please see the following website or an elder law attorney for forms, FAQs and for completion of advanced directives: [place]  Print production planner Health Care Directives Advance Health Care Directives (http://guzman.[email or web address]/)  Or copy and paste the following to your web browser: [email or web address]    Chiquita JONELLE Cramp, DO

## 2024-02-01 ENCOUNTER — Other Ambulatory Visit: Payer: Self-pay | Admitting: Family Medicine

## 2024-02-01 DIAGNOSIS — E039 Hypothyroidism, unspecified: Secondary | ICD-10-CM

## 2024-02-02 ENCOUNTER — Other Ambulatory Visit: Payer: Self-pay | Admitting: Family Medicine

## 2024-02-02 DIAGNOSIS — G2581 Restless legs syndrome: Secondary | ICD-10-CM

## 2024-02-05 ENCOUNTER — Other Ambulatory Visit: Payer: Self-pay | Admitting: Family Medicine

## 2024-02-05 DIAGNOSIS — E039 Hypothyroidism, unspecified: Secondary | ICD-10-CM

## 2024-02-07 ENCOUNTER — Other Ambulatory Visit: Payer: Self-pay | Admitting: Family Medicine

## 2024-02-07 DIAGNOSIS — M2550 Pain in unspecified joint: Secondary | ICD-10-CM

## 2024-02-11 ENCOUNTER — Other Ambulatory Visit: Payer: Self-pay | Admitting: Family Medicine

## 2024-02-11 DIAGNOSIS — E1165 Type 2 diabetes mellitus with hyperglycemia: Secondary | ICD-10-CM

## 2024-02-13 ENCOUNTER — Ambulatory Visit: Admitting: Family Medicine

## 2024-02-25 ENCOUNTER — Telehealth: Payer: Self-pay

## 2024-02-25 DIAGNOSIS — I5042 Chronic combined systolic (congestive) and diastolic (congestive) heart failure: Secondary | ICD-10-CM

## 2024-02-25 NOTE — Telephone Encounter (Signed)
 Received medication refill via fax for Farxiga  10mg  tablets. Provider is not at this location, sending to [facility].

## 2024-02-26 MED ORDER — DAPAGLIFLOZIN PROPANEDIOL 10 MG PO TABS: 10.0000 mg | ORAL_TABLET | Freq: Every day | ORAL | 2 refills | Status: AC

## 2024-02-26 NOTE — Addendum Note (Signed)
 Addended by: BRIEN SONG A on: 02/26/2024 01:26 PM   Modules accepted: Orders

## 2024-03-01 ENCOUNTER — Other Ambulatory Visit (HOSPITAL_COMMUNITY): Payer: Self-pay | Admitting: Cardiology

## 2024-03-06 ENCOUNTER — Other Ambulatory Visit: Payer: Self-pay | Admitting: Family Medicine

## 2024-03-06 DIAGNOSIS — M2550 Pain in unspecified joint: Secondary | ICD-10-CM

## 2024-03-28 ENCOUNTER — Other Ambulatory Visit: Payer: Self-pay | Admitting: Family Medicine

## 2024-03-28 DIAGNOSIS — I5042 Chronic combined systolic (congestive) and diastolic (congestive) heart failure: Secondary | ICD-10-CM

## 2024-04-06 ENCOUNTER — Other Ambulatory Visit: Payer: Self-pay | Admitting: Family Medicine

## 2024-04-06 DIAGNOSIS — M2550 Pain in unspecified joint: Secondary | ICD-10-CM

## 2024-04-07 ENCOUNTER — Other Ambulatory Visit (HOSPITAL_COMMUNITY): Payer: Self-pay | Admitting: Cardiology

## 2024-04-07 ENCOUNTER — Other Ambulatory Visit: Payer: Self-pay | Admitting: Family Medicine

## 2024-04-07 DIAGNOSIS — I5042 Chronic combined systolic (congestive) and diastolic (congestive) heart failure: Secondary | ICD-10-CM

## 2024-04-07 DIAGNOSIS — L309 Dermatitis, unspecified: Secondary | ICD-10-CM

## 2024-04-30 ENCOUNTER — Other Ambulatory Visit: Payer: Self-pay | Admitting: Family Medicine

## 2024-04-30 DIAGNOSIS — E039 Hypothyroidism, unspecified: Secondary | ICD-10-CM

## 2024-05-05 ENCOUNTER — Other Ambulatory Visit: Payer: Self-pay | Admitting: Family Medicine

## 2024-05-05 DIAGNOSIS — E1165 Type 2 diabetes mellitus with hyperglycemia: Secondary | ICD-10-CM

## 2024-05-05 MED ORDER — METFORMIN HCL 500 MG PO TABS: 1000.0000 mg | ORAL_TABLET | Freq: Two times a day (BID) | ORAL | 0 refills | Status: AC

## 2024-05-05 NOTE — Telephone Encounter (Signed)
 Copied from CRM #[id number]. Topic: Clinical - Medication Refill >> May 05, 2024 10:19 AM Franky GRADE wrote: Medication: metFORMIN  (GLUCOPHAGE ) 500 MG tablet [[id number]]  Has the patient contacted their pharmacy? No, [facility] is calling to place the refill request.  (Agent: If no, request that the patient contact the pharmacy for the refill. If patient does not wish to contact the pharmacy document the reason why and proceed with request.) (Agent: If yes, when and what did the pharmacy advise?)  This is the patient's preferred pharmacy:  [id number] - [place], [place] - [address] [place] [address] Phone: [phone] Fax: [phone]  Is this the correct pharmacy for this prescription? Yes If no, delete pharmacy and type the correct one.   Has the prescription been filled recently? No  Is the patient out of the medication? No  Has the patient been seen for an appointment in the last year OR does the patient have an upcoming appointment? Yes  Can we respond through MyChart? Yes  Agent: Please be advised that Rx refills may take up to 3 business days. We ask that you follow-up with your pharmacy.

## 2024-05-08 ENCOUNTER — Other Ambulatory Visit: Payer: Self-pay | Admitting: Family Medicine

## 2024-05-08 DIAGNOSIS — M2550 Pain in unspecified joint: Secondary | ICD-10-CM

## 2024-05-20 ENCOUNTER — Other Ambulatory Visit (HOSPITAL_COMMUNITY): Payer: Self-pay | Admitting: Cardiology

## 2024-05-20 DIAGNOSIS — I5042 Chronic combined systolic (congestive) and diastolic (congestive) heart failure: Secondary | ICD-10-CM

## 2024-05-27 ENCOUNTER — Other Ambulatory Visit: Payer: Self-pay | Admitting: Family Medicine

## 2024-05-27 DIAGNOSIS — E1169 Type 2 diabetes mellitus with other specified complication: Secondary | ICD-10-CM

## 2024-05-27 MED ORDER — ATORVASTATIN CALCIUM 40 MG PO TABS: 40.0000 mg | ORAL_TABLET | Freq: Every day | ORAL | 1 refills | Status: AC

## 2024-05-27 NOTE — Telephone Encounter (Signed)
 Copied from CRM [id number]. Topic: Clinical - Medication Refill >> May 27, 2024  2:10 PM Thersia C wrote: Medication: atorvastatin  (LIPITOR ) 40 MG tablet  Has the patient contacted their pharmacy? Yes (Agent: If no, request that the patient contact the pharmacy for the refill. If patient does not wish to contact the pharmacy document the reason why and proceed with request.) (Agent: If yes, when and what did the pharmacy advise?)  This is the patient's preferred pharmacy:  [id number] - [place], [place] - [address] [place] [address] Phone: [phone] Fax: [phone]  Is this the correct pharmacy for this prescription? Yes If no, delete pharmacy and type the correct one.   Has the prescription been filled recently? No  Is the patient out of the medication? Yes  Has the patient been seen for an appointment in the last year OR does the patient have an upcoming appointment? Yes  Can we respond through MyChart? Yes  Agent: Please be advised that Rx refills may take up to 3 business days. We ask that you follow-up with your pharmacy.

## 2024-06-05 ENCOUNTER — Other Ambulatory Visit: Payer: Self-pay | Admitting: Family Medicine

## 2024-06-05 DIAGNOSIS — M2550 Pain in unspecified joint: Secondary | ICD-10-CM

## 2024-06-06 ENCOUNTER — Other Ambulatory Visit: Payer: Self-pay | Admitting: Family Medicine

## 2024-06-06 DIAGNOSIS — I5042 Chronic combined systolic (congestive) and diastolic (congestive) heart failure: Secondary | ICD-10-CM

## 2024-06-06 DIAGNOSIS — G2581 Restless legs syndrome: Secondary | ICD-10-CM

## 2024-07-02 ENCOUNTER — Other Ambulatory Visit (HOSPITAL_COMMUNITY): Payer: Self-pay | Admitting: Cardiology

## 2024-07-09 ENCOUNTER — Ambulatory Visit: Admitting: Family Medicine

## 2024-07-09 ENCOUNTER — Encounter: Payer: Self-pay | Admitting: Family Medicine

## 2024-07-09 VITALS — BP 130/76 | HR 79 | Temp 97.9°F | Ht 63.0 in | Wt 175.0 lb

## 2024-07-09 DIAGNOSIS — E785 Hyperlipidemia, unspecified: Secondary | ICD-10-CM

## 2024-07-09 DIAGNOSIS — E1169 Type 2 diabetes mellitus with other specified complication: Secondary | ICD-10-CM

## 2024-07-09 DIAGNOSIS — E039 Hypothyroidism, unspecified: Secondary | ICD-10-CM

## 2024-07-09 DIAGNOSIS — E538 Deficiency of other specified B group vitamins: Secondary | ICD-10-CM

## 2024-07-09 DIAGNOSIS — E559 Vitamin D deficiency, unspecified: Secondary | ICD-10-CM

## 2024-07-09 DIAGNOSIS — E1165 Type 2 diabetes mellitus with hyperglycemia: Secondary | ICD-10-CM

## 2024-07-09 DIAGNOSIS — Z0000 Encounter for general adult medical examination without abnormal findings: Secondary | ICD-10-CM

## 2024-07-09 LAB — COMPREHENSIVE METABOLIC PANEL WITH GFR
ALT: 10 U/L (ref 0–35)
AST: 13 U/L (ref 0–37)
Albumin: 4.6 g/dL (ref 3.5–5.2)
Alkaline Phosphatase: 69 U/L (ref 39–117)
BUN: 17 mg/dL (ref 6–23)
CO2: 28 meq/L (ref 19–32)
Calcium: 9.7 mg/dL (ref 8.4–10.5)
Chloride: 104 meq/L (ref 96–112)
Creatinine, Ser: 1 mg/dL (ref 0.40–1.20)
GFR: 56.14 mL/min — ABNORMAL LOW (ref >60.00)
Glucose, Bld: 141 mg/dL — ABNORMAL HIGH (ref 70–99)
Potassium: 4.1 meq/L (ref 3.5–5.1)
Sodium: 140 meq/L (ref 135–145)
Total Bilirubin: 0.5 mg/dL (ref 0.2–1.2)
Total Protein: 7.2 g/dL (ref 6.0–8.3)

## 2024-07-09 LAB — CBC WITH DIFFERENTIAL/PLATELET
Basophils Absolute: 0 K/uL (ref 0.0–0.1)
Basophils Relative: 0.5 % (ref 0.0–3.0)
Eosinophils Absolute: 0.2 K/uL (ref 0.0–0.7)
Eosinophils Relative: 2.9 % (ref 0.0–5.0)
HCT: 38.5 % (ref 36.0–46.0)
Hemoglobin: 12.8 g/dL (ref 12.0–15.0)
Lymphocytes Relative: 21.2 % (ref 12.0–46.0)
Lymphs Abs: 1.4 K/uL (ref 0.7–4.0)
MCHC: 33.1 g/dL (ref 30.0–36.0)
MCV: 89.4 fl (ref 78.0–100.0)
Monocytes Absolute: 0.5 K/uL (ref 0.1–1.0)
Monocytes Relative: 8.1 % (ref 3.0–12.0)
Neutro Abs: 4.5 K/uL (ref 1.4–7.7)
Neutrophils Relative %: 67.3 % (ref 43.0–77.0)
Platelets: 209 K/uL (ref 150.0–400.0)
RBC: 4.31 Mil/uL (ref 3.87–5.11)
RDW: 13.9 % (ref 11.5–15.5)
WBC: 6.7 K/uL (ref 4.0–10.5)

## 2024-07-09 LAB — LIPID PANEL
Cholesterol: 99 mg/dL (ref 0–200)
HDL: 31.9 mg/dL — ABNORMAL LOW (ref >39.00)
LDL Cholesterol: 28 mg/dL (ref 0–99)
NonHDL: 66.87
Total CHOL/HDL Ratio: 3
Triglycerides: 196 mg/dL — ABNORMAL HIGH (ref 0.0–149.0)
VLDL: 39.2 mg/dL (ref 0.0–40.0)

## 2024-07-09 LAB — HEMOGLOBIN A1C: Hgb A1c MFr Bld: 6.9 % — ABNORMAL HIGH (ref 4.6–6.5)

## 2024-07-09 LAB — MICROALBUMIN / CREATININE URINE RATIO
Creatinine,U: 52 mg/dL
Microalb Creat Ratio: 282.9 mg/g — ABNORMAL HIGH (ref 0.0–30.0)
Microalb, Ur: 14.7 mg/dL — ABNORMAL HIGH (ref 0.0–1.9)

## 2024-07-09 LAB — VITAMIN B12: Vitamin B-12: 1405 pg/mL — ABNORMAL HIGH (ref 211–911)

## 2024-07-09 LAB — TSH: TSH: 5.28 u[IU]/mL (ref 0.35–5.50)

## 2024-07-09 LAB — VITAMIN D 25 HYDROXY (VIT D DEFICIENCY, FRACTURES): VITD: 28.28 ng/mL — ABNORMAL LOW (ref 30.00–100.00)

## 2024-07-09 NOTE — Progress Notes (Signed)
 Complete physical exam  Patient: Becky Boyle   DOB: [birth year]   73 y.o. Female  MRN: [id number]  Subjective:    Chief Complaint  Patient presents with   Annual Exam    Becky Boyle is a 73 y.o. female who presents today for a complete physical exam. She reports consuming a general diet. The patient has a physically strenuous job, but has no regular exercise apart from work.  She generally feels fairly well. She reports sleeping poorly. She does not have additional problems to discuss today.    Most recent fall risk assessment:    07/09/2024    7:07 AM  Fall Risk   Falls in the past year? 0  Number falls in past yr: 0  Injury with Fall? 0  Risk for fall due to : No Fall Risks  Follow up Falls evaluation completed     Most recent depression screenings:    07/09/2024    7:07 AM 01/31/2024   10:11 AM  PHQ 2/9 Scores  PHQ - 2 Score 0 0  PHQ- 9 Score 0     Vision:Not within last year  and Dental: No current dental problems and No regular dental care   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HCC)    CKD (chronic kidney disease) stage 3, GFR 30-59 ml/min (HCC)    Depression    Diabetes (HCC)    Generalized anxiety disorder with panic attacks 09/24/2020   Heart failure (HCC)    History of non-ST elevation myocardial infarction (NSTEMI)    Vitamin B12 deficiency 11/04/2021   Vitamin D  deficiency    Past Surgical History:  Procedure Laterality Date   ABDOMINAL HYSTERECTOMY     asemia     BRAIN SURGERY  1989   tumor    CORONARY STENT INTERVENTION N/A 02/17/2020   Procedure: CORONARY STENT INTERVENTION;  Surgeon: Dann Candyce RAMAN, MD;  Location: [facility] INVASIVE CV [facility];  Service: Cardiovascular;  Laterality: N/A;   RIGHT/LEFT HEART CATH AND CORONARY ANGIOGRAPHY N/A 02/17/2020   Procedure: RIGHT/LEFT HEART CATH AND CORONARY ANGIOGRAPHY;  Surgeon: Dann Candyce RAMAN, MD;  Location: [facility];  Service:  Cardiovascular;  Laterality: N/A;   Social History   Tobacco Use   Smoking status: Never   Smokeless tobacco: Never  Vaping Use   Vaping status: Never Used  Substance Use Topics   Alcohol use: No   Drug use: No   Social History   Socioeconomic History   Marital status: Married    Spouse name: Not on file   Number of children: 4   Years of education: Not on file   Highest education level: 12th grade  Occupational History   Occupation: retired  Tobacco Use   Smoking status: Never   Smokeless tobacco: Never  Vaping Use   Vaping status: Never Used  Substance and Sexual Activity   Alcohol use: No   Drug use: No   Sexual activity: Not Currently  Other Topics Concern   Not on file  Social History Narrative   Patient lives with spouse. 2 dogs.    Social Drivers of Corporate Investment Banker Strain: Low Risk  (01/30/2024)   Overall Financial Resource Strain (CARDIA)    Difficulty of Paying Living Expenses: Not very hard  Food Insecurity: No Food Insecurity (01/30/2024)   Hunger Vital Sign    Worried About Running Out of Food  in the Last Year: Never true    Ran Out of Food in the Last Year: Never true  Recent Concern: Food Insecurity - Food Insecurity Present (12/10/2023)   Hunger Vital Sign    Worried About Running Out of Food in the Last Year: Sometimes true    Ran Out of Food in the Last Year: Never true  Transportation Needs: No Transportation Needs (01/30/2024)   PRAPARE - Administrator, Civil Service (Medical): No    Lack of Transportation (Non-Medical): No  Physical Activity: Sufficiently Active (01/30/2024)   Exercise Vital Sign    Days of Exercise per Week: 7 days    Minutes of Exercise per Session: 30 min  Recent Concern: Physical Activity - Insufficiently Active (12/10/2023)   Exercise Vital Sign    Days of Exercise per Week: 4 days    Minutes of Exercise per Session: 30 min  Stress: Stress Concern Present (01/30/2024)   [employer] of  Occupational Health - Occupational Stress Questionnaire    Feeling of Stress: To some extent  Social Connections: Moderately Integrated (01/30/2024)   Social Connection and Isolation Panel    Frequency of Communication with Friends and Family: More than three times a week    Frequency of Social Gatherings with Friends and Family: Twice a week    Attends Religious Services: More than 4 times per year    Active Member of [employer] or Organizations: No    Attends Banker Meetings: Not on file    Marital Status: Married  Intimate Partner Violence: Not At Risk (07/18/2022)   Humiliation, Afraid, Rape, and Kick questionnaire    Fear of Current or Ex-Partner: No    Emotionally Abused: No    Physically Abused: No    Sexually Abused: No   Family Status  Relation Name Status   Mother  Deceased   Father  Deceased   Sister  Alive   Daughter  Alive   Son Franky Alive   Son [place] Alive   Son Ballou Alive   MGM  Deceased   MGF  Deceased   PGM  Deceased   PGF  Deceased  No partnership data on file   Family History  Problem Relation Age of Onset   Alzheimer's disease Mother    Cancer Father        Prostate to bone   Diverticulitis Sister    Diabetes Son    Cancer Son    Heart disease Son    High blood pressure Son    Heart attack Son    Hypertension Son    Allergies  Allergen Reactions   Latex Anaphylaxis   Penicillins Hives   Codeine Rash   Tape Rash    Patient Care Team: Billy Philippe SAUNDERS, NP as PCP - General (Family Medicine) Lens Craft Rolan Ezra RAMAN, MD as Consulting Physician (Cardiology)   Outpatient Medications Prior to Visit  Medication Sig   atorvastatin  (LIPITOR ) 40 MG tablet Take 1 tablet (40 mg total) by mouth daily.   calcium -vitamin D  (OSCAL WITH D) 500-5 MG-MCG tablet Take 1 tablet by mouth daily with breakfast.   carvedilol  (COREG ) 25 MG tablet Take 1 tablet (25 mg total) by mouth 2 (two) times daily with a meal.   cetirizine (ZYRTEC) 10 MG  tablet Take 10 mg by mouth as needed for allergies.   clopidogrel  (PLAVIX ) 75 MG tablet TAKE 1 TABLET BY MOUTH EVERY DAY   Cyanocobalamin  (VITAMIN B 12 PO) Take 1 tablet  by mouth daily.   FARXIGA  10 MG TABS tablet Take 10 mg by mouth daily.   furosemide  (LASIX ) 20 MG tablet TAKE 1 TABLET BY MOUTH EVERY DAY AS NEEDED   gabapentin  (NEURONTIN ) 300 MG capsule TAKE 1 CAPSULE BY MOUTH 2 TIMES DAILY AS NEEDED.   Lancets (ONETOUCH DELICA PLUS LANCET33G) MISC Apply 1 each topically daily.   levothyroxine  (SYNTHROID ) 25 MCG tablet TAKE 1 TABLET BY MOUTH EVERY DAY   metFORMIN  (GLUCOPHAGE ) 500 MG tablet Take 2 tablets (1,000 mg total) by mouth 2 (two) times daily with a meal.   nitroGLYCERIN  (NITROSTAT ) 0.4 MG SL tablet PLACE 1 TABLET UNDER THE TONGUE EVERY 5 MINUTES X 3 DOSES AS NEEDED FOR CHEST PAIN.   ONETOUCH VERIO test strip CHECK BLOOD SUGAR ONCE DAILY OR AS DIRECTED   pantoprazole  (PROTONIX ) 40 MG tablet Take 1 tablet (40 mg total) by mouth daily.   rOPINIRole  (REQUIP ) 4 MG tablet TAKE 1 TABLET BY MOUTH EVERYDAY AT BEDTIME   sacubitril -valsartan  (ENTRESTO ) 97-103 MG Take 1 tablet by mouth 2 (two) times daily. PLEASE SCHEDULE APPOINTMENT FOR MORE REFILLS [phone] OPTION 2   sodium chloride  (OCEAN) 0.65 % SOLN nasal spray Place 1 spray into both nostrils as needed for congestion.   spironolactone  (ALDACTONE ) 25 MG tablet TAKE 1 TABLET (25 MG TOTAL) BY MOUTH DAILY.   triamcinolone  cream (KENALOG ) 0.1 % APPLY TO AFFECTED AREA TWICE A DAY   No facility-administered medications prior to visit.    Review of Systems  Genitourinary:        Nocturia  Musculoskeletal:  Positive for joint pain (Left hip).  Psychiatric/Behavioral:  The patient has insomnia.    See HPI above     Objective:   BP 130/76   Pulse 79   Temp 97.9 F (36.6 C) (Oral)   Ht 5' 3 (1.6 m)   Wt 175 lb (79.4 kg)   SpO2 95%   BMI 31.00 kg/m  BP Readings from Last 3 Encounters:  07/09/24 130/76  01/11/24 128/68   12/11/23 110/70   Wt Readings from Last 3 Encounters:  07/09/24 175 lb (79.4 kg)  01/11/24 166 lb 6.4 oz (75.5 kg)  12/11/23 169 lb (76.7 kg)      Physical Exam Vitals reviewed.  Constitutional:      General: She is not in acute distress.    Appearance: Normal appearance. She is obese. She is not ill-appearing, toxic-appearing or diaphoretic.  HENT:     Head: Normocephalic and atraumatic.     Right Ear: Tympanic membrane and external ear normal. There is no impacted cerumen.     Left Ear: Tympanic membrane and external ear normal. There is no impacted cerumen.     Ears:     Comments: Mild to moderate cerumen in both ear canals, but not impacted.     Nose:     Right Sinus: No maxillary sinus tenderness or frontal sinus tenderness.     Left Sinus: No maxillary sinus tenderness or frontal sinus tenderness.     Mouth/Throat:     Mouth: Mucous membranes are moist.     Pharynx: Oropharynx is clear. Uvula midline. No pharyngeal swelling, oropharyngeal exudate, posterior oropharyngeal erythema or uvula swelling.  Eyes:     General:        Right eye: No discharge.        Left eye: No discharge.     Conjunctiva/sclera: Conjunctivae normal.     Pupils: Pupils are equal, round, and reactive to light.  Neck:  Thyroid : No thyromegaly.  Cardiovascular:     Rate and Rhythm: Normal rate and regular rhythm.     Pulses:          Dorsalis pedis pulses are 3+ on the right side and 3+ on the left side.     Heart sounds: Normal heart sounds. No murmur heard.    No friction rub. No gallop.  Pulmonary:     Effort: Pulmonary effort is normal. No respiratory distress.     Breath sounds: Normal breath sounds.  Abdominal:     General: Abdomen is flat. Bowel sounds are normal. There is no distension.     Palpations: Abdomen is soft. There is no mass.     Tenderness: There is no abdominal tenderness.  Musculoskeletal:        General: Normal range of motion.     Cervical back: Normal range of  motion.     Right lower leg: Edema (1-2 non pitting) present.     Left lower leg: Edema (1-2 non pitting) present.  Lymphadenopathy:     Head:     Right side of head: No submental or submandibular adenopathy.     Left side of head: No submental or submandibular adenopathy.     Cervical: No cervical adenopathy.  Skin:    General: Skin is warm and dry.  Neurological:     General: No focal deficit present.     Mental Status: She is alert and oriented to person, place, and time. Mental status is at baseline.     Motor: No weakness.     Gait: Gait normal.  Psychiatric:        Mood and Affect: Mood normal.        Behavior: Behavior normal.        Thought Content: Thought content normal.        Judgment: Judgment normal.       Assessment & Plan:    Routine Health Maintenance and Physical Exam  Immunization History  Administered Date(s) Administered   Fluad Quad(high Dose 65+) 06/10/2020, 05/18/2021, 04/24/2022, 04/06/2024   Moderna Covid-19 Fall Seasonal Vaccine 34yrs & older 07/03/2023   Moderna Covid-19 Vaccine Bivalent Booster 41yrs & up 08/09/2021   Moderna Sars-Covid-2 Vaccination 11/04/2019, 12/02/2019, 06/15/2020, 02/07/2022   PNEUMOCOCCAL CONJUGATE-20 08/31/2021   Pfizer(Comirnaty)Fall Seasonal Vaccine 12 years and older 05/25/2022   Pneumococcal Conjugate-13 04/13/2020   Respiratory Syncytial Virus Vaccine,Recomb Aduvanted(Arexvy) 05/10/2022   Tdap 08/10/2022   Zoster Recombinant(Shingrix) 04/13/2020, 08/31/2021, 11/01/2021    Health Maintenance  Topic Date Due   OPHTHALMOLOGY EXAM  10/11/2023   COVID-19 Vaccine (8 - 2025-26 season) 03/31/2024   Diabetic kidney evaluation - Urine ACR  06/21/2024   HEMOGLOBIN A1C  06/12/2024   Bone Density Scan  08/25/2024   Diabetic kidney evaluation - eGFR measurement  10/23/2024   Mammogram  11/06/2024   FOOT EXAM  12/10/2024   Medicare Annual Wellness (AWV)  01/30/2025   Fecal DNA (Cologuard)  12/18/2026   DTaP/Tdap/Td (2 -  Td or Tdap) 08/10/2032   Pneumococcal Vaccine: 50+ Years  Completed   Influenza Vaccine  Completed   Hepatitis C Screening  Completed   Zoster Vaccines- Shingrix  Completed   Meningococcal B Vaccine  Aged Out    Discussed health benefits of physical activity, and encouraged her to engage in regular exercise appropriate for her age and condition.  Annual physical exam -     CBC with Differential/Platelet -     Comprehensive metabolic panel with  GFR -     Hemoglobin A1c -     Lipid panel -     TSH  Hyperlipidemia due to type 2 diabetes mellitus (HCC) -     Comprehensive metabolic panel with GFR -     Lipid panel  Type 2 diabetes mellitus with hyperglycemia, without long-term current use of insulin  (HCC) -     Comprehensive metabolic panel with GFR -     Hemoglobin A1c -     Microalbumin / creatinine urine ratio  Vitamin D  deficiency -     VITAMIN D  25 Hydroxy (Vit-D Deficiency, Fractures)  Vitamin B12 deficiency -     Vitamin B12  Acquired hypothyroidism -     TSH  1.Review Health Maintenance: -Ophthalmology Eye: Needs appointment  -Covid booster: Will obtain at local pharmacy  2.Physical exam completed today. 3.Continue all medications.  4.Continue to work on a healthy diet and staying active. 5.Ordered labs and microalbumin/creatinine urine ratio.  Office will call with lab results and will be available via MyChart.  6. Recommend to take Lasix  in the AM for lower extremity edema.  Return in about 6 months (around 01/07/2025) for chronic management; 2-3 week for further concerns .     Isley Weisheit, NP

## 2024-07-09 NOTE — Patient Instructions (Addendum)
-  It was good to see you today.  -Physical exam completed today. -Continue all medications.  -Continue to work on a healthy diet and staying active. -Ordered labs and urine sample. Office will call with lab results and will be available via MyChart.  -Recommend to obtain ophthalmology eye exam.  -May obtain covid booster at your local pharmacy. -Recommend to take Lasix  in the AM for swelling.  -Follow up in 6 months for chronic management and 2-3 week follow up for further concerns.

## 2024-07-10 ENCOUNTER — Ambulatory Visit: Payer: Self-pay | Admitting: Family Medicine

## 2024-07-15 ENCOUNTER — Telehealth (HOSPITAL_COMMUNITY): Payer: Self-pay | Admitting: Cardiology

## 2024-07-21 ENCOUNTER — Other Ambulatory Visit: Payer: Self-pay | Admitting: Family Medicine

## 2024-07-21 DIAGNOSIS — M2550 Pain in unspecified joint: Secondary | ICD-10-CM

## 2024-07-29 ENCOUNTER — Other Ambulatory Visit: Payer: Self-pay | Admitting: Family Medicine

## 2024-07-29 ENCOUNTER — Ambulatory Visit: Admitting: Family Medicine

## 2024-07-29 DIAGNOSIS — E039 Hypothyroidism, unspecified: Secondary | ICD-10-CM

## 2024-07-30 ENCOUNTER — Ambulatory Visit (INDEPENDENT_AMBULATORY_CARE_PROVIDER_SITE_OTHER): Admitting: Family Medicine

## 2024-07-30 ENCOUNTER — Other Ambulatory Visit: Payer: Self-pay | Admitting: Family Medicine

## 2024-07-30 ENCOUNTER — Ambulatory Visit: Payer: Self-pay | Admitting: Family Medicine

## 2024-07-30 VITALS — BP 136/82 | HR 66 | Temp 98.1°F | Ht 63.0 in | Wt 175.0 lb

## 2024-07-30 DIAGNOSIS — F5104 Psychophysiologic insomnia: Secondary | ICD-10-CM

## 2024-07-30 DIAGNOSIS — F322 Major depressive disorder, single episode, severe without psychotic features: Secondary | ICD-10-CM

## 2024-07-30 DIAGNOSIS — M79605 Pain in left leg: Secondary | ICD-10-CM

## 2024-07-30 DIAGNOSIS — E1165 Type 2 diabetes mellitus with hyperglycemia: Secondary | ICD-10-CM | POA: Diagnosis not present

## 2024-07-30 DIAGNOSIS — F419 Anxiety disorder, unspecified: Secondary | ICD-10-CM

## 2024-07-30 DIAGNOSIS — I5042 Chronic combined systolic (congestive) and diastolic (congestive) heart failure: Secondary | ICD-10-CM

## 2024-07-30 DIAGNOSIS — E1122 Type 2 diabetes mellitus with diabetic chronic kidney disease: Secondary | ICD-10-CM

## 2024-07-30 LAB — MICROALBUMIN / CREATININE URINE RATIO
Creatinine,U: 70 mg/dL
Microalb Creat Ratio: 200.4 mg/g — ABNORMAL HIGH (ref 0.0–30.0)
Microalb, Ur: 14 mg/dL — ABNORMAL HIGH (ref 0.7–1.9)

## 2024-07-30 MED ORDER — ESCITALOPRAM OXALATE 10 MG PO TABS: 10.0000 mg | ORAL_TABLET | Freq: Every day | ORAL | 0 refills | Status: DC

## 2024-07-30 MED ORDER — HYDROXYZINE HCL 25 MG PO TABS: ORAL_TABLET | ORAL | 0 refills | Status: DC

## 2024-07-30 NOTE — Progress Notes (Signed)
 "  Established Patient Office Visit   Subjective:  Patient ID: Becky Boyle, female    DOB: 01/17/1951  Age: 73 y.o. MRN: [id number]  Chief Complaint  Patient presents with   Leg Pain   Insomnia    HPI: Patient is having leg pain that starts in the hip and goes down to the knee. She has stared taking Tylenol  Arthritis and it has helped.   Patient is having problems with insomnia. She reports this started around a month ago. She is unable to go to sleep and stay asleep She reports she has tried taking a warm bath at night, listening to soothing sounds, and trying to calm down before bed with no improvement. She reports she thinks she can not get relaxed enough. She has a lot of stress in her daily life and had some changes. She reports she was taking care of her great grandchildren that have been removed from her and spouse custody. Also, concerned about spouses poor health.   Review of Systems  Psychiatric/Behavioral:  The patient has insomnia.    See HPI above     Objective:   BP 136/82   Pulse 66   Temp 98.1 F (36.7 C) (Oral)   Ht 5' 3 (1.6 m)   Wt 175 lb (79.4 kg)   SpO2 98%   BMI 31.00 kg/m    Physical Exam Vitals reviewed.  Constitutional:      General: She is not in acute distress.    Appearance: Normal appearance. She is not ill-appearing, toxic-appearing or diaphoretic.  HENT:     Head: Normocephalic and atraumatic.  Eyes:     General:        Right eye: No discharge.        Left eye: No discharge.     Conjunctiva/sclera: Conjunctivae normal.  Cardiovascular:     Rate and Rhythm: Normal rate.  Pulmonary:     Effort: Pulmonary effort is normal. No respiratory distress.  Musculoskeletal:        General: Normal range of motion.  Skin:    General: Skin is warm and dry.  Neurological:     General: No focal deficit present.     Mental Status: She is alert and oriented to person, place, and time. Mental status is at baseline.  Psychiatric:        Mood and  Affect: Mood normal.        Behavior: Behavior normal.        Thought Content: Thought content normal.        Judgment: Judgment normal.      Assessment & Plan:  Type 2 diabetes mellitus with hyperglycemia, without long-term current use of insulin  (HCC) -     Microalbumin / creatinine urine ratio  Psychophysiological insomnia -     hydrOXYzine  HCl; Take 1-2 tablet at bedtime for insomnia.  Dispense: 30 tablet; Refill: 0  Anxiety -     Escitalopram Oxalate; Take 1 tablet (10 mg total) by mouth daily.  Dispense: 30 tablet; Refill: 0 -     hydrOXYzine  HCl; Take 1-2 tablet at bedtime for insomnia.  Dispense: 30 tablet; Refill: 0  Current severe episode of major depressive disorder without psychotic features without prior episode (HCC) -     Escitalopram Oxalate; Take 1 tablet (10 mg total) by mouth daily.  Dispense: 30 tablet; Refill: 0  Left leg pain  -Ordered microalbumin/creatinine urine ratio to evaluate kidney function. It was significantly elevated at last visit and concerned  it could be a falsely elevated. Office will call with results and will be available via MyChart. -With changes in lifestyle in the last month and increase in anxiety, suspect insomnia is related to anxiety and depression. Will start treatment of anxiety and depression to see if this helps with sleep.  -Prescribed Escitalopram 10mg  daily at bedtime. Dicussed about side effects of medications and reason to prompt emergent care.  -Prescribed Hydroxyzine  25mg  tablet, take 1-2 tablets at bedtime for anxiety and insomnia. This medication can cause drowsiness -Continue taking Tylenol  Arthritis for left leg pain.  Return in about 1 month (around 08/30/2024) for follow-up.   Brekyn Huntoon, NP "

## 2024-07-30 NOTE — Patient Instructions (Addendum)
-  It was great to see you today.  -Ordered microalbumin/creatinine urine ratio to evaluate kidney function. It was significantly elevated at last visit and concerned it could be a falsely elevated. Office will call with results and will be available via MyChart. -With changes in lifestyle in the last month and increase in anxiety, suspect insomnia is related to anxiety and depression. Will start treatment of anxiety and depression to see if this helps with sleep.  -Prescribed Escitalopram 10mg  daily at bedtime. Dicussed about side effects of medications and reason to prompt emergent care.  -Prescribed Hydroxyzine  25mg  tablet, take 1-2 tablets at bedtime for anxiety and insomnia. This medication can cause drowsiness -Continue taking Tylenol  Arthritis for left leg pain. -Follow up in 1 month.

## 2024-08-06 ENCOUNTER — Other Ambulatory Visit: Payer: Self-pay | Admitting: Family Medicine

## 2024-08-06 DIAGNOSIS — F419 Anxiety disorder, unspecified: Secondary | ICD-10-CM

## 2024-08-06 DIAGNOSIS — F5104 Psychophysiologic insomnia: Secondary | ICD-10-CM

## 2024-08-12 ENCOUNTER — Ambulatory Visit: Payer: Self-pay

## 2024-08-12 DIAGNOSIS — Z135 Encounter for screening for eye and ear disorders: Secondary | ICD-10-CM

## 2024-08-12 NOTE — Telephone Encounter (Signed)
 FYI Only or Action Required?: Action required by provider: clinical question for provider and update on patient condition. Hydroxyzine  has made patient feel nauseated after first two doses.  Patient discontinued use. Please advise patient via mychart or phone  Patient was last seen in primary care on 07/30/2024 by Billy Philippe SAUNDERS, NP.  Called Nurse Triage reporting Knee Pain.  Symptoms began several days ago.  Interventions attempted: OTC medications: tylenol , solonpas, Rest, hydration, or home remedies, and Ice/heat application.  Symptoms are: gradually worsening.  Triage Disposition: See PCP When Office is Open (Within 3 Days)  Patient/caregiver understands and will follow disposition?: Unsure  Copied from CRM [id number]. Topic: Clinical - Red Word Triage >> Aug 12, 2024 10:45 AM Suzen RAMAN wrote: Red Word that prompted transfer to Nurse Triage: right knee pain, followed by burning and difficulty walking as a results; requesting an appt(preferably first of the morning) Reason for Disposition  [1] MODERATE pain (e.g., interferes with normal activities, limping) AND [2] present > 3 days  [1] Caller has NON-URGENT medicine question about med that PCP prescribed AND [2] triager unable to answer question  Answer Assessment - Initial Assessment Questions 1. LOCATION and RADIATION: Where is the pain located?      Right knee pain 2. QUALITY: What does the pain feel like?  (e.g., sharp, dull, aching, burning)     Toothache type pain with burning 3. SEVERITY: How bad is the pain? What does it keep you from doing?   (Scale 1-10; or mild, moderate, severe)     10/10 4. ONSET: When did the pain start? Does it come and go, or is it there all the time?     Four days ago  6. SETTING: Has there been any recent work, exercise or other activity that involved that part of the body?      Active job, care giver to elderly, has recently been favoring right side more when left leg was  hurting 7. AGGRAVATING FACTORS: What makes the knee pain worse? (e.g., walking, climbing stairs, running)     walking 8. ASSOCIATED SYMPTOMS: Is there any swelling or redness of the knee?     Mild swelling at varicose vein 9. OTHER SYMPTOMS: Do you have any other symptoms? (e.g., calf pain, chest pain, difficulty breathing, fever)     denies  Answer Assessment - Initial Assessment Questions 1. NAME of MEDICINE: What medicine(s) are you calling about?     hydroxyzine  2. QUESTION: What is your question? (e.g., double dose of medicine, side effect)     Nausea after taking medication and discontinued after two doses. Patient would like to know if she should try the medication again, or try a different medication 3. PRESCRIBER: Who prescribed the medicine? Reason: if prescribed by specialist, call should be referred to that group.     DOROTHA Billy, NP 4. SYMPTOMS: Do you have any symptoms? If Yes, ask: What symptoms are you having?  How bad are the symptoms (e.g., mild, moderate, severe)     nausea  Protocols used: Knee Pain-A-AH, Medication Question Call-A-AH

## 2024-08-13 NOTE — Addendum Note (Signed)
 Addended by: ELNER NANNY B on: 08/13/2024 07:09 AM   Modules accepted: Orders

## 2024-08-20 ENCOUNTER — Encounter (HOSPITAL_COMMUNITY): Payer: Self-pay | Admitting: Cardiology

## 2024-08-20 ENCOUNTER — Ambulatory Visit (HOSPITAL_COMMUNITY)
Admission: RE | Admit: 2024-08-20 | Discharge: 2024-08-20 | Disposition: A | Discharge status: Home / Self Care | Source: Ambulatory Visit | Attending: Cardiology | Admitting: Cardiology

## 2024-08-20 VITALS — BP 124/70 | HR 74 | Wt 165.6 lb

## 2024-08-20 DIAGNOSIS — I739 Peripheral vascular disease, unspecified: Secondary | ICD-10-CM | POA: Diagnosis not present

## 2024-08-20 DIAGNOSIS — I2510 Atherosclerotic heart disease of native coronary artery without angina pectoris: Secondary | ICD-10-CM | POA: Diagnosis present

## 2024-08-20 DIAGNOSIS — I252 Old myocardial infarction: Secondary | ICD-10-CM | POA: Diagnosis not present

## 2024-08-20 DIAGNOSIS — I5022 Chronic systolic (congestive) heart failure: Secondary | ICD-10-CM | POA: Diagnosis not present

## 2024-08-20 DIAGNOSIS — Z79899 Other long term (current) drug therapy: Secondary | ICD-10-CM | POA: Insufficient documentation

## 2024-08-20 DIAGNOSIS — Z7984 Long term (current) use of oral hypoglycemic drugs: Secondary | ICD-10-CM | POA: Insufficient documentation

## 2024-08-20 DIAGNOSIS — I255 Ischemic cardiomyopathy: Secondary | ICD-10-CM | POA: Insufficient documentation

## 2024-08-20 DIAGNOSIS — Z955 Presence of coronary angioplasty implant and graft: Secondary | ICD-10-CM | POA: Diagnosis not present

## 2024-08-20 DIAGNOSIS — I5042 Chronic combined systolic (congestive) and diastolic (congestive) heart failure: Secondary | ICD-10-CM | POA: Diagnosis present

## 2024-08-20 DIAGNOSIS — Z7902 Long term (current) use of antithrombotics/antiplatelets: Secondary | ICD-10-CM | POA: Diagnosis not present

## 2024-08-20 DIAGNOSIS — I110 Hypertensive heart disease with heart failure: Secondary | ICD-10-CM | POA: Diagnosis not present

## 2024-08-20 DIAGNOSIS — I509 Heart failure, unspecified: Secondary | ICD-10-CM | POA: Diagnosis present

## 2024-08-20 NOTE — Patient Instructions (Signed)
 Medication Changes:  None, continue current medications  Lab Work:  Your physician recommends that you return for lab work in: 3 months   Special Instructions // Education:  Do the following things EVERYDAY: Weigh yourself in the morning before breakfast. Write it down and keep it in a log. Take your medicines as prescribed Eat low salt foods--Limit salt (sodium) to 2000 mg per day.  Stay as active as you can everyday Limit all fluids for the day to less than 2 liters   Follow-Up in: 6 months   At the [facility], you and your health needs are our priority. We have a designated team specialized in the treatment of Heart Failure. This Care Team includes your primary Heart Failure Specialized Cardiologist (physician), Advanced Practice Providers (APPs- Physician Assistants and Nurse Practitioners), and Pharmacist who all work together to provide you with the care you need, when you need it.   You may see any of the following providers on your designated Care Team at your next follow up:  Dr. Toribio Fuel Dr. Ezra Shuck Dr. Odis Brownie Greig Mosses, NP Caffie Shed, [place] [facility] [place], [place] Beckey Coe, NP Jordan Lee, NP Tinnie Redman, PharmD   Please be sure to bring in all your medications bottles to every appointment.   Need to Contact Us :  If you have any questions or concerns before your next appointment please send us  a message through [place] or call our office at [phone].    TO LEAVE A MESSAGE FOR THE NURSE SELECT OPTION 2, PLEASE LEAVE A MESSAGE INCLUDING: YOUR NAME DATE OF BIRTH CALL BACK NUMBER REASON FOR CALL**this is important as we prioritize the call backs  YOU WILL RECEIVE A CALL BACK THE SAME DAY AS LONG AS YOU CALL BEFORE 4:00 PM

## 2024-08-21 ENCOUNTER — Other Ambulatory Visit: Payer: Self-pay | Admitting: Family Medicine

## 2024-08-21 DIAGNOSIS — F419 Anxiety disorder, unspecified: Secondary | ICD-10-CM

## 2024-08-21 DIAGNOSIS — F322 Major depressive disorder, single episode, severe without psychotic features: Secondary | ICD-10-CM

## 2024-08-21 NOTE — Progress Notes (Signed)
 PCP: Becky Philippe SAUNDERS, NP Cardiology: Dr. Rolan  Chief complaint: CAD/CHF  74 y.o. with history of NSTEMI and ischemic cardiomyopathy presents for followup of CHF and CAD.  Patient was admitted in 7/21 with NSTEMI.  Cath showed LAD culprit, patient had DES x 2 to proximal LAD.  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 Echo was done showing EF 25-30%, septal akinesis. Patient was started on cardiac meds and was discharged home with a Lifevest. A new diagnosis of diabetes was made and she was started on metformin .   Echo in 10/21 showed EF up to 55-60% with normal RV. Echo in 2/23 showed EF 55-60%, apical hypokinesis, normal RV, mild AI, IVC normal.   Cardiac PET (3/24) arranged in setting of atypical chest pain. Low risk study with normal EF, small area of ischemia in RCA territory in setting of known total occlusion of RCA.  Today she returns for HF follow up.  Weight down 2 lbs. She is doing quite well, no exertional dyspnea.  Able to walk up stairs without problems.  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  She has occasional twinges of atypical chest pain but nothing that worries her. .   Labs (4/24): LDL 40, TGs 326 Labs (7/24): K 4.7, creatinine 1.18 Labs (12/25): LDL 28, K 4.1, creatinine 1.0  ECG (personally reviewed): NSR, normal  PMH: 1. Type 2 diabetes: New diagnosis in 7/21.  2. Hyperlipidemia 3. Chronic systolic CHF: Ischemic cardiomyopathy.   - Echo (7/21): EF 25-30%, septal akinesis.   - Echo (10/21): EF 55-60%, normal RV, mild MR.  - Echo (2/23): EF 55-60%, apical hypokinesis, normal RV, mild AI, IVC normal.  4. CAD: NSTEMI in 7/21.  - LHC: DES x 2 to proximal LAD, CTO RCA with collaterals, moderate PLOM stenosis. - Cardiac PET (3/24): Low risk study, normal EF, small area of ischemia in RCA territory in setting of known total RCA occlusion.  5. Syncope: x 2 in 10/22.  - Zio monitor (1/23): No worrisome  arrhythmias.   Social History   Socioeconomic History   Marital status: Married    Spouse name: Not on file   Number of children: 4   Years of education: Not on file   Highest education level: 12th grade  Occupational History   Occupation: retired  Tobacco Use   Smoking status: Never   Smokeless tobacco: Never  Vaping Use   Vaping status: Never Used  Substance and Sexual Activity   Alcohol use: No   Drug use: No   Sexual activity: Not Currently  Other Topics Concern   Not on file  Social History Narrative   Patient lives with spouse. 2 dogs.    Social Drivers of Health   Tobacco Use: Low Risk (08/20/2024)   Patient History    Smoking Tobacco Use: Never    Smokeless Tobacco Use: Never    Passive Exposure: Not on file  Financial Resource Strain: Low Risk (01/30/2024)   Overall Financial Resource Strain (CARDIA)    Difficulty of Paying Living Expenses: Not very hard  Food Insecurity: No Food Insecurity (01/30/2024)   Epic    Worried About Radiation Protection Practitioner of Food in the Last Year: Never true    Ran Out of Food in the Last Year: Never true  Recent Concern: Food Insecurity - Food Insecurity Present (12/10/2023)   Hunger Vital Sign    Worried About Running Out of Food in the  Last Year: Sometimes true    Ran Out of Food in the Last Year: Never true  Transportation Needs: No Transportation Needs (01/30/2024)   Epic    Lack of Transportation (Medical): No    Lack of Transportation (Non-Medical): No  Physical Activity: Sufficiently Active (01/30/2024)   Exercise Vital Sign    Days of Exercise per Week: 7 days    Minutes of Exercise per Session: 30 min  Recent Concern: Physical Activity - Insufficiently Active (12/10/2023)   Exercise Vital Sign    Days of Exercise per Week: 4 days    Minutes of Exercise per Session: 30 min  Stress: Stress Concern Present (01/30/2024)   [employer] of Occupational Health - Occupational Stress Questionnaire    Feeling of Stress: To some extent   Social Connections: Moderately Integrated (01/30/2024)   Social Connection and Isolation Panel    Frequency of Communication with Friends and Family: More than three times a week    Frequency of Social Gatherings with Friends and Family: Twice a week    Attends Religious Services: More than 4 times per year    Active Member of [employer] or Organizations: No    Attends Banker Meetings: Not on file    Marital Status: Married  Catering Manager Violence: Not At Risk (07/18/2022)   Humiliation, Afraid, Rape, and Kick questionnaire    Fear of Current or Ex-Partner: No    Emotionally Abused: No    Physically Abused: No    Sexually Abused: No  Depression (PHQ2-9): Low Risk (07/30/2024)   Depression (PHQ2-9)    PHQ-2 Score: 0  Alcohol Screen: Low Risk (07/18/2022)   Alcohol Screen    Last Alcohol Screening Score (AUDIT): 0  Housing: Low Risk (01/30/2024)   Epic    Unable to Pay for Housing in the Last Year: No    Number of Times Moved in the Last Year: 0    Homeless in the Last Year: No  Utilities: Not At Risk (07/18/2022)   AHC Utilities    Threatened with loss of utilities: No  Health Literacy: Not on file   Family History  Problem Relation Age of Onset   Alzheimer's disease Mother    Cancer Father        Prostate to bone   Diverticulitis Sister    Diabetes Son    Cancer Son    Heart disease Son    High blood pressure Son    Heart attack Son    Hypertension Son    ROS: All systems reviewed and negative except as noted in HPI.   Current Outpatient Medications  Medication Sig Dispense Refill   atorvastatin  (LIPITOR ) 40 MG tablet Take 1 tablet (40 mg total) by mouth daily. 90 tablet 1   calcium -vitamin D  (OSCAL WITH D) 500-5 MG-MCG tablet Take 1 tablet by mouth daily with breakfast.     carvedilol  (COREG ) 25 MG tablet Take 1 tablet (25 mg total) by mouth 2 (two) times daily with a meal. 180 tablet 3   cetirizine (ZYRTEC) 10 MG tablet Take 10 mg by mouth as needed for  allergies.     clopidogrel  (PLAVIX ) 75 MG tablet TAKE 1 TABLET BY MOUTH EVERY DAY 90 tablet 3   Cyanocobalamin  (VITAMIN B 12 PO) Take 1 tablet by mouth daily.     escitalopram  (LEXAPRO ) 10 MG tablet Take 1 tablet (10 mg total) by mouth daily. 30 tablet 0   FARXIGA  10 MG TABS tablet TAKE 1 TABLET BY MOUTH  EVERY DAY 30 tablet 2   furosemide  (LASIX ) 20 MG tablet TAKE 1 TABLET BY MOUTH EVERY DAY AS NEEDED 90 tablet 1   gabapentin  (NEURONTIN ) 300 MG capsule TAKE 1 CAPSULE BY MOUTH 2 TIMES DAILY AS NEEDED. 60 capsule 0   hydrOXYzine  (ATARAX ) 25 MG tablet TAKE 1-2 TABLETS AT BEDTIME FOR INSOMNIA. 180 tablet 1   Lancets (ONETOUCH DELICA PLUS LANCET33G) MISC Apply 1 each topically daily. 1 each 2   levothyroxine  (SYNTHROID ) 25 MCG tablet TAKE 1 TABLET BY MOUTH EVERY DAY 90 tablet 0   metFORMIN  (GLUCOPHAGE ) 500 MG tablet Take 2 tablets (1,000 mg total) by mouth 2 (two) times daily with a meal. 360 tablet 0   nitroGLYCERIN  (NITROSTAT ) 0.4 MG SL tablet PLACE 1 TABLET UNDER THE TONGUE EVERY 5 MINUTES X 3 DOSES AS NEEDED FOR CHEST PAIN. 25 tablet 0   ONETOUCH VERIO test strip CHECK BLOOD SUGAR ONCE DAILY OR AS DIRECTED 50 strip 0   pantoprazole  (PROTONIX ) 40 MG tablet Take 1 tablet (40 mg total) by mouth daily. 30 tablet 11   rOPINIRole  (REQUIP ) 4 MG tablet TAKE 1 TABLET BY MOUTH EVERYDAY AT BEDTIME 90 tablet 1   sacubitril -valsartan  (ENTRESTO ) 97-103 MG Take 1 tablet by mouth 2 (two) times daily. PLEASE SCHEDULE APPOINTMENT FOR MORE REFILLS [phone] OPTION 2 60 tablet 1   sodium chloride  (OCEAN) 0.65 % SOLN nasal spray Place 1 spray into both nostrils as needed for congestion. 30 mL 3   spironolactone  (ALDACTONE ) 25 MG tablet TAKE 1 TABLET (25 MG TOTAL) BY MOUTH DAILY. 90 tablet 0   No current facility-administered medications for this encounter.   Wt Readings from Last 3 Encounters:  08/20/24 75.1 kg (165 lb 9.6 oz)  07/30/24 79.4 kg (175 lb)  07/09/24 79.4 kg (175 lb)   BP 124/70   Pulse 74   Wt  75.1 kg (165 lb 9.6 oz)   SpO2 97%   BMI 29.33 kg/m  General: NAD Neck: No JVD, no thyromegaly or thyroid  nodule.  Lungs: Clear to auscultation bilaterally with normal respiratory effort. CV: Nondisplaced PMI.  Heart regular S1/S2, no S3/S4, no murmur.  No peripheral edema.  No carotid bruit.  Normal pedal pulses.  Abdomen: Soft, nontender, no hepatosplenomegaly, no distention.  Skin: Intact without lesions or rashes.  Neurologic: Alert and oriented x 3.  Psych: Normal affect. Extremities: No clubbing or cyanosis.  HEENT: Normal.   Assessment/Plan: 1. CAD: NSTEMI in 7/21 with DES x 2 to proximal LAD.  There was a chronic total occlusion of the RCA and moderate stenosis in the PLOM.  Cardiac PET completed 3/24, in setting of atypical chest pain, which showed normal EF, evidence for small area of ischemia in RCA territory but RCA known to be totally occluded. No ischemic chest pain. - Continue Plavix  75 mg daily.    - Continue atorvastatin  80 mg daily, good lipids in 12/25.  2. Chronic systolic CHF: Ischemic cardiomyopathy.  Echo in 7/21 with EF 25-30%, akinetic septum.  Echo in 10/21 with EF up to 55-60%.  Echo in 2/23 showed EF 55-60%, apical hypokinesis, normal RV, mild AI, IVC normal.  Normal EF on cardiac PET (3/24). She is not volume overloaded on exam, NYHA class I-II.   - Continue Entresto  97/103 bid.   - Continue Coreg  25 mg bid.  - Continue spironolactone  25 mg daily.  - Continue Farxiga  10 mg daily.  - With her current meds, would get BMET every 3-4 months.  3. PAD: ABIs (2/24)  normal, but TBIs abnormal suggesting some PAD distally. No resting leg pain or pedal ulcers. Previous leg pain resolved with addition of ropinirole . - Continue statin. 4. HTN: BP controlled.   Follow up in 6 months with APP  I spent 22 minutes reviewing records, interviewing/examining patient, and managing orders.   Ezra Shuck  08/21/2024

## 2024-08-31 ENCOUNTER — Other Ambulatory Visit (HOSPITAL_COMMUNITY): Payer: Self-pay | Admitting: Cardiology

## 2024-09-01 ENCOUNTER — Other Ambulatory Visit: Payer: Self-pay | Admitting: Family Medicine

## 2024-09-01 DIAGNOSIS — M2550 Pain in unspecified joint: Secondary | ICD-10-CM

## 2024-09-01 DIAGNOSIS — I5042 Chronic combined systolic (congestive) and diastolic (congestive) heart failure: Secondary | ICD-10-CM

## 2024-09-02 ENCOUNTER — Ambulatory Visit: Admitting: Family Medicine

## 2024-09-16 ENCOUNTER — Ambulatory Visit: Admitting: Family Medicine

## 2024-11-18 ENCOUNTER — Ambulatory Visit (HOSPITAL_COMMUNITY)

## 2025-01-07 ENCOUNTER — Ambulatory Visit: Admitting: Family Medicine

## 2025-02-18 ENCOUNTER — Ambulatory Visit (HOSPITAL_COMMUNITY)
